# Patient Record
Sex: Female | Born: 1935 | Race: Black or African American | Hispanic: No | Smoking: Never smoker
Health system: Southern US, Community
[De-identification: ages and names within clinical notes are randomized; demographics above are authoritative.]

## PROBLEM LIST (undated history)

## (undated) DIAGNOSIS — I10 Essential (primary) hypertension: Secondary | ICD-10-CM

## (undated) DIAGNOSIS — F32A Depression, unspecified: Secondary | ICD-10-CM

## (undated) DIAGNOSIS — D72819 Decreased white blood cell count, unspecified: Secondary | ICD-10-CM

## (undated) DIAGNOSIS — D539 Nutritional anemia, unspecified: Secondary | ICD-10-CM

## (undated) DIAGNOSIS — F329 Major depressive disorder, single episode, unspecified: Secondary | ICD-10-CM

## (undated) DIAGNOSIS — K219 Gastro-esophageal reflux disease without esophagitis: Secondary | ICD-10-CM

## (undated) DIAGNOSIS — D649 Anemia, unspecified: Secondary | ICD-10-CM

## (undated) HISTORY — DX: Essential (primary) hypertension: I10

## (undated) HISTORY — DX: Anemia, unspecified: D64.9

## (undated) HISTORY — DX: Gastro-esophageal reflux disease without esophagitis: K21.9

## (undated) HISTORY — DX: Depression, unspecified: F32.A

## (undated) HISTORY — DX: Major depressive disorder, single episode, unspecified: F32.9

---

## 2004-11-23 ENCOUNTER — Ambulatory Visit: Payer: Self-pay | Admitting: Internal Medicine

## 2006-01-25 ENCOUNTER — Ambulatory Visit: Payer: Self-pay | Admitting: Internal Medicine

## 2007-02-20 ENCOUNTER — Ambulatory Visit: Payer: Self-pay | Admitting: Internal Medicine

## 2015-09-09 DIAGNOSIS — Z23 Encounter for immunization: Secondary | ICD-10-CM | POA: Diagnosis not present

## 2015-12-01 DIAGNOSIS — R5383 Other fatigue: Secondary | ICD-10-CM | POA: Diagnosis not present

## 2015-12-01 DIAGNOSIS — Z23 Encounter for immunization: Secondary | ICD-10-CM | POA: Diagnosis not present

## 2015-12-01 DIAGNOSIS — I10 Essential (primary) hypertension: Secondary | ICD-10-CM | POA: Diagnosis not present

## 2015-12-01 DIAGNOSIS — Z0000 Encounter for general adult medical examination without abnormal findings: Secondary | ICD-10-CM | POA: Diagnosis not present

## 2015-12-08 DIAGNOSIS — R5383 Other fatigue: Secondary | ICD-10-CM | POA: Diagnosis not present

## 2015-12-08 DIAGNOSIS — I10 Essential (primary) hypertension: Secondary | ICD-10-CM | POA: Diagnosis not present

## 2015-12-08 DIAGNOSIS — Z23 Encounter for immunization: Secondary | ICD-10-CM | POA: Diagnosis not present

## 2015-12-08 DIAGNOSIS — Z0000 Encounter for general adult medical examination without abnormal findings: Secondary | ICD-10-CM | POA: Diagnosis not present

## 2016-06-29 DIAGNOSIS — I10 Essential (primary) hypertension: Secondary | ICD-10-CM | POA: Insufficient documentation

## 2016-06-29 DIAGNOSIS — F329 Major depressive disorder, single episode, unspecified: Secondary | ICD-10-CM | POA: Insufficient documentation

## 2016-06-29 DIAGNOSIS — F419 Anxiety disorder, unspecified: Secondary | ICD-10-CM

## 2016-06-29 DIAGNOSIS — Z636 Dependent relative needing care at home: Secondary | ICD-10-CM | POA: Insufficient documentation

## 2016-06-29 DIAGNOSIS — F32A Depression, unspecified: Secondary | ICD-10-CM | POA: Insufficient documentation

## 2016-06-29 DIAGNOSIS — Z0000 Encounter for general adult medical examination without abnormal findings: Secondary | ICD-10-CM | POA: Diagnosis not present

## 2016-06-29 DIAGNOSIS — F3341 Major depressive disorder, recurrent, in partial remission: Secondary | ICD-10-CM | POA: Diagnosis not present

## 2016-07-06 DIAGNOSIS — I10 Essential (primary) hypertension: Secondary | ICD-10-CM | POA: Diagnosis not present

## 2016-07-06 DIAGNOSIS — Z636 Dependent relative needing care at home: Secondary | ICD-10-CM | POA: Diagnosis not present

## 2016-07-06 DIAGNOSIS — F329 Major depressive disorder, single episode, unspecified: Secondary | ICD-10-CM | POA: Diagnosis not present

## 2016-07-06 DIAGNOSIS — F419 Anxiety disorder, unspecified: Secondary | ICD-10-CM | POA: Diagnosis not present

## 2016-07-10 ENCOUNTER — Other Ambulatory Visit: Payer: Self-pay | Admitting: Internal Medicine

## 2016-07-10 DIAGNOSIS — R634 Abnormal weight loss: Secondary | ICD-10-CM

## 2016-07-10 DIAGNOSIS — D649 Anemia, unspecified: Secondary | ICD-10-CM

## 2016-07-21 ENCOUNTER — Ambulatory Visit: Admission: RE | Admit: 2016-07-21 | Payer: Commercial Managed Care - HMO | Source: Ambulatory Visit

## 2016-07-21 ENCOUNTER — Other Ambulatory Visit: Payer: Commercial Managed Care - HMO

## 2016-07-27 ENCOUNTER — Ambulatory Visit
Admission: RE | Admit: 2016-07-27 | Discharge: 2016-07-27 | Disposition: A | Discharge status: Home / Self Care | Payer: Commercial Managed Care - HMO | Source: Ambulatory Visit | Attending: Internal Medicine | Admitting: Internal Medicine

## 2016-07-27 DIAGNOSIS — R634 Abnormal weight loss: Secondary | ICD-10-CM | POA: Diagnosis not present

## 2016-07-27 DIAGNOSIS — D649 Anemia, unspecified: Secondary | ICD-10-CM | POA: Insufficient documentation

## 2016-07-27 DIAGNOSIS — M5137 Other intervertebral disc degeneration, lumbosacral region: Secondary | ICD-10-CM | POA: Insufficient documentation

## 2016-07-27 DIAGNOSIS — R195 Other fecal abnormalities: Secondary | ICD-10-CM | POA: Insufficient documentation

## 2016-07-27 MED ORDER — IOPAMIDOL (ISOVUE-300) INJECTION 61%
100.0000 mL | Freq: Once | INTRAVENOUS | Status: AC | PRN
  Administered 2016-07-27: 100 mL via INTRAVENOUS

## 2016-08-02 DIAGNOSIS — Z23 Encounter for immunization: Secondary | ICD-10-CM | POA: Diagnosis not present

## 2016-10-26 DIAGNOSIS — F418 Other specified anxiety disorders: Secondary | ICD-10-CM | POA: Diagnosis not present

## 2016-10-26 DIAGNOSIS — D649 Anemia, unspecified: Secondary | ICD-10-CM | POA: Insufficient documentation

## 2016-10-26 DIAGNOSIS — L853 Xerosis cutis: Secondary | ICD-10-CM | POA: Diagnosis not present

## 2016-10-26 DIAGNOSIS — Z636 Dependent relative needing care at home: Secondary | ICD-10-CM | POA: Diagnosis not present

## 2016-10-26 DIAGNOSIS — I10 Essential (primary) hypertension: Secondary | ICD-10-CM | POA: Diagnosis not present

## 2017-03-02 DIAGNOSIS — Z636 Dependent relative needing care at home: Secondary | ICD-10-CM | POA: Diagnosis not present

## 2017-03-02 DIAGNOSIS — Z0000 Encounter for general adult medical examination without abnormal findings: Secondary | ICD-10-CM | POA: Diagnosis not present

## 2017-03-02 DIAGNOSIS — F419 Anxiety disorder, unspecified: Secondary | ICD-10-CM | POA: Diagnosis not present

## 2017-03-02 DIAGNOSIS — L282 Other prurigo: Secondary | ICD-10-CM | POA: Diagnosis not present

## 2017-03-02 DIAGNOSIS — D649 Anemia, unspecified: Secondary | ICD-10-CM | POA: Diagnosis not present

## 2017-03-02 DIAGNOSIS — F329 Major depressive disorder, single episode, unspecified: Secondary | ICD-10-CM | POA: Diagnosis not present

## 2017-03-02 DIAGNOSIS — I10 Essential (primary) hypertension: Secondary | ICD-10-CM | POA: Diagnosis not present

## 2017-03-05 DIAGNOSIS — I10 Essential (primary) hypertension: Secondary | ICD-10-CM | POA: Diagnosis not present

## 2017-03-05 DIAGNOSIS — Z636 Dependent relative needing care at home: Secondary | ICD-10-CM | POA: Diagnosis not present

## 2017-03-05 DIAGNOSIS — D582 Other hemoglobinopathies: Secondary | ICD-10-CM | POA: Diagnosis not present

## 2017-03-05 DIAGNOSIS — F419 Anxiety disorder, unspecified: Secondary | ICD-10-CM | POA: Diagnosis not present

## 2017-03-05 DIAGNOSIS — D649 Anemia, unspecified: Secondary | ICD-10-CM | POA: Diagnosis not present

## 2017-03-05 DIAGNOSIS — L282 Other prurigo: Secondary | ICD-10-CM | POA: Diagnosis not present

## 2017-03-05 DIAGNOSIS — Z0000 Encounter for general adult medical examination without abnormal findings: Secondary | ICD-10-CM | POA: Diagnosis not present

## 2017-03-05 DIAGNOSIS — F329 Major depressive disorder, single episode, unspecified: Secondary | ICD-10-CM | POA: Diagnosis not present

## 2017-03-13 ENCOUNTER — Inpatient Hospital Stay: Payer: Medicare HMO | Admitting: Hematology and Oncology

## 2017-11-02 DIAGNOSIS — I10 Essential (primary) hypertension: Secondary | ICD-10-CM | POA: Diagnosis not present

## 2017-11-02 DIAGNOSIS — D649 Anemia, unspecified: Secondary | ICD-10-CM | POA: Diagnosis not present

## 2017-11-02 DIAGNOSIS — K219 Gastro-esophageal reflux disease without esophagitis: Secondary | ICD-10-CM | POA: Diagnosis not present

## 2017-11-15 NOTE — Progress Notes (Signed)
Hematology/Oncology Consult note [facility] Telephone:([phone] [phone]   Patient Care Team: Barbette Reichmann, MD as PCP - General (Internal Medicine)  REFERRING PROVIDER: Barbette Reichmann, MD and Debbra Riding CHIEF COMPLAINTS/PURPOSE OF CONSULTATION:  Evaluation of low WBC and anemia.   HISTORY OF PRESENTING ILLNESS:  Martha Butler is a  83 y.o.  female with PMH listed below who was referred to me for evaluation of low white count and anemia. Patient reports that she is been feeling tired recently quite stressed. Her husband passed away approximately 1 month ago. She lives by herself. Doesn't have good appetite. Denies any fever or chills, bleeding, chest pain, shortness of breath, abdominal pain. She reports that she may have lost weight since she's not been eating very well recently. Patient is accompanied by her grandson to clinic today.   Review of Systems  Constitutional: Negative for chills and fever.  HENT: Negative for hearing loss.   Eyes: Negative for blurred vision and photophobia.  Respiratory: Negative for cough.   Cardiovascular: Negative for chest pain and orthopnea.  Gastrointestinal: Negative for heartburn, nausea and vomiting.  Genitourinary: Negative for dysuria.  Musculoskeletal: Negative for myalgias.  Skin: Negative for rash.  Neurological: Negative for dizziness.  Endo/Heme/Allergies: Does not bruise/bleed easily.  Psychiatric/Behavioral: The patient does not have insomnia.        Feels not having much interest of doing anything.    MEDICAL HISTORY:  Past Medical History:  Diagnosis Date  . Anemia   . Depression   . GERD (gastroesophageal reflux disease)   . Hypertension     SURGICAL HISTORY: History reviewed. No pertinent surgical history.  SOCIAL HISTORY: Social History   Socioeconomic History  . Marital status: Married    Spouse name: Not on file  . Number of children: Not on file  . Years of  education: Not on file  . Highest education level: Not on file  Social Needs  . Financial resource strain: Not on file  . Food insecurity - worry: Not on file  . Food insecurity - inability: Not on file  . Transportation needs - medical: Not on file  . Transportation needs - non-medical: Not on file  Occupational History  . Not on file  Tobacco Use  . Smoking status: Never Smoker  . Smokeless tobacco: Never Used  Substance and Sexual Activity  . Alcohol use: No    Frequency: Never  . Drug use: No  . Sexual activity: Not on file  Other Topics Concern  . Not on file  Social History Narrative  . Not on file    FAMILY HISTORY: History reviewed. No pertinent family history.  ALLERGIES:  has No Known Allergies.  MEDICATIONS:  Current Outpatient Medications  Medication Sig Dispense Refill  . aspirin EC 81 MG tablet Take 1 tablet by mouth daily.    Marland Kitchen omeprazole (PRILOSEC) 20 MG capsule Take 1 capsule by mouth daily.     No current facility-administered medications for this visit.      PHYSICAL EXAMINATION: ECOG PERFORMANCE STATUS: 1 - Symptomatic but completely ambulatory Vitals:   11/16/17 1040 11/16/17 1049  BP:  133/70  Pulse:  91  Resp: 16   Temp:  98.8 F (37.1 C)   Filed Weights   11/16/17 1040  Weight: 136 lb 12.8 oz (62.1 kg)    Physical Exam  Constitutional: She is oriented to person, place, and time and well-developed, well-nourished, and in no distress. No distress.  HENT:  Head:  Normocephalic and atraumatic.  Mouth/Throat: No oropharyngeal exudate.  Eyes: EOM are normal. Pupils are equal, round, and reactive to light. No scleral icterus.  Neck: Normal range of motion. Neck supple.  Cardiovascular: Normal rate and regular rhythm.  Systolic murmur  Pulmonary/Chest: Effort normal and breath sounds normal.  Abdominal: Soft. Bowel sounds are normal. She exhibits no distension.  Musculoskeletal: Normal range of motion. She exhibits no edema.    Lymphadenopathy:    She has no cervical adenopathy.  Neurological: She is alert and oriented to person, place, and time.  Skin: Skin is warm and dry.  Psychiatric: Affect normal.     LABORATORY DATA:  I have reviewed the data as listed Lab Results  Component Value Date   WBC 2.0 (L) 11/16/2017   HGB 8.1 (L) 11/16/2017   HCT 24.9 (L) 11/16/2017   MCV 99.4 11/16/2017   PLT 200 11/16/2017   No results for input(s): NA, K, CL, CO2, GLUCOSE, BUN, CREATININE, CALCIUM, GFRNONAA, GFRAA, PROT, ALBUMIN, AST, ALT, ALKPHOS, BILITOT, BILIDIR, IBILI in the last 8760 hours.     ASSESSMENT & PLAN:  1. Anemia, unspecified type   2. Neutropenia, unspecified type (HCC)   3. Lymphopenia    Anemia: multifactorial with possible causes including chronic blood loss, hyper/hypothyroidism, nutritional deficiency, infection/chronic inflammation, hemolysis, underlying bone marrow disorders. Will check CBC w differential, CMP, vitamin B12, Folate, iron/TIBC, ferritin, reticulocytes, fecal occult, UA, blood smear, TSH,  LDH, monoclonal gammopathy evaluation.    Neutropenia/Lymphopenia: check HIV, hepatitis, flowcytometry.   All questions were answered. The patient knows to call the clinic with any problems questions or concerns.  Return of visit: 7-10 days to discuss results.  Thank you for this kind referral and the opportunity to participate in the care of this patient. A copy of today's note is routed to referring provider    Rickard PatienceZhou Demani Weyrauch, MD, PhD Hematology Oncology [facility] Pager- [phone] 11/16/2017

## 2017-11-16 ENCOUNTER — Inpatient Hospital Stay: Payer: Medicare HMO | Attending: Oncology | Admitting: Oncology

## 2017-11-16 ENCOUNTER — Inpatient Hospital Stay: Payer: Medicare HMO | Admitting: *Deleted

## 2017-11-16 ENCOUNTER — Other Ambulatory Visit: Payer: Self-pay

## 2017-11-16 ENCOUNTER — Encounter: Payer: Self-pay | Admitting: Oncology

## 2017-11-16 VITALS — BP 133/70 | HR 91 | Temp 98.8°F | Resp 16 | Ht 67.0 in | Wt 136.8 lb

## 2017-11-16 DIAGNOSIS — Z7982 Long term (current) use of aspirin: Secondary | ICD-10-CM | POA: Diagnosis not present

## 2017-11-16 DIAGNOSIS — E039 Hypothyroidism, unspecified: Secondary | ICD-10-CM | POA: Diagnosis not present

## 2017-11-16 DIAGNOSIS — D72810 Lymphocytopenia: Secondary | ICD-10-CM | POA: Diagnosis not present

## 2017-11-16 DIAGNOSIS — D649 Anemia, unspecified: Secondary | ICD-10-CM

## 2017-11-16 DIAGNOSIS — F329 Major depressive disorder, single episode, unspecified: Secondary | ICD-10-CM | POA: Insufficient documentation

## 2017-11-16 DIAGNOSIS — Z79899 Other long term (current) drug therapy: Secondary | ICD-10-CM | POA: Diagnosis not present

## 2017-11-16 DIAGNOSIS — I10 Essential (primary) hypertension: Secondary | ICD-10-CM | POA: Diagnosis not present

## 2017-11-16 DIAGNOSIS — D709 Neutropenia, unspecified: Secondary | ICD-10-CM | POA: Insufficient documentation

## 2017-11-16 DIAGNOSIS — R5383 Other fatigue: Secondary | ICD-10-CM | POA: Insufficient documentation

## 2017-11-16 LAB — FOLATE: FOLATE: 24 ng/mL (ref >5.9)

## 2017-11-16 LAB — FERRITIN: FERRITIN: 100 ng/mL (ref 11–307)

## 2017-11-16 LAB — COMPREHENSIVE METABOLIC PANEL
ALBUMIN: 4.2 g/dL (ref 3.5–5.0)
ALT: 14 U/L (ref 14–54)
ANION GAP: 10 (ref 5–15)
AST: 21 U/L (ref 15–41)
Alkaline Phosphatase: 55 U/L (ref 38–126)
BUN: 17 mg/dL (ref 6–20)
CO2: 27 mmol/L (ref 22–32)
Calcium: 9.2 mg/dL (ref 8.9–10.3)
Chloride: 104 mmol/L (ref 101–111)
Creatinine, Ser: 1.06 mg/dL — ABNORMAL HIGH (ref 0.44–1.00)
GFR calc Af Amer: 56 mL/min — ABNORMAL LOW (ref >60)
GFR calc non Af Amer: 48 mL/min — ABNORMAL LOW (ref >60)
GLUCOSE: 102 mg/dL — AB (ref 65–99)
POTASSIUM: 3.9 mmol/L (ref 3.5–5.1)
Sodium: 141 mmol/L (ref 135–145)
TOTAL PROTEIN: 8.2 g/dL — AB (ref 6.5–8.1)
Total Bilirubin: 1 mg/dL (ref 0.3–1.2)

## 2017-11-16 LAB — IRON AND TIBC
Iron: 94 ug/dL (ref 28–170)
Saturation Ratios: 29 % (ref 10.4–31.8)
TIBC: 322 ug/dL (ref 250–450)
UIBC: 228 ug/dL

## 2017-11-16 LAB — CBC WITH DIFFERENTIAL/PLATELET
BASOS ABS: 0.1 10*3/uL (ref 0–0.1)
Band Neutrophils: 8 %
Basophils Relative: 4 %
EOS ABS: 0 10*3/uL (ref 0–0.7)
Eosinophils Relative: 1 %
HCT: 24.9 % — ABNORMAL LOW (ref 35.0–47.0)
Hemoglobin: 8.1 g/dL — ABNORMAL LOW (ref 12.0–16.0)
LYMPHS ABS: 0.7 10*3/uL — AB (ref 1.0–3.6)
Lymphocytes Relative: 36 %
MCH: 32.4 pg (ref 26.0–34.0)
MCHC: 32.6 g/dL (ref 32.0–36.0)
MCV: 99.4 fL (ref 80.0–100.0)
MONO ABS: 0.1 10*3/uL — AB (ref 0.2–0.9)
Monocytes Relative: 7 %
NEUTROS ABS: 1.1 10*3/uL — AB (ref 1.4–6.5)
NEUTROS PCT: 44 %
Platelets: 200 10*3/uL (ref 150–440)
RBC: 2.5 MIL/uL — ABNORMAL LOW (ref 3.80–5.20)
RDW: 15.3 % — AB (ref 11.5–14.5)
WBC: 2 10*3/uL — AB (ref 3.6–11.0)

## 2017-11-16 LAB — PATHOLOGIST SMEAR REVIEW

## 2017-11-16 LAB — TSH: TSH: 2.627 u[IU]/mL (ref 0.350–4.500)

## 2017-11-16 LAB — LACTATE DEHYDROGENASE: LDH: 133 U/L (ref 98–192)

## 2017-11-16 NOTE — Progress Notes (Signed)
Patient here for initial visit. She reports feeling tired at times, denies dyspnea,.

## 2017-11-17 LAB — HEPATITIS PANEL, ACUTE
HCV Ab: <0.1 s/co ratio (ref 0.0–0.9)
Hep A IgM: NEGATIVE
Hep B C IgM: NEGATIVE
Hepatitis B Surface Ag: NEGATIVE

## 2017-11-17 LAB — HIV ANTIBODY (ROUTINE TESTING W REFLEX): HIV Screen 4th Generation wRfx: NONREACTIVE

## 2017-11-17 LAB — VITAMIN B12: VITAMIN B 12: 828 pg/mL (ref 180–914)

## 2017-11-19 LAB — COMP PANEL: LEUKEMIA/LYMPHOMA

## 2017-11-19 LAB — KAPPA/LAMBDA LIGHT CHAINS
KAPPA, LAMDA LIGHT CHAIN RATIO: 1.75 — AB (ref 0.26–1.65)
Kappa free light chain: 55.3 mg/L — ABNORMAL HIGH (ref 3.3–19.4)
LAMDA FREE LIGHT CHAINS: 31.6 mg/L — AB (ref 5.7–26.3)

## 2017-11-20 LAB — PROTEIN ELECTROPHORESIS, SERUM
A/G RATIO SPE: 0.9 (ref 0.7–1.7)
ALBUMIN ELP: 3.8 g/dL (ref 2.9–4.4)
Alpha-1-Globulin: 0.3 g/dL (ref 0.0–0.4)
Alpha-2-Globulin: 0.6 g/dL (ref 0.4–1.0)
BETA GLOBULIN: 1.2 g/dL (ref 0.7–1.3)
GLOBULIN, TOTAL: 4.1 g/dL — AB (ref 2.2–3.9)
Gamma Globulin: 2 g/dL — ABNORMAL HIGH (ref 0.4–1.8)
TOTAL PROTEIN ELP: 7.9 g/dL (ref 6.0–8.5)

## 2017-11-27 ENCOUNTER — Other Ambulatory Visit: Payer: Self-pay

## 2017-11-27 ENCOUNTER — Encounter: Payer: Self-pay | Admitting: Oncology

## 2017-11-27 ENCOUNTER — Other Ambulatory Visit: Payer: Self-pay | Admitting: *Deleted

## 2017-11-27 ENCOUNTER — Inpatient Hospital Stay (HOSPITAL_BASED_OUTPATIENT_CLINIC_OR_DEPARTMENT_OTHER): Payer: Medicare HMO | Admitting: Oncology

## 2017-11-27 VITALS — BP 129/66 | HR 87 | Temp 98.7°F | Wt 135.4 lb

## 2017-11-27 DIAGNOSIS — F329 Major depressive disorder, single episode, unspecified: Secondary | ICD-10-CM | POA: Diagnosis not present

## 2017-11-27 DIAGNOSIS — D709 Neutropenia, unspecified: Secondary | ICD-10-CM | POA: Diagnosis not present

## 2017-11-27 DIAGNOSIS — D72810 Lymphocytopenia: Secondary | ICD-10-CM | POA: Diagnosis not present

## 2017-11-27 DIAGNOSIS — D649 Anemia, unspecified: Secondary | ICD-10-CM

## 2017-11-27 DIAGNOSIS — I10 Essential (primary) hypertension: Secondary | ICD-10-CM | POA: Diagnosis not present

## 2017-11-27 DIAGNOSIS — Z79899 Other long term (current) drug therapy: Secondary | ICD-10-CM | POA: Diagnosis not present

## 2017-11-27 DIAGNOSIS — Z7982 Long term (current) use of aspirin: Secondary | ICD-10-CM | POA: Diagnosis not present

## 2017-11-27 DIAGNOSIS — E039 Hypothyroidism, unspecified: Secondary | ICD-10-CM | POA: Diagnosis not present

## 2017-11-27 DIAGNOSIS — R5383 Other fatigue: Secondary | ICD-10-CM | POA: Diagnosis not present

## 2017-11-27 LAB — URINALYSIS, COMPLETE (UACMP) WITH MICROSCOPIC
BILIRUBIN URINE: NEGATIVE
Glucose, UA: NEGATIVE mg/dL
Hgb urine dipstick: NEGATIVE
KETONES UR: NEGATIVE mg/dL
NITRITE: NEGATIVE
Protein, ur: NEGATIVE mg/dL
SPECIFIC GRAVITY, URINE: 1.011 (ref 1.005–1.030)
pH: 5 (ref 5.0–8.0)

## 2017-11-27 NOTE — Progress Notes (Signed)
Patient here today for follow up.  Patient states no new concerns today  

## 2017-11-27 NOTE — Progress Notes (Signed)
Hematology/Oncology Follow up note [facility] Telephone:([phone] Fax:(336) [phone]   Patient Care Team: Tracie Harrier, MD as PCP - General (Internal Medicine)  REFERRING PROVIDER: Tracie Harrier, MD and Whitaker, [place] VISIT Follow up for management of  low WBC and anemia.   HISTORY OF PRESENTING ILLNESS:  Martha Butler is a  83 y.o.  female with PMH listed below who was referred to me for evaluation of low white count and anemia. Patient reports that she is been feeling tired recently quite stressed. Her husband passed away approximately 1 month ago. She lives by herself. Doesn't have good appetite.   INTERVAL HISTORY Martha Butler is a 83 y.o. female who has above history reviewed by me today presents for follow up visit for management of cytopenia. She is accompanied by her grandson.  She denies any new complaints. Denies any fever or chills, bleeding, chest pain, shortness of breath, abdominal pain. She reports that she may have lost weight since she's not been eating very well recently. Appetite is fair.  Review of Systems  Constitutional: Negative for chills and fever.  HENT: Negative for hearing loss.   Eyes: Negative for blurred vision and photophobia.  Respiratory: Negative for cough.   Cardiovascular: Negative for chest pain and orthopnea.  Gastrointestinal: Negative for heartburn, nausea and vomiting.  Genitourinary: Negative for dysuria.  Musculoskeletal: Negative for myalgias.  Skin: Negative for rash.  Neurological: Negative for dizziness and tingling.  Endo/Heme/Allergies: Does not bruise/bleed easily.  Psychiatric/Behavioral: The patient does not have insomnia.        Feels not having much interest of doing anything.    MEDICAL HISTORY:  Past Medical History:  Diagnosis Date  . Anemia   . Depression   . GERD (gastroesophageal reflux disease)   . Hypertension     SURGICAL HISTORY: History reviewed. No  pertinent surgical history.  SOCIAL HISTORY: Social History   Socioeconomic History  . Marital status: Married    Spouse name: Not on file  . Number of children: Not on file  . Years of education: Not on file  . Highest education level: Not on file  Social Needs  . Financial resource strain: Not on file  . Food insecurity - worry: Not on file  . Food insecurity - inability: Not on file  . Transportation needs - medical: Not on file  . Transportation needs - non-medical: Not on file  Occupational History  . Not on file  Tobacco Use  . Smoking status: Never Smoker  . Smokeless tobacco: Never Used  Substance and Sexual Activity  . Alcohol use: No    Frequency: Never  . Drug use: No  . Sexual activity: Not on file  Other Topics Concern  . Not on file  Social History Narrative  . Not on file    FAMILY HISTORY: History reviewed. No pertinent family history.  ALLERGIES:  has No Known Allergies.  MEDICATIONS:  Current Outpatient Medications  Medication Sig Dispense Refill  . aspirin EC 81 MG tablet Take 1 tablet by mouth daily.    Marland Kitchen omeprazole (PRILOSEC) 20 MG capsule Take 1 capsule by mouth daily.     No current facility-administered medications for this visit.      PHYSICAL EXAMINATION: ECOG PERFORMANCE STATUS: 1 - Symptomatic but completely ambulatory Vitals:   11/27/17 1149  BP: 129/66  Pulse: 87  Temp: 98.7 F (37.1 C)   Filed Weights   11/27/17 1149  Weight: 135 lb 6 oz (  61.4 kg)    Physical Exam  Constitutional: She is oriented to person, place, and time and well-developed, well-nourished, and in no distress. No distress.  HENT:  Head: Normocephalic and atraumatic.  Mouth/Throat: No oropharyngeal exudate.  Eyes: EOM are normal. Pupils are equal, round, and reactive to light. Right eye exhibits no discharge. No scleral icterus.  Neck: Normal range of motion. Neck supple.  Cardiovascular: Normal rate, regular rhythm and normal heart sounds.  No  murmur heard. Systolic murmur  Pulmonary/Chest: Effort normal and breath sounds normal. She has no wheezes.  Abdominal: Soft. Bowel sounds are normal. She exhibits no distension.  Musculoskeletal: Normal range of motion. She exhibits no edema.  Lymphadenopathy:    She has no cervical adenopathy.  Neurological: She is alert and oriented to person, place, and time.  Skin: Skin is warm and dry. No erythema.  Psychiatric: Affect and judgment normal.     LABORATORY DATA:  I have reviewed the data as listed Lab Results  Component Value Date   WBC 2.0 (L) 11/16/2017   HGB 8.1 (L) 11/16/2017   HCT 24.9 (L) 11/16/2017   MCV 99.4 11/16/2017   PLT 200 11/16/2017   Recent Labs    11/16/17 1202  NA 141  K 3.9  CL 104  CO2 27  GLUCOSE 102*  BUN 17  CREATININE 1.06*  CALCIUM 9.2  GFRNONAA 48*  GFRAA 56*  PROT 8.2*  ALBUMIN 4.2  AST 21  ALT 14  ALKPHOS 55  BILITOT 1.0    Normal vitamin B12, Folate, iron/TIBC, ferritin, TSH normal. No monoclonal protein. Normal LDH, TSH.   ASSESSMENT & PLAN:  1. Anemia, unspecified type   2. Neutropenia, unspecified type ([place])   3. Lymphopenia    Discussed with patient and her grandson that so far peripheral blood work up is non conclusive.  Likely patient has underlying bone marrow disorder, ie MDS.  Discussed with patient that to better evaluate bone marrow, obtaining bone marrow biopsy is recommend, however, even if we confirmed that she has MDS, treatment plan will be mostly supportive care +/- hypomethylation agent.   Patient does not want to have bone marrow biopsy which I think is reasonable. She is asymptomatic for now. She can continue to follow up with me every 3 months and if her cytopenia continue to worsen, can consider G CSFs.   All questions were answered. The patient knows to call the clinic with any problems questions or concerns.  Return of visit: 3 months with repeat cbc.  Thank you for this kind referral and the  opportunity to participate in the care of this patient. A copy of today's note is routed to referring provider    Earlie Server, MD, PhD Hematology Oncology [facility] at [facility] Pager- [phone] 11/27/2017

## 2018-01-15 ENCOUNTER — Other Ambulatory Visit: Payer: Self-pay | Admitting: Internal Medicine

## 2018-01-15 DIAGNOSIS — D649 Anemia, unspecified: Secondary | ICD-10-CM | POA: Diagnosis not present

## 2018-01-15 DIAGNOSIS — Z0000 Encounter for general adult medical examination without abnormal findings: Secondary | ICD-10-CM | POA: Diagnosis not present

## 2018-01-15 DIAGNOSIS — Z1231 Encounter for screening mammogram for malignant neoplasm of breast: Secondary | ICD-10-CM | POA: Diagnosis not present

## 2018-01-15 DIAGNOSIS — I499 Cardiac arrhythmia, unspecified: Secondary | ICD-10-CM | POA: Diagnosis not present

## 2018-01-15 DIAGNOSIS — F321 Major depressive disorder, single episode, moderate: Secondary | ICD-10-CM | POA: Diagnosis not present

## 2018-01-15 DIAGNOSIS — Z780 Asymptomatic menopausal state: Secondary | ICD-10-CM | POA: Diagnosis not present

## 2018-01-15 DIAGNOSIS — Z1239 Encounter for other screening for malignant neoplasm of breast: Secondary | ICD-10-CM

## 2018-01-18 DIAGNOSIS — I499 Cardiac arrhythmia, unspecified: Secondary | ICD-10-CM | POA: Diagnosis not present

## 2018-01-18 DIAGNOSIS — Z780 Asymptomatic menopausal state: Secondary | ICD-10-CM | POA: Diagnosis not present

## 2018-01-18 DIAGNOSIS — Z1231 Encounter for screening mammogram for malignant neoplasm of breast: Secondary | ICD-10-CM | POA: Diagnosis not present

## 2018-01-18 DIAGNOSIS — D649 Anemia, unspecified: Secondary | ICD-10-CM | POA: Diagnosis not present

## 2018-01-18 DIAGNOSIS — F321 Major depressive disorder, single episode, moderate: Secondary | ICD-10-CM | POA: Diagnosis not present

## 2018-01-18 DIAGNOSIS — Z0000 Encounter for general adult medical examination without abnormal findings: Secondary | ICD-10-CM | POA: Diagnosis not present

## 2018-01-21 DIAGNOSIS — D649 Anemia, unspecified: Secondary | ICD-10-CM | POA: Diagnosis not present

## 2018-01-23 DIAGNOSIS — M8588 Other specified disorders of bone density and structure, other site: Secondary | ICD-10-CM | POA: Diagnosis not present

## 2018-02-26 ENCOUNTER — Other Ambulatory Visit: Payer: Self-pay

## 2018-02-26 ENCOUNTER — Inpatient Hospital Stay (HOSPITAL_BASED_OUTPATIENT_CLINIC_OR_DEPARTMENT_OTHER): Payer: Medicare HMO | Admitting: Oncology

## 2018-02-26 ENCOUNTER — Inpatient Hospital Stay: Payer: Medicare HMO | Attending: Oncology

## 2018-02-26 ENCOUNTER — Other Ambulatory Visit
Admission: RE | Admit: 2018-02-26 | Discharge: 2018-02-26 | Disposition: A | Discharge status: Home / Self Care | Payer: Medicare HMO | Source: Ambulatory Visit | Attending: Oncology | Admitting: Oncology

## 2018-02-26 ENCOUNTER — Encounter: Payer: Self-pay | Admitting: Oncology

## 2018-02-26 VITALS — BP 126/70 | HR 93 | Temp 97.2°F | Resp 18 | Wt 142.0 lb

## 2018-02-26 DIAGNOSIS — D709 Neutropenia, unspecified: Secondary | ICD-10-CM | POA: Insufficient documentation

## 2018-02-26 DIAGNOSIS — D649 Anemia, unspecified: Secondary | ICD-10-CM | POA: Diagnosis not present

## 2018-02-26 DIAGNOSIS — D72810 Lymphocytopenia: Secondary | ICD-10-CM | POA: Insufficient documentation

## 2018-02-26 DIAGNOSIS — I10 Essential (primary) hypertension: Secondary | ICD-10-CM | POA: Insufficient documentation

## 2018-02-26 LAB — CBC WITH DIFFERENTIAL/PLATELET
BASOS PCT: 0 %
Band Neutrophils: 2 %
Basophils Absolute: 0 10*3/uL (ref 0–0.1)
Blasts: 0 %
EOS PCT: 1 %
Eosinophils Absolute: 0 10*3/uL (ref 0–0.7)
HCT: 28.1 % — ABNORMAL LOW (ref 35.0–47.0)
Hemoglobin: 9.2 g/dL — ABNORMAL LOW (ref 12.0–16.0)
LYMPHS ABS: 1.2 10*3/uL (ref 1.0–3.6)
LYMPHS PCT: 54 %
MCH: 32.8 pg (ref 26.0–34.0)
MCHC: 32.8 g/dL (ref 32.0–36.0)
MCV: 99.9 fL (ref 80.0–100.0)
MONO ABS: 0.3 10*3/uL (ref 0.2–0.9)
MONOS PCT: 12 %
Metamyelocytes Relative: 0 %
Myelocytes: 0 %
NEUTROS ABS: 0.8 10*3/uL — AB (ref 1.4–6.5)
NEUTROS PCT: 31 %
NRBC: 0 /100{WBCs}
OTHER: 0 %
PLATELETS: 208 10*3/uL (ref 150–440)
Promyelocytes Relative: 0 %
RBC: 2.81 MIL/uL — AB (ref 3.80–5.20)
RDW: 15.1 % — AB (ref 11.5–14.5)
WBC: 2.3 10*3/uL — AB (ref 3.6–11.0)

## 2018-02-26 MED ORDER — CIPROFLOXACIN HCL 250 MG PO TABS: 250.0000 mg | ORAL_TABLET | Freq: Two times a day (BID) | ORAL | 1 refills | Status: DC

## 2018-02-26 NOTE — Progress Notes (Signed)
Patient here for follow up. No concerns voiced.  °

## 2018-02-26 NOTE — Progress Notes (Signed)
Hematology/Oncology Follow up note [name] Telephone:([phone] Fax:(336) [phone]   Patient Care Team: Tracie Harrier, MD as PCP - General (Internal Medicine)  REFERRING PROVIDER: Tracie Harrier, MD and Whitaker, [place] VISIT Follow up for management of leukopenia and anemia.   HISTORY OF PRESENTING ILLNESS:  Martha Butler is a  83 y.o.  female with PMH listed below who was referred to me for evaluation of low white count and anemia. Patient reports that she is been feeling tired recently quite stressed. Her husband passed away approximately 1 month ago. She lives by herself. Doesn't have good appetite.   INTERVAL HISTORY ADDELINE CALARCO is a 83 y.o. female who has above history reviewed by me today presents for follow up visit for management of cytopenia.  Patient is accompanied by her grandson.  Denies any new complaints.  Denies any fever or chills, bleeding, chest pain, shortness of breath, abdominal pain.  Denies any frequent infection, bleeding events.  Appetite is fair.  Review of Systems  Constitutional: Positive for malaise/fatigue. Negative for chills, fever and weight loss.  HENT: Negative for congestion, ear discharge, ear pain, hearing loss, nosebleeds, sinus pain and sore throat.   Eyes: Negative for blurred vision, double vision, photophobia, pain, discharge and redness.  Respiratory: Negative for cough, hemoptysis, sputum production, shortness of breath and wheezing.   Cardiovascular: Negative for chest pain, palpitations, orthopnea, claudication and leg swelling.  Gastrointestinal: Negative for abdominal pain, blood in stool, constipation, diarrhea, heartburn, melena, nausea and vomiting.  Genitourinary: Negative for dysuria, flank pain, frequency and hematuria.  Musculoskeletal: Negative for back pain, myalgias and neck pain.  Skin: Negative for itching and rash.  Neurological: Negative for dizziness, tingling, tremors,  focal weakness, weakness and headaches.  Endo/Heme/Allergies: Negative for environmental allergies. Does not bruise/bleed easily.  Psychiatric/Behavioral: Negative for depression, hallucinations, substance abuse and suicidal ideas. The patient is not nervous/anxious and does not have insomnia.        Feels not having much interest of doing anything.    MEDICAL HISTORY:  Past Medical History:  Diagnosis Date  . Anemia   . Depression   . GERD (gastroesophageal reflux disease)   . Hypertension     SURGICAL HISTORY: No past surgical history on file.  SOCIAL HISTORY: Social History   Socioeconomic History  . Marital status: Married    Spouse name: Not on file  . Number of children: Not on file  . Years of education: Not on file  . Highest education level: Not on file  Occupational History  . Not on file  Social Needs  . Financial resource strain: Not on file  . Food insecurity:    Worry: Not on file    Inability: Not on file  . Transportation needs:    Medical: Not on file    Non-medical: Not on file  Tobacco Use  . Smoking status: Never Smoker  . Smokeless tobacco: Never Used  Substance and Sexual Activity  . Alcohol use: No    Frequency: Never  . Drug use: No  . Sexual activity: Not on file  Lifestyle  . Physical activity:    Days per week: Not on file    Minutes per session: Not on file  . Stress: Not on file  Relationships  . Social connections:    Talks on phone: Not on file    Gets together: Not on file    Attends religious service: Not on file    Active member  of club or organization: Not on file    Attends meetings of clubs or organizations: Not on file    Relationship status: Not on file  . Intimate partner violence:    Fear of current or ex partner: Not on file    Emotionally abused: Not on file    Physically abused: Not on file    Forced sexual activity: Not on file  Other Topics Concern  . Not on file  Social History Narrative  . Not on file     FAMILY HISTORY: No family history on file.  ALLERGIES:  has No Known Allergies.  MEDICATIONS:  Current Outpatient Medications  Medication Sig Dispense Refill  . aspirin EC 81 MG tablet Take 1 tablet by mouth daily.    Marland Kitchen omeprazole (PRILOSEC) 20 MG capsule Take 1 capsule by mouth daily.     No current facility-administered medications for this visit.      PHYSICAL EXAMINATION: ECOG PERFORMANCE STATUS: 1 - Symptomatic but completely ambulatory Vitals:   02/26/18 1150 02/26/18 1152  BP: 126/70   Pulse: 93   Resp: 18   Temp:  (!) 97.2 F (36.2 C)   Filed Weights   02/26/18 1150  Weight: 142 lb (64.4 kg)    Physical Exam  Constitutional: She is oriented to person, place, and time and well-developed, well-nourished, and in no distress. No distress.  HENT:  Head: Normocephalic and atraumatic.  Nose: Nose normal.  Mouth/Throat: Oropharynx is clear and moist. No oropharyngeal exudate.  Eyes: Pupils are equal, round, and reactive to light. EOM are normal. Right eye exhibits no discharge. Left eye exhibits no discharge. No scleral icterus.  Neck: Normal range of motion. Neck supple. No JVD present.  Cardiovascular: Normal rate and regular rhythm.  Murmur heard. Systolic murmur  Pulmonary/Chest: Effort normal and breath sounds normal. No respiratory distress. She has no wheezes. She has no rales. She exhibits no tenderness.  Abdominal: Soft. Bowel sounds are normal. She exhibits no distension and no mass. There is no tenderness. There is no rebound.  Musculoskeletal: Normal range of motion. She exhibits no edema or tenderness.  Lymphadenopathy:    She has no cervical adenopathy.  Neurological: She is alert and oriented to person, place, and time. No cranial nerve deficit. She exhibits normal muscle tone. Coordination normal.  Skin: Skin is warm and dry. No rash noted. She is not diaphoretic. No erythema.  Psychiatric: Affect and judgment normal.     LABORATORY DATA:  I  have reviewed the data as listed Lab Results  Component Value Date   WBC 2.0 (L) 11/16/2017   HGB 8.1 (L) 11/16/2017   HCT 24.9 (L) 11/16/2017   MCV 99.4 11/16/2017   PLT 200 11/16/2017   Recent Labs    11/16/17 1202  NA 141  K 3.9  CL 104  CO2 27  GLUCOSE 102*  BUN 17  CREATININE 1.06*  CALCIUM 9.2  GFRNONAA 48*  GFRAA 56*  PROT 8.2*  ALBUMIN 4.2  AST 21  ALT 14  ALKPHOS 55  BILITOT 1.0    Normal vitamin B12, Folate, iron/TIBC, ferritin, TSH normal. No monoclonal protein. Normal LDH, TSH.   ASSESSMENT & PLAN:  1. Anemia, unspecified type   2. Neutropenia, unspecified type ([place])   3. Lymphopenia     #Lab results were reviewed and discussed with patient and his grandson.  Hemoglobin has been stable.  ANC continue to drop to 0.8. Discussed again with patient and her grandson that so far the  peripheral blood work-up is nonconclusive, patient's clinical picture is suspicious for underlying bone marrow disorder, such as MDS.  Previously discussed bone marrow biopsy for confirmation and patient declined.  She hopes to being watched and to have supportive care if needed. Currently she is overall asymptomatic.  Discussed with patient and her son that I will start patient on Cipro 250 mg prophylactically for neutropenia.  If ANC drops below 0.5, will need to consider GC SF.  Advised patient to take yogurt.  Watch for signs of loose stool/diarrhea, fever or chills.  Patient needs to seek medical advice immediately if she develops such symptoms.  Patient and her son voices understanding.  All questions were answered. The patient knows to call the clinic with any problems questions or concerns.  Return of visit: 3 months with repeat cbc.  Wallene Huh, MD, PhD Hematology Oncology [facility] at [facility] Pager- [phone] 02/26/2018

## 2018-03-12 ENCOUNTER — Other Ambulatory Visit: Payer: Medicare HMO

## 2018-03-12 ENCOUNTER — Ambulatory Visit: Payer: Medicare HMO | Admitting: Oncology

## 2018-04-16 ENCOUNTER — Inpatient Hospital Stay: Payer: Medicare HMO

## 2018-04-16 ENCOUNTER — Inpatient Hospital Stay: Payer: Medicare HMO | Admitting: Oncology

## 2018-05-13 ENCOUNTER — Inpatient Hospital Stay: Payer: Medicare HMO

## 2018-05-13 ENCOUNTER — Inpatient Hospital Stay: Payer: Medicare HMO | Admitting: Oncology

## 2018-05-14 ENCOUNTER — Encounter: Payer: Self-pay | Admitting: Oncology

## 2018-05-14 ENCOUNTER — Other Ambulatory Visit: Payer: Self-pay

## 2018-05-14 ENCOUNTER — Inpatient Hospital Stay: Payer: Medicare HMO

## 2018-05-14 ENCOUNTER — Inpatient Hospital Stay: Payer: Medicare HMO | Attending: Oncology | Admitting: Oncology

## 2018-05-14 VITALS — BP 137/77 | HR 88 | Temp 97.3°F | Wt 144.0 lb

## 2018-05-14 DIAGNOSIS — Z7982 Long term (current) use of aspirin: Secondary | ICD-10-CM | POA: Diagnosis not present

## 2018-05-14 DIAGNOSIS — D649 Anemia, unspecified: Secondary | ICD-10-CM | POA: Diagnosis not present

## 2018-05-14 DIAGNOSIS — D709 Neutropenia, unspecified: Secondary | ICD-10-CM | POA: Insufficient documentation

## 2018-05-14 DIAGNOSIS — I10 Essential (primary) hypertension: Secondary | ICD-10-CM | POA: Diagnosis not present

## 2018-05-14 DIAGNOSIS — F329 Major depressive disorder, single episode, unspecified: Secondary | ICD-10-CM | POA: Insufficient documentation

## 2018-05-14 DIAGNOSIS — D72810 Lymphocytopenia: Secondary | ICD-10-CM

## 2018-05-14 LAB — CBC WITH DIFFERENTIAL/PLATELET
BLASTS: 0 %
Band Neutrophils: 0 %
Basophils Absolute: 0.1 10*3/uL (ref 0–0.1)
Basophils Relative: 5 %
EOS ABS: 0.1 10*3/uL (ref 0–0.7)
Eosinophils Relative: 2 %
HCT: 27.6 % — ABNORMAL LOW (ref 35.0–47.0)
HEMOGLOBIN: 9.1 g/dL — AB (ref 12.0–16.0)
LYMPHS PCT: 53 %
Lymphs Abs: 1.4 10*3/uL (ref 1.0–3.6)
MCH: 33.3 pg (ref 26.0–34.0)
MCHC: 32.9 g/dL (ref 32.0–36.0)
MCV: 101.1 fL — ABNORMAL HIGH (ref 80.0–100.0)
MYELOCYTES: 0 %
Metamyelocytes Relative: 0 %
Monocytes Absolute: 0.2 10*3/uL (ref 0.2–0.9)
Monocytes Relative: 8 %
NEUTROS PCT: 32 %
NRBC: 2 /100{WBCs} — AB
Neutro Abs: 0.9 10*3/uL — ABNORMAL LOW (ref 1.4–6.5)
OTHER: 0 %
PROMYELOCYTES RELATIVE: 0 %
Platelets: 192 10*3/uL (ref 150–440)
RBC: 2.74 MIL/uL — ABNORMAL LOW (ref 3.80–5.20)
RDW: 14.8 % — ABNORMAL HIGH (ref 11.5–14.5)
WBC: 2.7 10*3/uL — ABNORMAL LOW (ref 3.6–11.0)

## 2018-05-14 MED ORDER — CIPROFLOXACIN HCL 250 MG PO TABS: 250.0000 mg | ORAL_TABLET | Freq: Every day | ORAL | 2 refills | Status: DC

## 2018-05-14 NOTE — Progress Notes (Signed)
Patient here today for follow up.   

## 2018-05-15 NOTE — Progress Notes (Signed)
Hematology/Oncology Follow up note [facility] [place] Telephone:(336) [phone] Fax:(336) [phone]   Patient Care Team: Earlie Server, MD as PCP - General (Oncology)  REFERRING PROVIDER: Earlie Server, MD and Whitaker, [place] VISIT Follow up for management of leukopenia and anemia.   HISTORY OF PRESENTING ILLNESS:  Martha Butler is a  83 y.o.  female with PMH listed below who was referred to me for evaluation of low white count and anemia. Patient reports that she is been feeling tired recently quite stressed. Her husband passed away approximately 1 month ago. She lives by herself. Doesn't have good appetite.   INTERVAL HISTORY Martha Butler is a 83 y.o. female who has above history reviewed by me today presents for follow up visit for management of cytopenia.  Patient is accompanied by her grandson.  Denies any new complaints.  Denies any fever or chills, bleeding, chest pain, shortness of breath, abdominal pain.  Denies any frequent infection, bleeding events.  Appetite is fair.  Review of Systems  Constitutional: Positive for malaise/fatigue. Negative for chills, fever and weight loss.  HENT: Negative for congestion, ear discharge, ear pain, hearing loss, nosebleeds, sinus pain and sore throat.   Eyes: Negative for blurred vision, double vision, photophobia, pain, discharge and redness.  Respiratory: Negative for cough, hemoptysis and sputum production.   Cardiovascular: Negative for chest pain, palpitations and orthopnea.  Gastrointestinal: Negative for abdominal pain, diarrhea, heartburn, nausea and vomiting.  Genitourinary: Negative for dysuria and frequency.  Musculoskeletal: Negative for back pain, myalgias and neck pain.  Skin: Negative for itching and rash.  Neurological: Negative for dizziness, tingling, tremors, focal weakness, weakness and headaches.  Endo/Heme/Allergies: Negative for environmental allergies. Does not bruise/bleed easily.    Psychiatric/Behavioral: Negative for depression, hallucinations, substance abuse and suicidal ideas. The patient is not nervous/anxious and does not have insomnia.        Feels not having much interest of doing anything.    MEDICAL HISTORY:  Past Medical History:  Diagnosis Date  . Anemia   . Depression   . GERD (gastroesophageal reflux disease)   . Hypertension     SURGICAL HISTORY: History reviewed. No pertinent surgical history.  SOCIAL HISTORY: Social History   Socioeconomic History  . Marital status: Married    Spouse name: Not on file  . Number of children: Not on file  . Years of education: Not on file  . Highest education level: Not on file  Occupational History  . Not on file  Social Needs  . Financial resource strain: Not on file  . Food insecurity:    Worry: Not on file    Inability: Not on file  . Transportation needs:    Medical: Not on file    Non-medical: Not on file  Tobacco Use  . Smoking status: Never Smoker  . Smokeless tobacco: Never Used  Substance and Sexual Activity  . Alcohol use: No    Frequency: Never  . Drug use: No  . Sexual activity: Not on file  Lifestyle  . Physical activity:    Days per week: Not on file    Minutes per session: Not on file  . Stress: Not on file  Relationships  . Social connections:    Talks on phone: Not on file    Gets together: Not on file    Attends religious service: Not on file    Active member of club or organization: Not on file    Attends meetings of clubs or  organizations: Not on file    Relationship status: Not on file  . Intimate partner violence:    Fear of current or ex partner: Not on file    Emotionally abused: Not on file    Physically abused: Not on file    Forced sexual activity: Not on file  Other Topics Concern  . Not on file  Social History Narrative  . Not on file    FAMILY HISTORY: History reviewed. No pertinent family history.  ALLERGIES:  has No Known  Allergies.  MEDICATIONS:  Current Outpatient Medications  Medication Sig Dispense Refill  . aspirin EC 81 MG tablet Take 1 tablet by mouth daily.    . Blood Pressure Monitoring (BLOOD PRESSURE KIT) KIT     . ciprofloxacin (CIPRO) 250 MG tablet Take 1 tablet (250 mg total) by mouth daily. 30 tablet 2  . ferrous sulfate 325 (65 FE) MG tablet Take 325 mg by mouth daily with breakfast.    . losartan (COZAAR) 50 MG tablet 50 mg daily.    Marland Kitchen omeprazole (PRILOSEC) 20 MG capsule Take 1 capsule by mouth daily.    Marland Kitchen escitalopram (LEXAPRO) 5 MG tablet Take 5 mg by mouth daily.     No current facility-administered medications for this visit.      PHYSICAL EXAMINATION: ECOG PERFORMANCE STATUS: 1 - Symptomatic but completely ambulatory Vitals:   05/14/18 1347  BP: 137/77  Pulse: 88  Temp: (!) 97.3 F (36.3 C)   Filed Weights   05/14/18 1347  Weight: 144 lb (65.3 kg)    Physical Exam  Constitutional: She is oriented to person, place, and time and well-developed, well-nourished, and in no distress. No distress.  HENT:  Head: Normocephalic and atraumatic.  Nose: Nose normal.  Mouth/Throat: Oropharynx is clear and moist. No oropharyngeal exudate.  Eyes: Pupils are equal, round, and reactive to light. EOM are normal. Right eye exhibits no discharge. Left eye exhibits no discharge. No scleral icterus.  Neck: Normal range of motion. Neck supple. No JVD present.  Cardiovascular: Normal rate and regular rhythm.  Murmur heard. Systolic murmur  Pulmonary/Chest: Effort normal and breath sounds normal. No respiratory distress. She has no wheezes. She has no rales. She exhibits no tenderness.  Abdominal: Soft. Bowel sounds are normal. She exhibits no distension and no mass. There is no tenderness. There is no rebound.  Musculoskeletal: Normal range of motion. She exhibits no edema or tenderness.  Lymphadenopathy:    She has no cervical adenopathy.  Neurological: She is alert and oriented to person,  place, and time. No cranial nerve deficit. She exhibits normal muscle tone. Coordination normal.  Skin: Skin is warm and dry. No rash noted. She is not diaphoretic. No erythema.  Psychiatric: Affect and judgment normal.     LABORATORY DATA:  I have reviewed the data as listed Lab Results  Component Value Date   WBC 2.7 (L) 05/14/2018   HGB 9.1 (L) 05/14/2018   HCT 27.6 (L) 05/14/2018   MCV 101.1 (H) 05/14/2018   PLT 192 05/14/2018   Recent Labs    11/16/17 1202  NA 141  K 3.9  CL 104  CO2 27  GLUCOSE 102*  BUN 17  CREATININE 1.06*  CALCIUM 9.2  GFRNONAA 48*  GFRAA 56*  PROT 8.2*  ALBUMIN 4.2  AST 21  ALT 14  ALKPHOS 55  BILITOT 1.0    Normal vitamin B12, Folate, iron/TIBC, ferritin, TSH normal. No monoclonal protein. Normal LDH, TSH.   ASSESSMENT &  PLAN:  1. Neutropenia, unspecified type ([place])   2. Anemia, unspecified type    # Bicytopenia,?  MDS Patient declined bone marrow biopsy for further evaluation.  Today's lab tests were reviewed and discussed with patient and her son.   ANC is stable. Continue cipro 250 mg daily for prophylaxis.  Neutropenic precaution discussed with patient.  Macrocytic Anemia: stable. 9.1  ?MDS vs anemia of CKD. Continue monitor.   All questions were answered. The patient knows to call the clinic with any problems questions or concerns.  Return of visit: 3 months with repeat cbc.   Earlie Server, MD, PhD Hematology Oncology [facility] at [facility] Pager- [phone] 05/15/2018

## 2018-08-12 ENCOUNTER — Encounter: Payer: Self-pay | Admitting: Oncology

## 2018-08-12 ENCOUNTER — Other Ambulatory Visit: Payer: Self-pay

## 2018-08-12 ENCOUNTER — Inpatient Hospital Stay: Payer: Medicare HMO | Attending: Oncology

## 2018-08-12 ENCOUNTER — Inpatient Hospital Stay (HOSPITAL_BASED_OUTPATIENT_CLINIC_OR_DEPARTMENT_OTHER): Payer: Medicare HMO | Admitting: Oncology

## 2018-08-12 VITALS — BP 128/80 | HR 103 | Temp 96.9°F | Resp 18 | Wt 143.9 lb

## 2018-08-12 DIAGNOSIS — D649 Anemia, unspecified: Secondary | ICD-10-CM | POA: Insufficient documentation

## 2018-08-12 DIAGNOSIS — Z7982 Long term (current) use of aspirin: Secondary | ICD-10-CM | POA: Insufficient documentation

## 2018-08-12 DIAGNOSIS — Z79899 Other long term (current) drug therapy: Secondary | ICD-10-CM

## 2018-08-12 DIAGNOSIS — F329 Major depressive disorder, single episode, unspecified: Secondary | ICD-10-CM | POA: Diagnosis not present

## 2018-08-12 DIAGNOSIS — D709 Neutropenia, unspecified: Secondary | ICD-10-CM | POA: Insufficient documentation

## 2018-08-12 DIAGNOSIS — I10 Essential (primary) hypertension: Secondary | ICD-10-CM | POA: Diagnosis not present

## 2018-08-12 LAB — CBC WITH DIFFERENTIAL/PLATELET
ABS IMMATURE GRANULOCYTES: 0 10*3/uL (ref 0.00–0.07)
BASOS ABS: 0.1 10*3/uL (ref 0.0–0.1)
BASOS PCT: 3 %
Eosinophils Absolute: 0 10*3/uL (ref 0.0–0.5)
Eosinophils Relative: 2 %
HCT: 27 % — ABNORMAL LOW (ref 36.0–46.0)
HEMOGLOBIN: 8.8 g/dL — AB (ref 12.0–15.0)
IMMATURE GRANULOCYTES: 0 %
Lymphocytes Relative: 34 %
Lymphs Abs: 0.8 10*3/uL (ref 0.7–4.0)
MCH: 32.5 pg (ref 26.0–34.0)
MCHC: 32.6 g/dL (ref 30.0–36.0)
MCV: 99.6 fL (ref 80.0–100.0)
Monocytes Absolute: 0.2 10*3/uL (ref 0.1–1.0)
Monocytes Relative: 10 %
NEUTROS ABS: 1.1 10*3/uL — AB (ref 1.7–7.7)
NEUTROS PCT: 51 %
NRBC: 0 % (ref 0.0–0.2)
PLATELETS: 218 10*3/uL (ref 150–400)
RBC: 2.71 MIL/uL — AB (ref 3.87–5.11)
RDW: 14.5 % (ref 11.5–15.5)
WBC: 2.2 10*3/uL — AB (ref 4.0–10.5)

## 2018-08-12 NOTE — Progress Notes (Signed)
Patient here for follow up. Pt states that she gets tired quick and sometimes she has sharp pain to left let. Pt requesting a vitamin so she can have energy.

## 2018-08-12 NOTE — Progress Notes (Signed)
Hematology/Oncology Follow up note [facility] Telephone:(336) [phone] Fax:(336) [phone]   Patient Care Team: Earlie Server, MD as PCP - General (Oncology)  REFERRING PROVIDER: Earlie Server, MD and Whitaker, [place] VISIT Follow up for management of leukopenia and anemia.   HISTORY OF PRESENTING ILLNESS:  Martha Butler is a  83 y.o.  female with PMH listed below who was referred to me for evaluation of low white count and anemia. Patient reports that she is been feeling tired recently quite stressed. Her husband passed away approximately 1 month ago. She lives by herself. Doesn't have good appetite.   INTERVAL HISTORY Martha Butler is a 83 y.o. female who has above history reviewed by me today presents for follow up for management of chronic pancytopenia. Patient was accompanied by her granddaughter.  Denies any new complaints.  Denies any fever, chills, bleeding, chest pain, shortness of breath, abdominal pain.  Feels tired.  Otherwise no new complaints.  Denies any frequent infection. Appetite is fair.  Review of Systems  Constitutional: Positive for malaise/fatigue. Negative for chills, fever and weight loss.  HENT: Negative for congestion, ear discharge, ear pain, hearing loss, nosebleeds, sinus pain and sore throat.   Eyes: Negative for blurred vision, double vision, photophobia, pain, discharge and redness.  Respiratory: Negative for cough, hemoptysis, sputum production, shortness of breath and wheezing.   Cardiovascular: Negative for chest pain, palpitations and orthopnea.  Gastrointestinal: Negative for abdominal pain, blood in stool, diarrhea, heartburn, nausea and vomiting.  Genitourinary: Negative for dysuria and frequency.  Musculoskeletal: Negative for back pain, myalgias and neck pain.  Skin: Negative for itching and rash.  Neurological: Negative for dizziness, tingling, tremors, focal weakness, weakness and headaches.  Endo/Heme/Allergies:  Negative for environmental allergies. Does not bruise/bleed easily.  Psychiatric/Behavioral: Negative for depression, hallucinations, substance abuse and suicidal ideas. The patient is not nervous/anxious and does not have insomnia.        Feels not having much interest of doing anything.    MEDICAL HISTORY:  Past Medical History:  Diagnosis Date  . Anemia   . Depression   . GERD (gastroesophageal reflux disease)   . Hypertension     SURGICAL HISTORY: History reviewed. No pertinent surgical history.  SOCIAL HISTORY: Social History   Socioeconomic History  . Marital status: Married    Spouse name: Not on file  . Number of children: Not on file  . Years of education: Not on file  . Highest education level: Not on file  Occupational History  . Not on file  Social Needs  . Financial resource strain: Not on file  . Food insecurity:    Worry: Not on file    Inability: Not on file  . Transportation needs:    Medical: Not on file    Non-medical: Not on file  Tobacco Use  . Smoking status: Never Smoker  . Smokeless tobacco: Never Used  Substance and Sexual Activity  . Alcohol use: No    Frequency: Never  . Drug use: No  . Sexual activity: Not on file  Lifestyle  . Physical activity:    Days per week: Not on file    Minutes per session: Not on file  . Stress: Not on file  Relationships  . Social connections:    Talks on phone: Not on file    Gets together: Not on file    Attends religious service: Not on file    Active member of club or organization: Not on  file    Attends meetings of clubs or organizations: Not on file    Relationship status: Not on file  . Intimate partner violence:    Fear of current or ex partner: Not on file    Emotionally abused: Not on file    Physically abused: Not on file    Forced sexual activity: Not on file  Other Topics Concern  . Not on file  Social History Narrative  . Not on file    FAMILY HISTORY: History reviewed. No  pertinent family history.  ALLERGIES:  has No Known Allergies.  MEDICATIONS:  Current Outpatient Medications  Medication Sig Dispense Refill  . aspirin EC 81 MG tablet Take 1 tablet by mouth daily.    . Blood Pressure Monitoring (BLOOD PRESSURE KIT) KIT     . ciprofloxacin (CIPRO) 250 MG tablet Take 1 tablet (250 mg total) by mouth daily. 30 tablet 2  . escitalopram (LEXAPRO) 5 MG tablet Take 5 mg by mouth daily.    Marland Kitchen losartan (COZAAR) 50 MG tablet 50 mg daily.    Marland Kitchen escitalopram (LEXAPRO) 5 MG tablet Take 5 mg by mouth daily.    . ferrous sulfate 325 (65 FE) MG tablet Take 325 mg by mouth daily with breakfast.    . omeprazole (PRILOSEC) 20 MG capsule Take 1 capsule by mouth daily.     No current facility-administered medications for this visit.      PHYSICAL EXAMINATION: ECOG PERFORMANCE STATUS: 1 - Symptomatic but completely ambulatory Vitals:   08/12/18 1024  BP: 128/80  Pulse: (!) 103  Resp: 18  Temp: (!) 96.9 F (36.1 C)   Filed Weights   08/12/18 1024  Weight: 143 lb 14.4 oz (65.3 kg)    Physical Exam  Constitutional: She is oriented to person, place, and time and well-developed, well-nourished, and in no distress. No distress.  HENT:  Head: Normocephalic and atraumatic.  Nose: Nose normal.  Mouth/Throat: Oropharynx is clear and moist. No oropharyngeal exudate.  Eyes: Pupils are equal, round, and reactive to light. EOM are normal. Right eye exhibits no discharge. Left eye exhibits no discharge. No scleral icterus.  Neck: Normal range of motion. Neck supple. No JVD present.  Cardiovascular: Normal rate and regular rhythm.  Murmur heard. Systolic murmur  Pulmonary/Chest: Effort normal and breath sounds normal. No respiratory distress. She has no wheezes. She has no rales. She exhibits no tenderness.  Abdominal: Soft. Bowel sounds are normal. She exhibits no distension and no mass. There is no tenderness. There is no rebound.  Musculoskeletal: Normal range of motion.  She exhibits no edema or tenderness.  Lymphadenopathy:    She has no cervical adenopathy.  Neurological: She is alert and oriented to person, place, and time. No cranial nerve deficit. She exhibits normal muscle tone. Coordination normal.  Skin: Skin is warm and dry. No rash noted. She is not diaphoretic. No erythema.  Psychiatric: Affect and judgment normal.     LABORATORY DATA:  I have reviewed the data as listed Lab Results  Component Value Date   WBC 2.2 (L) 08/12/2018   HGB 8.8 (L) 08/12/2018   HCT 27.0 (L) 08/12/2018   MCV 99.6 08/12/2018   PLT 218 08/12/2018   Recent Labs    11/16/17 1202  NA 141  K 3.9  CL 104  CO2 27  GLUCOSE 102*  BUN 17  CREATININE 1.06*  CALCIUM 9.2  GFRNONAA 48*  GFRAA 56*  PROT 8.2*  ALBUMIN 4.2  AST 21  ALT 14  ALKPHOS 55  BILITOT 1.0    Normal vitamin B12, Folate, iron/TIBC, ferritin, TSH normal. No monoclonal protein. Normal LDH, TSH.   ASSESSMENT & PLAN:  1. Neutropenia, unspecified type ([place])   2. Anemia, unspecified type    # Bicytopenia, Labs reviewed and discussed with patient and her granddaughter.  Patient continues to have persistent anemia and neutropenia. Hemoglobin has further decreased to 8.8. neutropenia 1.1.Improved ?  MDS This Concern has been previously discussed with patient  And she is not interested in bone marrow biopsy.  I think this is reasonable given that relatively stable and gradual decline of her counts.  Since her neutropenia has improved.  I suggest patient to stop prophylactic Cipro and continue to monitor. Recommend multivitamin and iron supplements. All questions were answered. The patient knows to call the clinic with any problems questions or concerns.  Return of visit: 3 months with repeat cbc and CMP  Earlie Server, MD, PhD Hematology Oncology [facility] at [facility] Pager- [phone] 08/12/2018

## 2018-11-18 ENCOUNTER — Inpatient Hospital Stay: Payer: Medicare HMO | Attending: Oncology | Admitting: Oncology

## 2018-11-18 ENCOUNTER — Other Ambulatory Visit: Payer: Self-pay

## 2018-11-18 ENCOUNTER — Encounter: Payer: Self-pay | Admitting: Oncology

## 2018-11-18 ENCOUNTER — Inpatient Hospital Stay: Payer: Medicare HMO

## 2018-11-18 VITALS — BP 122/74 | HR 97 | Temp 97.9°F | Resp 18 | Wt 150.3 lb

## 2018-11-18 DIAGNOSIS — F329 Major depressive disorder, single episode, unspecified: Secondary | ICD-10-CM

## 2018-11-18 DIAGNOSIS — D72819 Decreased white blood cell count, unspecified: Secondary | ICD-10-CM | POA: Insufficient documentation

## 2018-11-18 DIAGNOSIS — Z79899 Other long term (current) drug therapy: Secondary | ICD-10-CM | POA: Diagnosis not present

## 2018-11-18 DIAGNOSIS — D638 Anemia in other chronic diseases classified elsewhere: Secondary | ICD-10-CM | POA: Insufficient documentation

## 2018-11-18 DIAGNOSIS — R5383 Other fatigue: Secondary | ICD-10-CM

## 2018-11-18 DIAGNOSIS — N189 Chronic kidney disease, unspecified: Secondary | ICD-10-CM | POA: Insufficient documentation

## 2018-11-18 DIAGNOSIS — I129 Hypertensive chronic kidney disease with stage 1 through stage 4 chronic kidney disease, or unspecified chronic kidney disease: Secondary | ICD-10-CM | POA: Diagnosis not present

## 2018-11-18 DIAGNOSIS — D649 Anemia, unspecified: Secondary | ICD-10-CM

## 2018-11-18 DIAGNOSIS — Z7982 Long term (current) use of aspirin: Secondary | ICD-10-CM

## 2018-11-18 DIAGNOSIS — D539 Nutritional anemia, unspecified: Secondary | ICD-10-CM

## 2018-11-18 DIAGNOSIS — D709 Neutropenia, unspecified: Secondary | ICD-10-CM

## 2018-11-18 LAB — COMPREHENSIVE METABOLIC PANEL
ALK PHOS: 55 U/L (ref 38–126)
ALT: 9 U/L (ref 0–44)
ANION GAP: 8 (ref 5–15)
AST: 17 U/L (ref 15–41)
Albumin: 4 g/dL (ref 3.5–5.0)
BUN: 24 mg/dL — ABNORMAL HIGH (ref 8–23)
CALCIUM: 9.2 mg/dL (ref 8.9–10.3)
CO2: 30 mmol/L (ref 22–32)
Chloride: 104 mmol/L (ref 98–111)
Creatinine, Ser: 1.11 mg/dL — ABNORMAL HIGH (ref 0.44–1.00)
GFR, EST AFRICAN AMERICAN: 54 mL/min — AB (ref >60)
GFR, EST NON AFRICAN AMERICAN: 47 mL/min — AB (ref >60)
Glucose, Bld: 154 mg/dL — ABNORMAL HIGH (ref 70–99)
Potassium: 4.4 mmol/L (ref 3.5–5.1)
Sodium: 142 mmol/L (ref 135–145)
Total Bilirubin: 0.3 mg/dL (ref 0.3–1.2)
Total Protein: 8.3 g/dL — ABNORMAL HIGH (ref 6.5–8.1)

## 2018-11-18 LAB — IRON AND TIBC
Iron: 93 ug/dL (ref 28–170)
Saturation Ratios: 30 % (ref 10.4–31.8)
TIBC: 308 ug/dL (ref 250–450)
UIBC: 215 ug/dL

## 2018-11-18 LAB — CBC WITH DIFFERENTIAL/PLATELET
Abs Immature Granulocytes: 0 10*3/uL (ref 0.00–0.07)
Basophils Absolute: 0 10*3/uL (ref 0.0–0.1)
Basophils Relative: 2 %
EOS ABS: 0.1 10*3/uL (ref 0.0–0.5)
Eosinophils Relative: 2 %
HCT: 28.2 % — ABNORMAL LOW (ref 36.0–46.0)
Hemoglobin: 8.9 g/dL — ABNORMAL LOW (ref 12.0–15.0)
IMMATURE GRANULOCYTES: 0 %
Lymphocytes Relative: 38 %
Lymphs Abs: 0.9 10*3/uL (ref 0.7–4.0)
MCH: 32 pg (ref 26.0–34.0)
MCHC: 31.6 g/dL (ref 30.0–36.0)
MCV: 101.4 fL — AB (ref 80.0–100.0)
MONOS PCT: 9 %
Monocytes Absolute: 0.2 10*3/uL (ref 0.1–1.0)
NEUTROS PCT: 49 %
NRBC: 0 % (ref 0.0–0.2)
Neutro Abs: 1.2 10*3/uL — ABNORMAL LOW (ref 1.7–7.7)
Platelets: 214 10*3/uL (ref 150–400)
RBC: 2.78 MIL/uL — ABNORMAL LOW (ref 3.87–5.11)
RDW: 14.6 % (ref 11.5–15.5)
WBC: 2.4 10*3/uL — AB (ref 4.0–10.5)

## 2018-11-18 LAB — FERRITIN: Ferritin: 65 ng/mL (ref 11–307)

## 2018-11-18 MED ORDER — FERROUS SULFATE 325 (65 FE) MG PO TABS: 325.0000 mg | ORAL_TABLET | Freq: Every day | ORAL | 3 refills | Status: DC

## 2018-11-18 NOTE — Progress Notes (Signed)
Patient here for follow up. Pt states that sometimes she has a sharp pain to left upper leg.

## 2018-11-18 NOTE — Progress Notes (Signed)
Hematology/Oncology Follow up note [facility] Telephone:(336) [phone] Fax:(336) [phone]   Patient Care Team: Earlie Server, MD as PCP - General (Oncology)  REFERRING PROVIDER: Earlie Server, MD and Whitaker, [place] VISIT Follow up for management of leukopenia and anemia.   HISTORY OF PRESENTING ILLNESS:  Martha Butler is a  84 y.o.  female with PMH listed below who was referred to me for evaluation of low white count and anemia. Patient reports that she is been feeling tired recently quite stressed. Her husband passed away approximately 1 month ago. She lives by herself. Doesn't have good appetite.   INTERVAL HISTORY CARMEN TOLLIVER is a 84 y.o. female who has above history reviewed by me today presents for follow up for management of chronic pancytopenia.  Patient was accompanied by her granddaughter.  Denies any new complaints.  Denies any fever, chills, infection, hospitalization during interval.  No chest pain, shortness of breath, abdominal pain. She feels tired.  Still able to do some house chores. Hepatitis fair.  She has gained 7 pounds since last visit.  Currently weight 150 pounds. Lives by herself.  Review of Systems  Constitutional: Positive for malaise/fatigue. Negative for chills, fever and weight loss.  HENT: Negative for sore throat.   Eyes: Negative for redness.  Respiratory: Negative for cough, shortness of breath and wheezing.   Cardiovascular: Negative for chest pain, palpitations and leg swelling.  Gastrointestinal: Negative for abdominal pain, blood in stool, nausea and vomiting.  Genitourinary: Negative for dysuria.  Musculoskeletal: Negative for myalgias.  Skin: Negative for rash.  Neurological: Negative for dizziness, tingling and tremors.  Endo/Heme/Allergies: Does not bruise/bleed easily.  Psychiatric/Behavioral: Negative for hallucinations.    MEDICAL HISTORY:  Past Medical History:  Diagnosis Date  . Anemia   .  Depression   . GERD (gastroesophageal reflux disease)   . Hypertension     SURGICAL HISTORY: History reviewed. No pertinent surgical history.  SOCIAL HISTORY: Social History   Socioeconomic History  . Marital status: Married    Spouse name: Not on file  . Number of children: Not on file  . Years of education: Not on file  . Highest education level: Not on file  Occupational History  . Not on file  Social Needs  . Financial resource strain: Not on file  . Food insecurity:    Worry: Not on file    Inability: Not on file  . Transportation needs:    Medical: Not on file    Non-medical: Not on file  Tobacco Use  . Smoking status: Never Smoker  . Smokeless tobacco: Never Used  Substance and Sexual Activity  . Alcohol use: No    Frequency: Never  . Drug use: No  . Sexual activity: Not on file  Lifestyle  . Physical activity:    Days per week: Not on file    Minutes per session: Not on file  . Stress: Not on file  Relationships  . Social connections:    Talks on phone: Not on file    Gets together: Not on file    Attends religious service: Not on file    Active member of club or organization: Not on file    Attends meetings of clubs or organizations: Not on file    Relationship status: Not on file  . Intimate partner violence:    Fear of current or ex partner: Not on file    Emotionally abused: Not on file    Physically abused:  Not on file    Forced sexual activity: Not on file  Other Topics Concern  . Not on file  Social History Narrative  . Not on file    FAMILY HISTORY: History reviewed. No pertinent family history.  ALLERGIES:  has No Known Allergies.  MEDICATIONS:  Current Outpatient Medications  Medication Sig Dispense Refill  . aspirin EC 81 MG tablet Take 1 tablet by mouth daily.    . Blood Pressure Monitoring (BLOOD PRESSURE KIT) KIT     . escitalopram (LEXAPRO) 5 MG tablet Take 5 mg by mouth daily.    . ferrous sulfate 325 (65 FE) MG tablet Take  325 mg by mouth daily with breakfast.    . losartan (COZAAR) 50 MG tablet 50 mg daily.    Marland Kitchen omeprazole (PRILOSEC) 20 MG capsule Take 1 capsule by mouth daily.    Marland Kitchen escitalopram (LEXAPRO) 5 MG tablet Take 5 mg by mouth daily.     No current facility-administered medications for this visit.      PHYSICAL EXAMINATION: ECOG PERFORMANCE STATUS: 1 - Symptomatic but completely ambulatory Vitals:   11/18/18 1007  BP: 122/74  Pulse: 97  Resp: 18  Temp: 97.9 F (36.6 C)  SpO2: 100%   Filed Weights   11/18/18 1007  Weight: 150 lb 4.8 oz (68.2 kg)    Physical Exam  Constitutional: She is oriented to person, place, and time and well-developed, well-nourished, and in no distress. No distress.  HENT:  Head: Normocephalic and atraumatic.  Nose: Nose normal.  Mouth/Throat: Oropharynx is clear and moist. No oropharyngeal exudate.  Eyes: Pupils are equal, round, and reactive to light. EOM are normal. Right eye exhibits no discharge. Left eye exhibits no discharge. No scleral icterus.  Neck: Normal range of motion. Neck supple. No JVD present.  Cardiovascular: Normal rate and regular rhythm.  Murmur heard. Systolic murmur  Pulmonary/Chest: Effort normal and breath sounds normal. No respiratory distress. She has no wheezes. She has no rales. She exhibits no tenderness.  Abdominal: Soft. Bowel sounds are normal. She exhibits no distension and no mass. There is no abdominal tenderness. There is no rebound.  Musculoskeletal: Normal range of motion.        General: No tenderness or edema.  Lymphadenopathy:    She has no cervical adenopathy.  Neurological: She is alert and oriented to person, place, and time. No cranial nerve deficit. She exhibits normal muscle tone. Coordination normal.  Skin: Skin is warm and dry. No rash noted. She is not diaphoretic. No erythema.  Psychiatric: Affect and judgment normal.     LABORATORY DATA:  I have reviewed the data as listed Lab Results  Component  Value Date   WBC 2.4 (L) 11/18/2018   HGB 8.9 (L) 11/18/2018   HCT 28.2 (L) 11/18/2018   MCV 101.4 (H) 11/18/2018   PLT 214 11/18/2018   Recent Labs    11/18/18 0952  NA 142  K 4.4  CL 104  CO2 30  GLUCOSE 154*  BUN 24*  CREATININE 1.11*  CALCIUM 9.2  GFRNONAA 47*  GFRAA 54*  PROT 8.3*  ALBUMIN 4.0  AST 17  ALT 9  ALKPHOS 55  BILITOT 0.3    Normal vitamin B12, Folate, iron/TIBC, ferritin, TSH normal. No monoclonal protein. Normal LDH, TSH.   ASSESSMENT & PLAN:  1. Anemia of chronic disease   2. Other fatigue   3. Macrocytic anemia    #Anemia of chronic disease Labs are reviewed and discussed with patient and  her granddaughter.  Hemoglobin 8.9, stable from 3 months ago. CMP showed creatinine 1.11, GFR 54.  1 year ago, creatinine was 1.06.  This is consistent with chronic kidney insufficiency. Anemia can be secondary to chronic kidney disease. Also have previously discussed with patient that due to anemia and neutropenia, MDS is a possibility. Patient is not interested in aggressive work-up.  Hold bone marrow biopsy. I will repeat her iron level. Also discussed with patient that increasing patient's iron level may help increasing patient's hemoglobin counts. If hemoglobin does not improve despite having adequate iron stores, Procrit can be considered. I discussed about rationale of IV iron to further increase patient's iron stores.  Patient declined.  Will start patient with oral ferrous sulfate 325 mg twice daily. Repeat iron panel at the next visit in 3 months.   All questions were answered. The patient knows to call the clinic with any problems questions or concerns.  Return of visit: 3 months with repeat cbc, iron ferritin.   Earlie Server, MD, PhD Hematology Oncology [facility] at [facility] Pager- [phone] 11/18/2018

## 2019-02-17 ENCOUNTER — Inpatient Hospital Stay: Payer: Medicare HMO | Attending: Oncology

## 2019-02-17 ENCOUNTER — Other Ambulatory Visit: Payer: Self-pay

## 2019-02-17 DIAGNOSIS — D709 Neutropenia, unspecified: Secondary | ICD-10-CM | POA: Insufficient documentation

## 2019-02-17 DIAGNOSIS — D539 Nutritional anemia, unspecified: Secondary | ICD-10-CM | POA: Insufficient documentation

## 2019-02-17 DIAGNOSIS — Z7982 Long term (current) use of aspirin: Secondary | ICD-10-CM | POA: Diagnosis not present

## 2019-02-17 DIAGNOSIS — I129 Hypertensive chronic kidney disease with stage 1 through stage 4 chronic kidney disease, or unspecified chronic kidney disease: Secondary | ICD-10-CM | POA: Insufficient documentation

## 2019-02-17 DIAGNOSIS — F329 Major depressive disorder, single episode, unspecified: Secondary | ICD-10-CM | POA: Insufficient documentation

## 2019-02-17 DIAGNOSIS — Z79899 Other long term (current) drug therapy: Secondary | ICD-10-CM | POA: Diagnosis not present

## 2019-02-17 DIAGNOSIS — N189 Chronic kidney disease, unspecified: Secondary | ICD-10-CM | POA: Insufficient documentation

## 2019-02-17 DIAGNOSIS — D631 Anemia in chronic kidney disease: Secondary | ICD-10-CM | POA: Insufficient documentation

## 2019-02-17 DIAGNOSIS — K219 Gastro-esophageal reflux disease without esophagitis: Secondary | ICD-10-CM | POA: Diagnosis not present

## 2019-02-17 DIAGNOSIS — R5383 Other fatigue: Secondary | ICD-10-CM

## 2019-02-17 LAB — CBC WITH DIFFERENTIAL/PLATELET
Abs Immature Granulocytes: 0.01 10*3/uL (ref 0.00–0.07)
Basophils Absolute: 0 10*3/uL (ref 0.0–0.1)
Basophils Relative: 1 %
Eosinophils Absolute: 0.1 10*3/uL (ref 0.0–0.5)
Eosinophils Relative: 3 %
HCT: 27.8 % — ABNORMAL LOW (ref 36.0–46.0)
Hemoglobin: 9.1 g/dL — ABNORMAL LOW (ref 12.0–15.0)
Immature Granulocytes: 0 %
Lymphocytes Relative: 42 %
Lymphs Abs: 1 10*3/uL (ref 0.7–4.0)
MCH: 32.7 pg (ref 26.0–34.0)
MCHC: 32.7 g/dL (ref 30.0–36.0)
MCV: 100 fL (ref 80.0–100.0)
Monocytes Absolute: 0.2 10*3/uL (ref 0.1–1.0)
Monocytes Relative: 8 %
Neutro Abs: 1.1 10*3/uL — ABNORMAL LOW (ref 1.7–7.7)
Neutrophils Relative %: 46 %
Platelets: 200 10*3/uL (ref 150–400)
RBC: 2.78 MIL/uL — ABNORMAL LOW (ref 3.87–5.11)
RDW: 14.5 % (ref 11.5–15.5)
WBC: 2.3 10*3/uL — ABNORMAL LOW (ref 4.0–10.5)
nRBC: 0 % (ref 0.0–0.2)

## 2019-02-17 LAB — IRON AND TIBC
Iron: 81 ug/dL (ref 28–170)
Saturation Ratios: 24 % (ref 10.4–31.8)
TIBC: 336 ug/dL (ref 250–450)
UIBC: 255 ug/dL

## 2019-02-17 LAB — FERRITIN: Ferritin: 64 ng/mL (ref 11–307)

## 2019-02-19 ENCOUNTER — Inpatient Hospital Stay (HOSPITAL_BASED_OUTPATIENT_CLINIC_OR_DEPARTMENT_OTHER): Payer: Medicare HMO | Admitting: Oncology

## 2019-02-19 ENCOUNTER — Other Ambulatory Visit: Payer: Self-pay

## 2019-02-19 ENCOUNTER — Encounter: Payer: Self-pay | Admitting: Oncology

## 2019-02-19 DIAGNOSIS — D539 Nutritional anemia, unspecified: Secondary | ICD-10-CM

## 2019-02-19 DIAGNOSIS — N189 Chronic kidney disease, unspecified: Secondary | ICD-10-CM | POA: Diagnosis not present

## 2019-02-19 DIAGNOSIS — D638 Anemia in other chronic diseases classified elsewhere: Secondary | ICD-10-CM

## 2019-02-19 DIAGNOSIS — D709 Neutropenia, unspecified: Secondary | ICD-10-CM

## 2019-02-19 MED ORDER — FERROUS SULFATE 325 (65 FE) MG PO TABS: 325.0000 mg | ORAL_TABLET | Freq: Every day | ORAL | 3 refills | Status: DC

## 2019-02-19 MED ORDER — DOCUSATE SODIUM 100 MG PO CAPS: 100.0000 mg | ORAL_CAPSULE | Freq: Every day | ORAL | 1 refills | Status: DC

## 2019-02-19 NOTE — Progress Notes (Signed)
Called patient for Doximity visit.  Patient c/o constipation at times and low appetite.

## 2019-02-20 NOTE — Progress Notes (Signed)
HEMATOLOGY-ONCOLOGY TeleHEALTH VISIT PROGRESS NOTE  I connected with Martha Butler on 02/20/19 at 10:15 AM EDT by video enabled telemedicine visit and verified that I am speaking with the correct person using two identifiers. I discussed the limitations, risks, security and privacy concerns of performing an evaluation and management service by telemedicine and the availability of in-person appointments. I also discussed with the patient that there may be a patient responsible charge related to this service. The patient expressed understanding and agreed to proceed.   Other persons participating in the visit and their role in the encounter:  Geraldine Solar, CMA, check in patient   Granddaughter Dewitt Hoes to set up video visit for patient.   Patient's location: Home  Provider's location: work Risk analyst Complaint: Follow-up for management of anemia, leukopenia.   INTERVAL HISTORY Martha Butler is a 84 y.o. female who has above history reviewed by me today presents for follow up visit for management of anemia and leukopenia Problems and complaints are listed below:  Patient reports feeling well at baseline.  She has chronic fatigue which is at baseline.  Denies any shortness of breath, chest pain, nausea vomiting, unintentional weight loss, abdominal pain, leg swelling. Today she has no new complaints.  Review of Systems  Constitutional: Positive for fatigue. Negative for appetite change, chills and fever.  HENT:   Negative for hearing loss and voice change.   Eyes: Negative for eye problems.  Respiratory: Negative for chest tightness and cough.   Cardiovascular: Negative for chest pain.  Gastrointestinal: Negative for abdominal distention, abdominal pain and blood in stool.  Endocrine: Negative for hot flashes.  Genitourinary: Negative for difficulty urinating and frequency.   Musculoskeletal: Negative for arthralgias.  Skin: Negative for itching and rash.  Neurological: Negative for  extremity weakness.  Hematological: Negative for adenopathy.  Psychiatric/Behavioral: Negative for confusion.    Past Medical History:  Diagnosis Date  . Anemia   . Depression   . GERD (gastroesophageal reflux disease)   . Hypertension    History reviewed. No pertinent surgical history.  History reviewed. No pertinent family history.  Social History   Socioeconomic History  . Marital status: Married    Spouse name: Not on file  . Number of children: Not on file  . Years of education: Not on file  . Highest education level: Not on file  Occupational History  . Not on file  Social Needs  . Financial resource strain: Not on file  . Food insecurity:    Worry: Not on file    Inability: Not on file  . Transportation needs:    Medical: Not on file    Non-medical: Not on file  Tobacco Use  . Smoking status: Never Smoker  . Smokeless tobacco: Never Used  Substance and Sexual Activity  . Alcohol use: No    Frequency: Never  . Drug use: No  . Sexual activity: Not on file  Lifestyle  . Physical activity:    Days per week: Not on file    Minutes per session: Not on file  . Stress: Not on file  Relationships  . Social connections:    Talks on phone: Not on file    Gets together: Not on file    Attends religious service: Not on file    Active member of club or organization: Not on file    Attends meetings of clubs or organizations: Not on file    Relationship status: Not on file  . Intimate partner violence:  Fear of current or ex partner: Not on file    Emotionally abused: Not on file    Physically abused: Not on file    Forced sexual activity: Not on file  Other Topics Concern  . Not on file  Social History Narrative  . Not on file    Current Outpatient Medications on File Prior to Visit  Medication Sig Dispense Refill  . aspirin EC 81 MG tablet Take 1 tablet by mouth daily.    . Blood Pressure Monitoring (BLOOD PRESSURE KIT) KIT     . escitalopram (LEXAPRO) 5  MG tablet Take 5 mg by mouth daily.    Marland Kitchen losartan (COZAAR) 50 MG tablet 50 mg daily.    Marland Kitchen omeprazole (PRILOSEC) 20 MG capsule Take 1 capsule by mouth daily.     No current facility-administered medications on file prior to visit.     No Known Allergies     Observations/Objective: There were no vitals filed for this visit. There is no height or weight on file to calculate BMI.  Pain level 0 Physical Exam  Constitutional: She is oriented to person, place, and time. No distress.  HENT:  Head: Normocephalic and atraumatic.  Mouth/Throat: No oropharyngeal exudate.  Neck: Normal range of motion.  Pulmonary/Chest: Effort normal. No respiratory distress.  Neurological: She is alert and oriented to person, place, and time.  Psychiatric: Affect normal.    CBC    Component Value Date/Time   WBC 2.3 (L) 02/17/2019 1107   RBC 2.78 (L) 02/17/2019 1107   HGB 9.1 (L) 02/17/2019 1107   HCT 27.8 (L) 02/17/2019 1107   PLT 200 02/17/2019 1107   MCV 100.0 02/17/2019 1107   MCH 32.7 02/17/2019 1107   MCHC 32.7 02/17/2019 1107   RDW 14.5 02/17/2019 1107   LYMPHSABS 1.0 02/17/2019 1107   MONOABS 0.2 02/17/2019 1107   EOSABS 0.1 02/17/2019 1107   BASOSABS 0.0 02/17/2019 1107    CMP     Component Value Date/Time   NA 142 11/18/2018 0952   K 4.4 11/18/2018 0952   CL 104 11/18/2018 0952   CO2 30 11/18/2018 0952   GLUCOSE 154 (H) 11/18/2018 0952   BUN 24 (H) 11/18/2018 0952   CREATININE 1.11 (H) 11/18/2018 0952   CALCIUM 9.2 11/18/2018 0952   PROT 8.3 (H) 11/18/2018 0952   ALBUMIN 4.0 11/18/2018 0952   AST 17 11/18/2018 0952   ALT 9 11/18/2018 0952   ALKPHOS 55 11/18/2018 0952   BILITOT 0.3 11/18/2018 0952   GFRNONAA 47 (L) 11/18/2018 0952   GFRAA 54 (L) 11/18/2018 0952     Assessment and Plan: 1. Anemia of chronic disease   2. Macrocytic anemia   3. Neutropenia, unspecified type [facility])     Labs are reviewed and discussed with patient. Her iron level has been stable. Chronic  leukopenia total white count 2.3, slightly worse than 3 months ago.. ANC slightly lower than 3 months ago.  Continue to monitor. Macrocytic anemia hemoglobin 9.1, slightly better than 3 months ago. Suspect that patient has underlying MDS.  This has been discussed previously and also today. Patient is not interested in aggressive work-up.  Hold bone marrow biopsy. Anemia and chronic kidney disease may also contribute.  Recommend patient to continue take oral iron supplementation-patient previously declined IV iron. Last vitamin B12 and folate and TSH were over a year ago.  Will repeat levels at next visit. Follow Up Instructions:  3 months for lab and MD assessment  I discussed  the assessment and treatment plan with the patient. The patient was provided an opportunity to ask questions and all were answered. The patient agreed with the plan and demonstrated an understanding of the instructions.  The patient was advised to call back or seek an in-person evaluation if the symptoms worsen or if the condition fails to improve as anticipated.   Orders Placed This Encounter  Procedures  . CBC with Differential/Platelet    Standing Status:   Future    Standing Expiration Date:   02/19/2020  . Comprehensive metabolic panel    Standing Status:   Future    Standing Expiration Date:   02/19/2020  . Iron and TIBC    Standing Status:   Future    Standing Expiration Date:   02/19/2020  . Ferritin    Standing Status:   Future    Standing Expiration Date:   02/19/2020  . Vitamin B12    Standing Status:   Future    Standing Expiration Date:   02/19/2020  . Folate    Standing Status:   Future    Standing Expiration Date:   02/19/2020  . TSH    Standing Status:   Future    Standing Expiration Date:   02/19/2020   I provided 77mnutes of face-to-face video visit time during this encounter, and > 50% was spent counseling as documented under my assessment & plan.  ZEarlie Server MD 02/20/2019 8:38 AM

## 2019-05-20 ENCOUNTER — Inpatient Hospital Stay: Payer: Medicare HMO

## 2019-05-20 ENCOUNTER — Inpatient Hospital Stay: Payer: Medicare HMO | Admitting: Oncology

## 2019-05-27 ENCOUNTER — Inpatient Hospital Stay: Payer: Medicare HMO | Attending: Oncology

## 2019-05-27 ENCOUNTER — Inpatient Hospital Stay (HOSPITAL_BASED_OUTPATIENT_CLINIC_OR_DEPARTMENT_OTHER): Payer: Medicare HMO | Admitting: Oncology

## 2019-05-27 ENCOUNTER — Encounter: Payer: Self-pay | Admitting: Oncology

## 2019-05-27 ENCOUNTER — Other Ambulatory Visit: Payer: Self-pay

## 2019-05-27 VITALS — BP 145/84 | HR 111 | Temp 97.5°F | Wt 153.4 lb

## 2019-05-27 DIAGNOSIS — N189 Chronic kidney disease, unspecified: Secondary | ICD-10-CM | POA: Insufficient documentation

## 2019-05-27 DIAGNOSIS — R5383 Other fatigue: Secondary | ICD-10-CM | POA: Diagnosis not present

## 2019-05-27 DIAGNOSIS — Z79899 Other long term (current) drug therapy: Secondary | ICD-10-CM | POA: Diagnosis not present

## 2019-05-27 DIAGNOSIS — D709 Neutropenia, unspecified: Secondary | ICD-10-CM

## 2019-05-27 DIAGNOSIS — D72819 Decreased white blood cell count, unspecified: Secondary | ICD-10-CM | POA: Diagnosis not present

## 2019-05-27 DIAGNOSIS — D631 Anemia in chronic kidney disease: Secondary | ICD-10-CM | POA: Diagnosis not present

## 2019-05-27 DIAGNOSIS — D539 Nutritional anemia, unspecified: Secondary | ICD-10-CM

## 2019-05-27 DIAGNOSIS — D638 Anemia in other chronic diseases classified elsewhere: Secondary | ICD-10-CM

## 2019-05-27 DIAGNOSIS — D61818 Other pancytopenia: Secondary | ICD-10-CM | POA: Insufficient documentation

## 2019-05-27 LAB — CBC WITH DIFFERENTIAL/PLATELET
Abs Immature Granulocytes: 0.01 10*3/uL (ref 0.00–0.07)
Basophils Absolute: 0 10*3/uL (ref 0.0–0.1)
Basophils Relative: 2 %
Eosinophils Absolute: 0 10*3/uL (ref 0.0–0.5)
Eosinophils Relative: 2 %
HCT: 28.4 % — ABNORMAL LOW (ref 36.0–46.0)
Hemoglobin: 9.2 g/dL — ABNORMAL LOW (ref 12.0–15.0)
Immature Granulocytes: 0 %
Lymphocytes Relative: 34 %
Lymphs Abs: 0.9 10*3/uL (ref 0.7–4.0)
MCH: 32.3 pg (ref 26.0–34.0)
MCHC: 32.4 g/dL (ref 30.0–36.0)
MCV: 99.6 fL (ref 80.0–100.0)
Monocytes Absolute: 0.3 10*3/uL (ref 0.1–1.0)
Monocytes Relative: 11 %
Neutro Abs: 1.4 10*3/uL — ABNORMAL LOW (ref 1.7–7.7)
Neutrophils Relative %: 51 %
Platelets: 246 10*3/uL (ref 150–400)
RBC: 2.85 MIL/uL — ABNORMAL LOW (ref 3.87–5.11)
RDW: 15.2 % (ref 11.5–15.5)
WBC: 2.7 10*3/uL — ABNORMAL LOW (ref 4.0–10.5)
nRBC: 0 % (ref 0.0–0.2)

## 2019-05-27 LAB — COMPREHENSIVE METABOLIC PANEL
ALT: 10 U/L (ref 0–44)
AST: 16 U/L (ref 15–41)
Albumin: 3.9 g/dL (ref 3.5–5.0)
Alkaline Phosphatase: 56 U/L (ref 38–126)
Anion gap: 9 (ref 5–15)
BUN: 23 mg/dL (ref 8–23)
CO2: 26 mmol/L (ref 22–32)
Calcium: 9 mg/dL (ref 8.9–10.3)
Chloride: 103 mmol/L (ref 98–111)
Creatinine, Ser: 1.16 mg/dL — ABNORMAL HIGH (ref 0.44–1.00)
GFR calc Af Amer: 51 mL/min — ABNORMAL LOW (ref >60)
GFR calc non Af Amer: 44 mL/min — ABNORMAL LOW (ref >60)
Glucose, Bld: 175 mg/dL — ABNORMAL HIGH (ref 70–99)
Potassium: 4.2 mmol/L (ref 3.5–5.1)
Sodium: 138 mmol/L (ref 135–145)
Total Bilirubin: 0.8 mg/dL (ref 0.3–1.2)
Total Protein: 8.2 g/dL — ABNORMAL HIGH (ref 6.5–8.1)

## 2019-05-27 LAB — IRON AND TIBC
Iron: 126 ug/dL (ref 28–170)
Saturation Ratios: 43 % — ABNORMAL HIGH (ref 10.4–31.8)
TIBC: 297 ug/dL (ref 250–450)
UIBC: 171 ug/dL

## 2019-05-27 LAB — VITAMIN B12: Vitamin B-12: 923 pg/mL — ABNORMAL HIGH (ref 180–914)

## 2019-05-27 LAB — TSH: TSH: 3.544 u[IU]/mL (ref 0.350–4.500)

## 2019-05-27 LAB — FERRITIN: Ferritin: 82 ng/mL (ref 11–307)

## 2019-05-27 LAB — FOLATE: Folate: 42 ng/mL (ref >5.9)

## 2019-05-27 NOTE — Progress Notes (Signed)
Hematology/Oncology Follow up note [facility] Telephone:(336) [phone] [phone]) [phone]   Patient Care Team: Earlie Server, MD as PCP - General (Oncology)  REFERRING PROVIDER: Earlie Server, MD and Whitaker, [place] VISIT Follow up for management of leukopenia and anemia.   HISTORY OF PRESENTING ILLNESS:  Martha Butler is a  84 y.o.  female with PMH listed below who was referred to me for evaluation of low white count and anemia. Patient reports that she is been feeling tired recently quite stressed. Her husband passed away approximately 1 month ago. She lives by herself. Doesn't have good appetite.   INTERVAL HISTORY Martha Butler is a 84 y.o. female who has above history reviewed by me today presents for follow up for management of chronic pancytopenia. She denies any new complaints. Feeling well at baseline.  Denies any chest pain, SOB, fever, chills, infections, recent hospitalization.  Appetite is fair.  Gained weight since last visit. Chronic fatigue stable.  Review of Systems  Constitutional: Positive for malaise/fatigue. Negative for chills, fever and weight loss.  HENT: Negative for sore throat.   Eyes: Negative for redness.  Respiratory: Negative for cough, shortness of breath and wheezing.   Cardiovascular: Negative for chest pain, palpitations and leg swelling.  Gastrointestinal: Negative for abdominal pain, blood in stool, nausea and vomiting.  Genitourinary: Negative for dysuria.  Musculoskeletal: Negative for myalgias.  Skin: Negative for rash.  Neurological: Negative for dizziness, tingling and tremors.  Endo/Heme/Allergies: Does not bruise/bleed easily.  Psychiatric/Behavioral: Negative for hallucinations.    MEDICAL HISTORY:  Past Medical History:  Diagnosis Date  . Anemia   . Depression   . GERD (gastroesophageal reflux disease)   . Hypertension     SURGICAL HISTORY: History reviewed. No pertinent surgical history.   SOCIAL HISTORY: Social History   Socioeconomic History  . Marital status: Married    Spouse name: Not on file  . Number of children: Not on file  . Years of education: Not on file  . Highest education level: Not on file  Occupational History  . Not on file  Social Needs  . Financial resource strain: Not on file  . Food insecurity    Worry: Not on file    Inability: Not on file  . Transportation needs    Medical: Not on file    Non-medical: Not on file  Tobacco Use  . Smoking status: Never Smoker  . Smokeless tobacco: Never Used  Substance and Sexual Activity  . Alcohol use: No    Frequency: Never  . Drug use: No  . Sexual activity: Not on file  Lifestyle  . Physical activity    Days per week: Not on file    Minutes per session: Not on file  . Stress: Not on file  Relationships  . Social Herbalist on phone: Not on file    Gets together: Not on file    Attends religious service: Not on file    Active member of club or organization: Not on file    Attends meetings of clubs or organizations: Not on file    Relationship status: Not on file  . Intimate partner violence    Fear of current or ex partner: Not on file    Emotionally abused: Not on file    Physically abused: Not on file    Forced sexual activity: Not on file  Other Topics Concern  . Not on file  Social History Narrative  .  Not on file    FAMILY HISTORY: History reviewed. No pertinent family history.  ALLERGIES:  has No Known Allergies.  MEDICATIONS:  Current Outpatient Medications  Medication Sig Dispense Refill  . aspirin EC 81 MG tablet Take 1 tablet by mouth daily.    . Blood Pressure Monitoring (BLOOD PRESSURE KIT) KIT     . docusate sodium (COLACE) 100 MG capsule Take 1 capsule (100 mg total) by mouth daily. Do not take if you have loose stools 90 capsule 1  . escitalopram (LEXAPRO) 5 MG tablet Take 5 mg by mouth daily.    . ferrous sulfate 325 (65 FE) MG tablet Take 1 tablet (325  mg total) by mouth daily with breakfast. 60 tablet 3  . losartan (COZAAR) 50 MG tablet 50 mg daily.    Marland Kitchen omeprazole (PRILOSEC) 20 MG capsule Take 1 capsule by mouth daily.     No current facility-administered medications for this visit.      PHYSICAL EXAMINATION: ECOG PERFORMANCE STATUS: 1 - Symptomatic but completely ambulatory Vitals:   05/27/19 0917  BP: (!) 145/84  Pulse: (!) 111  Temp: (!) 97.5 F (36.4 C)   Filed Weights   05/27/19 0917  Weight: 153 lb 6.4 oz (69.6 kg)    Physical Exam  Constitutional: She is oriented to person, place, and time and well-developed, well-nourished, and in no distress. No distress.  HENT:  Head: Normocephalic and atraumatic.  Nose: Nose normal.  Mouth/Throat: Oropharynx is clear and moist. No oropharyngeal exudate.  Eyes: Pupils are equal, round, and reactive to light. EOM are normal. Right eye exhibits no discharge. Left eye exhibits no discharge. No scleral icterus.  Neck: Normal range of motion. Neck supple. No JVD present.  Cardiovascular: Normal rate and regular rhythm.  Murmur heard. Systolic murmur  Pulmonary/Chest: Effort normal and breath sounds normal. No respiratory distress. She has no wheezes. She has no rales. She exhibits no tenderness.  Abdominal: Soft. Bowel sounds are normal. She exhibits no distension and no mass. There is no abdominal tenderness. There is no rebound.  Musculoskeletal: Normal range of motion.        General: No tenderness or edema.  Lymphadenopathy:    She has no cervical adenopathy.  Neurological: She is alert and oriented to person, place, and time. No cranial nerve deficit. She exhibits normal muscle tone. Coordination normal.  Skin: Skin is warm and dry. No rash noted. She is not diaphoretic. No erythema.  Psychiatric: Affect and judgment normal.     LABORATORY DATA:  I have reviewed the data as listed Lab Results  Component Value Date   WBC 2.7 (L) 05/27/2019   HGB 9.2 (L) 05/27/2019   HCT  28.4 (L) 05/27/2019   MCV 99.6 05/27/2019   PLT 246 05/27/2019   Recent Labs    11/18/18 0952 05/27/19 0857  NA 142 138  K 4.4 4.2  CL 104 103  CO2 30 26  GLUCOSE 154* 175*  BUN 24* 23  CREATININE 1.11* 1.16*  CALCIUM 9.2 9.0  GFRNONAA 47* 44*  GFRAA 54* 51*  PROT 8.3* 8.2*  ALBUMIN 4.0 3.9  AST 17 16  ALT 9 10  ALKPHOS 55 56  BILITOT 0.3 0.8    Normal vitamin B12, Folate, iron/TIBC, ferritin, TSH normal. No monoclonal protein. Normal LDH, TSH.   ASSESSMENT & PLAN:  1. Anemia of chronic disease   2. Macrocytic anemia   3. Neutropenia, unspecified type ([place])   4. Other fatigue    #Anemia  of CKD.  Labs are reviewed and discussed with patient. Hemoglobin has slightly improved to 9.2,  Continue to monitor.  Iron panel was reviewed, ferritin has improved to 82, iron saturation slightly elevated.  In the context of CKD, recommend patient to continue oral ferrous sulfate supplementation to keep iron store sufficient. Procrit can be considered if hemoglobin does not improve.    Leukopenia/neutropenia, stable.  Cannot exclude MDS. Patient declines bone marrow biopsy.  Repeat iron panel at the next visit in 3 months.   All questions were answered. The patient knows to call the clinic with any problems questions or concerns.  Return of visit: 3 months with repeat cbc, iron ferritin.  Earlie Server, MD, PhD Hematology Oncology [place] at [facility] 05/27/2019

## 2019-09-26 ENCOUNTER — Other Ambulatory Visit: Payer: Medicare HMO

## 2019-09-26 ENCOUNTER — Ambulatory Visit: Payer: Medicare HMO | Admitting: Oncology

## 2019-09-30 ENCOUNTER — Encounter: Payer: Self-pay | Admitting: Oncology

## 2019-09-30 ENCOUNTER — Other Ambulatory Visit: Payer: Self-pay

## 2019-09-30 ENCOUNTER — Inpatient Hospital Stay (HOSPITAL_BASED_OUTPATIENT_CLINIC_OR_DEPARTMENT_OTHER): Payer: Medicare HMO | Admitting: Oncology

## 2019-09-30 ENCOUNTER — Inpatient Hospital Stay: Payer: Medicare HMO | Attending: Oncology

## 2019-09-30 VITALS — BP 142/85 | HR 109 | Temp 98.6°F | Resp 18 | Wt 152.8 lb

## 2019-09-30 DIAGNOSIS — D709 Neutropenia, unspecified: Secondary | ICD-10-CM

## 2019-09-30 DIAGNOSIS — F329 Major depressive disorder, single episode, unspecified: Secondary | ICD-10-CM | POA: Diagnosis not present

## 2019-09-30 DIAGNOSIS — D631 Anemia in chronic kidney disease: Secondary | ICD-10-CM

## 2019-09-30 DIAGNOSIS — D638 Anemia in other chronic diseases classified elsewhere: Secondary | ICD-10-CM

## 2019-09-30 DIAGNOSIS — I129 Hypertensive chronic kidney disease with stage 1 through stage 4 chronic kidney disease, or unspecified chronic kidney disease: Secondary | ICD-10-CM | POA: Insufficient documentation

## 2019-09-30 DIAGNOSIS — Z79899 Other long term (current) drug therapy: Secondary | ICD-10-CM | POA: Diagnosis not present

## 2019-09-30 DIAGNOSIS — D539 Nutritional anemia, unspecified: Secondary | ICD-10-CM | POA: Diagnosis not present

## 2019-09-30 DIAGNOSIS — D72819 Decreased white blood cell count, unspecified: Secondary | ICD-10-CM | POA: Insufficient documentation

## 2019-09-30 DIAGNOSIS — Z7982 Long term (current) use of aspirin: Secondary | ICD-10-CM | POA: Insufficient documentation

## 2019-09-30 DIAGNOSIS — R5383 Other fatigue: Secondary | ICD-10-CM | POA: Diagnosis not present

## 2019-09-30 DIAGNOSIS — N189 Chronic kidney disease, unspecified: Secondary | ICD-10-CM

## 2019-09-30 DIAGNOSIS — K219 Gastro-esophageal reflux disease without esophagitis: Secondary | ICD-10-CM | POA: Insufficient documentation

## 2019-09-30 LAB — CBC WITH DIFFERENTIAL/PLATELET
Abs Immature Granulocytes: 0 10*3/uL (ref 0.00–0.07)
Basophils Absolute: 0 10*3/uL (ref 0.0–0.1)
Basophils Relative: 1 %
Eosinophils Absolute: 0 10*3/uL (ref 0.0–0.5)
Eosinophils Relative: 2 %
HCT: 29.3 % — ABNORMAL LOW (ref 36.0–46.0)
Hemoglobin: 9.4 g/dL — ABNORMAL LOW (ref 12.0–15.0)
Immature Granulocytes: 0 %
Lymphocytes Relative: 43 %
Lymphs Abs: 0.9 10*3/uL (ref 0.7–4.0)
MCH: 33 pg (ref 26.0–34.0)
MCHC: 32.1 g/dL (ref 30.0–36.0)
MCV: 102.8 fL — ABNORMAL HIGH (ref 80.0–100.0)
Monocytes Absolute: 0.2 10*3/uL (ref 0.1–1.0)
Monocytes Relative: 7 %
Neutro Abs: 0.9 10*3/uL — ABNORMAL LOW (ref 1.7–7.7)
Neutrophils Relative %: 47 %
Platelets: 236 10*3/uL (ref 150–400)
RBC: 2.85 MIL/uL — ABNORMAL LOW (ref 3.87–5.11)
RDW: 15.4 % (ref 11.5–15.5)
WBC: 2 10*3/uL — ABNORMAL LOW (ref 4.0–10.5)
nRBC: 0 % (ref 0.0–0.2)

## 2019-09-30 LAB — IRON AND TIBC
Iron: 127 ug/dL (ref 28–170)
Saturation Ratios: 41 % — ABNORMAL HIGH (ref 10.4–31.8)
TIBC: 308 ug/dL (ref 250–450)
UIBC: 181 ug/dL

## 2019-09-30 LAB — FERRITIN: Ferritin: 95 ng/mL (ref 11–307)

## 2019-09-30 NOTE — Progress Notes (Signed)
Patient does not offer any problems today.  

## 2019-10-01 NOTE — Progress Notes (Signed)
Hematology/Oncology Follow up note [facility] Telephone:(336) [phone] Fax:(336) [phone]   Patient Care Team: Earlie Server, MD as PCP - General (Oncology)  REFERRING PROVIDER: Earlie Server, MD and Whitaker, [place] VISIT Follow up for management of leukopenia and anemia.   HISTORY OF PRESENTING ILLNESS:  Martha Butler is a  84 y.o.  female with PMH listed below who was referred to me for evaluation of low white count and anemia. Patient reports that she is been feeling tired recently quite stressed. Her husband passed away approximately 1 month ago. She lives by herself. Doesn't have good appetite.   INTERVAL HISTORY Martha Butler is a 84 y.o. female who has above history reviewed by me today presents for follow up for management of chronic pancytopenia. Patient was accompanied by patient's grandson. She reports feeling tired and fatigued. Appetite has decreased.  Not eating much. Denies any chest pain, shortness of breath, fever, chills, infections or recent hospitalization. Chronic fatigue is getting worse . Review of Systems  Constitutional: Positive for malaise/fatigue. Negative for chills, fever and weight loss.       No appetite  HENT: Negative for sore throat.   Eyes: Negative for redness.  Respiratory: Negative for cough, shortness of breath and wheezing.   Cardiovascular: Negative for chest pain, palpitations and leg swelling.  Gastrointestinal: Negative for abdominal pain, blood in stool, nausea and vomiting.  Genitourinary: Negative for dysuria.  Musculoskeletal: Negative for myalgias.  Skin: Negative for rash.  Neurological: Negative for dizziness, tingling and tremors.  Endo/Heme/Allergies: Does not bruise/bleed easily.  Psychiatric/Behavioral: Negative for hallucinations.    MEDICAL HISTORY:  Past Medical History:  Diagnosis Date  . Anemia   . Depression   . GERD (gastroesophageal reflux disease)   . Hypertension     SURGICAL  HISTORY: History reviewed. No pertinent surgical history.  SOCIAL HISTORY: Social History   Socioeconomic History  . Marital status: Married    Spouse name: Not on file  . Number of children: Not on file  . Years of education: Not on file  . Highest education level: Not on file  Occupational History  . Not on file  Social Needs  . Financial resource strain: Not on file  . Food insecurity    Worry: Not on file    Inability: Not on file  . Transportation needs    Medical: Not on file    Non-medical: Not on file  Tobacco Use  . Smoking status: Never Smoker  . Smokeless tobacco: Never Used  Substance and Sexual Activity  . Alcohol use: No    Frequency: Never  . Drug use: No  . Sexual activity: Not on file  Lifestyle  . Physical activity    Days per week: Not on file    Minutes per session: Not on file  . Stress: Not on file  Relationships  . Social Herbalist on phone: Not on file    Gets together: Not on file    Attends religious service: Not on file    Active member of club or organization: Not on file    Attends meetings of clubs or organizations: Not on file    Relationship status: Not on file  . Intimate partner violence    Fear of current or ex partner: Not on file    Emotionally abused: Not on file    Physically abused: Not on file    Forced sexual activity: Not on file  Other Topics  Concern  . Not on file  Social History Narrative  . Not on file    FAMILY HISTORY: History reviewed. No pertinent family history.  ALLERGIES:  has No Known Allergies.  MEDICATIONS:  Current Outpatient Medications  Medication Sig Dispense Refill  . aspirin EC 81 MG tablet Take 1 tablet by mouth daily.    . Blood Pressure Monitoring (BLOOD PRESSURE KIT) KIT     . docusate sodium (COLACE) 100 MG capsule Take 1 capsule (100 mg total) by mouth daily. Do not take if you have loose stools 90 capsule 1  . escitalopram (LEXAPRO) 5 MG tablet Take 5 mg by mouth daily.     . ferrous sulfate 325 (65 FE) MG tablet Take 1 tablet (325 mg total) by mouth daily with breakfast. 60 tablet 3  . losartan (COZAAR) 50 MG tablet 50 mg daily.    Marland Kitchen omeprazole (PRILOSEC) 20 MG capsule Take 1 capsule by mouth daily.     No current facility-administered medications for this visit.      PHYSICAL EXAMINATION: ECOG PERFORMANCE STATUS: 1 - Symptomatic but completely ambulatory Vitals:   09/30/19 1311  BP: (!) 142/85  Pulse: (!) 109  Resp: 18  Temp: 98.6 F (37 C)   Filed Weights   09/30/19 1311  Weight: 152 lb 12.8 oz (69.3 kg)    Physical Exam  Constitutional: She is oriented to person, place, and time and well-developed, well-nourished, and in no distress. No distress.  HENT:  Head: Normocephalic and atraumatic.  Nose: Nose normal.  Mouth/Throat: Oropharynx is clear and moist. No oropharyngeal exudate.  Eyes: Pupils are equal, round, and reactive to light. EOM are normal. Right eye exhibits no discharge. Left eye exhibits no discharge. No scleral icterus.  Neck: Normal range of motion. Neck supple. No JVD present.  Cardiovascular: Normal rate and regular rhythm.  Murmur heard. Systolic murmur  Pulmonary/Chest: Effort normal and breath sounds normal. No respiratory distress. She has no wheezes. She has no rales. She exhibits no tenderness.  Abdominal: Soft. Bowel sounds are normal. She exhibits no distension and no mass. There is no abdominal tenderness. There is no rebound.  Musculoskeletal: Normal range of motion.        General: No tenderness or edema.  Lymphadenopathy:    She has no cervical adenopathy.  Neurological: She is alert and oriented to person, place, and time. No cranial nerve deficit. She exhibits normal muscle tone. Coordination normal.  Skin: Skin is warm and dry. No rash noted. She is not diaphoretic. No erythema.  Psychiatric: Affect and judgment normal.     LABORATORY DATA:  I have reviewed the data as listed Lab Results  Component  Value Date   WBC 2.0 (L) 09/30/2019   HGB 9.4 (L) 09/30/2019   HCT 29.3 (L) 09/30/2019   MCV 102.8 (H) 09/30/2019   PLT 236 09/30/2019   Recent Labs    11/18/18 0952 05/27/19 0857  NA 142 138  K 4.4 4.2  CL 104 103  CO2 30 26  GLUCOSE 154* 175*  BUN 24* 23  CREATININE 1.11* 1.16*  CALCIUM 9.2 9.0  GFRNONAA 47* 44*  GFRAA 54* 51*  PROT 8.3* 8.2*  ALBUMIN 4.0 3.9  AST 17 16  ALT 9 10  ALKPHOS 55 56  BILITOT 0.3 0.8    Normal vitamin B12, Folate, iron/TIBC, ferritin, TSH normal. No monoclonal protein. Normal LDH, TSH.   ASSESSMENT & PLAN:  1. Anemia of chronic disease   2. Macrocytic anemia  3. Neutropenia, unspecified type ([facility])    #Leukopenia, predominantly neutropenia.  [place]ock dropped to 0.9. Patient is asymptomatic.  No fever or chills or recent infections. I discussed again with patient I recommend bone marrow biopsy.  Rationale and side effects of bone marrow biopsy were discussed with patient. I also called patient's son Coralyn Mark and discussed with him over the phone.  He agrees with the plan and convinced patient. Patient agrees with the plan. We will arrange bone marrow biopsy for further evaluation for leukopenia and anemia. Rule out MDS.  #Anemia, hemoglobin 9.4, stable.  Multifactorial, anemia of chronic disease, CKD, or underlying bone marrow problems. Iron levels were reviewed.  Tests were available after visits. Ferritin 95, iron saturation 41.  Adequate iron stores.  In the setting of CKD, continue oral ferrous sulfate 325 mg daily.  Prescription sent to pharmacy Patient will see me 1 week after the bone marrow biopsy. All questions were answered. The patient knows to call the clinic with any problems questions or concerns. We spent sufficient time to discuss many aspect of care, questions were answered to patient's satisfaction. Total face to face encounter time for this patient visit was 25 min. >50% of the time was  spent in counseling and coordination of  care.   Earlie Server, MD, PhD Hematology Oncology [place] at [facility] 10/01/2019

## 2019-10-02 ENCOUNTER — Telehealth: Payer: Self-pay

## 2019-10-02 ENCOUNTER — Telehealth: Payer: Self-pay | Admitting: *Deleted

## 2019-10-02 NOTE — Telephone Encounter (Signed)
Received call from [facility] in centralized scheduling with patient's bone marrow appt details. Patient scheduled for biopsy on tuesday dec 15, has to be there at 7:30am for 8:30 appt.

## 2019-10-03 NOTE — Telephone Encounter (Signed)
I have tried to call patient multiple times but there is no answer. I left a detailed VM and will be mailing appointment reminders.

## 2019-10-11 ENCOUNTER — Other Ambulatory Visit: Payer: Self-pay | Admitting: Radiology

## 2019-10-13 ENCOUNTER — Other Ambulatory Visit: Payer: Self-pay | Admitting: Student

## 2019-10-14 ENCOUNTER — Ambulatory Visit
Admission: RE | Admit: 2019-10-14 | Discharge: 2019-10-14 | Disposition: A | Discharge status: Home / Self Care | Payer: Medicare HMO | Source: Ambulatory Visit | Attending: Oncology | Admitting: Oncology

## 2019-10-14 ENCOUNTER — Other Ambulatory Visit: Payer: Self-pay

## 2019-10-14 DIAGNOSIS — F329 Major depressive disorder, single episode, unspecified: Secondary | ICD-10-CM | POA: Diagnosis not present

## 2019-10-14 DIAGNOSIS — K219 Gastro-esophageal reflux disease without esophagitis: Secondary | ICD-10-CM | POA: Diagnosis not present

## 2019-10-14 DIAGNOSIS — Z7982 Long term (current) use of aspirin: Secondary | ICD-10-CM | POA: Insufficient documentation

## 2019-10-14 DIAGNOSIS — D72819 Decreased white blood cell count, unspecified: Secondary | ICD-10-CM | POA: Diagnosis not present

## 2019-10-14 DIAGNOSIS — D6489 Other specified anemias: Secondary | ICD-10-CM | POA: Diagnosis not present

## 2019-10-14 DIAGNOSIS — I10 Essential (primary) hypertension: Secondary | ICD-10-CM | POA: Diagnosis not present

## 2019-10-14 DIAGNOSIS — Z79899 Other long term (current) drug therapy: Secondary | ICD-10-CM | POA: Diagnosis not present

## 2019-10-14 DIAGNOSIS — D649 Anemia, unspecified: Secondary | ICD-10-CM | POA: Diagnosis not present

## 2019-10-14 DIAGNOSIS — D638 Anemia in other chronic diseases classified elsewhere: Secondary | ICD-10-CM | POA: Insufficient documentation

## 2019-10-14 DIAGNOSIS — D709 Neutropenia, unspecified: Secondary | ICD-10-CM | POA: Diagnosis not present

## 2019-10-14 LAB — CBC WITH DIFFERENTIAL/PLATELET
Abs Immature Granulocytes: 0.02 10*3/uL (ref 0.00–0.07)
Basophils Absolute: 0.1 10*3/uL (ref 0.0–0.1)
Basophils Relative: 2 %
Eosinophils Absolute: 0 10*3/uL (ref 0.0–0.5)
Eosinophils Relative: 2 %
HCT: 27.3 % — ABNORMAL LOW (ref 36.0–46.0)
Hemoglobin: 9.2 g/dL — ABNORMAL LOW (ref 12.0–15.0)
Immature Granulocytes: 1 %
Lymphocytes Relative: 46 %
Lymphs Abs: 1 10*3/uL (ref 0.7–4.0)
MCH: 32.9 pg (ref 26.0–34.0)
MCHC: 33.7 g/dL (ref 30.0–36.0)
MCV: 97.5 fL (ref 80.0–100.0)
Monocytes Absolute: 0.2 10*3/uL (ref 0.1–1.0)
Monocytes Relative: 9 %
Neutro Abs: 0.9 10*3/uL — ABNORMAL LOW (ref 1.7–7.7)
Neutrophils Relative %: 40 %
Platelets: 226 10*3/uL (ref 150–400)
RBC: 2.8 MIL/uL — ABNORMAL LOW (ref 3.87–5.11)
RDW: 15.8 % — ABNORMAL HIGH (ref 11.5–15.5)
Smear Review: NORMAL
WBC: 2.1 10*3/uL — ABNORMAL LOW (ref 4.0–10.5)
nRBC: 0 % (ref 0.0–0.2)

## 2019-10-14 LAB — PROTIME-INR
INR: 1.1 (ref 0.8–1.2)
Prothrombin Time: 13.9 seconds (ref 11.4–15.2)

## 2019-10-14 MED ORDER — MIDAZOLAM HCL 2 MG/2ML IJ SOLN
INTRAMUSCULAR | Status: AC | PRN
  Administered 2019-10-14: 1 mg via INTRAVENOUS

## 2019-10-14 MED ORDER — FENTANYL CITRATE (PF) 100 MCG/2ML IJ SOLN
INTRAMUSCULAR | Status: AC | PRN
  Administered 2019-10-14: 50 ug via INTRAVENOUS

## 2019-10-14 MED ORDER — FENTANYL CITRATE (PF) 100 MCG/2ML IJ SOLN
INTRAMUSCULAR | Status: AC
  Filled 2019-10-14: qty 2

## 2019-10-14 MED ORDER — MIDAZOLAM HCL 5 MG/5ML IJ SOLN
INTRAMUSCULAR | Status: AC
  Filled 2019-10-14: qty 5

## 2019-10-14 MED ORDER — SODIUM CHLORIDE 0.9 % IV SOLN: INTRAVENOUS | Status: DC

## 2019-10-14 MED ORDER — HEPARIN SOD (PORK) LOCK FLUSH 100 UNIT/ML IV SOLN
INTRAVENOUS | Status: AC
  Filled 2019-10-14: qty 5

## 2019-10-14 NOTE — Progress Notes (Signed)
Informed Dr. Anselm Pancoast of patient appearing to go in and out of a-fib. Per the patient and her son she does not have history of this. EKG was performed but a-fib was not captured, will continue efforts to capture arrhythmia. PCP will be informed per Dr. Anselm Pancoast if a-fib ekg can be obtained.

## 2019-10-14 NOTE — Discharge Instructions (Signed)
Bone Marrow Aspiration and Bone Marrow Biopsy, Adult, Care After °This sheet gives you information about how to care for yourself after your procedure. Your health care provider may also give you more specific instructions. If you have problems or questions, contact your health care provider. °What can I expect after the procedure? °After the procedure, it is common to have: °· Mild pain and tenderness. °· Swelling. °· Bruising. °Follow these instructions at home: °Puncture site care ° °  ° °· Follow instructions from your health care provider about how to take care of the puncture site. Make sure you: °? Wash your hands with soap and water before you change your bandage (dressing). If soap and water are not available, use hand sanitizer. °? Change your dressing as told by your health care provider. °· Check your puncture site every day for signs of infection. Check for: °? More redness, swelling, or pain. °? More fluid or blood. °? Warmth. °? Pus or a bad smell. °General instructions °· Take over-the-counter and prescription medicines only as told by your health care provider. °· Do not take baths, swim, or use a hot tub until your health care provider approves. Ask if you can take a shower or have a sponge bath. °· Return to your normal activities as told by your health care provider. Ask your health care provider what activities are safe for you. °· Do not drive for 24 hours if you were given a medicine to help you relax (sedative) during your procedure. °· Keep all follow-up visits as told by your health care provider. This is important. °Contact a health care provider if: °· Your pain is not controlled with medicine. °Get help right away if: °· You have a fever. °· You have more redness, swelling, or pain around the puncture site. °· You have more fluid or blood coming from the puncture site. °· Your puncture site feels warm to the touch. °· You have pus or a bad smell coming from the puncture site. °These  symptoms may represent a serious problem that is an emergency. Do not wait to see if the symptoms will go away. Get medical help right away. Call your local emergency services (911 in the [place] Do not drive yourself to the hospital. °Summary °· After the procedure, it is common to have mild pain, tenderness, swelling, and bruising. °· Follow instructions from your health care provider about how to take care of the puncture site. °· Get help right away if you have any symptoms of infection or if you have more blood or fluid coming from the puncture site. °This information is not intended to replace advice given to you by your health care provider. Make sure you discuss any questions you have with your health care provider. °Document Released: 05/05/2005 Document Revised: 01/29/2018 Document Reviewed: 03/29/2016 °Elsevier Patient Education © 2020 [employer] ° ° ° ° °Moderate Conscious Sedation, Adult, Care After °These instructions provide you with information about caring for yourself after your procedure. Your health care provider may also give you more specific instructions. Your treatment has been planned according to current medical practices, but problems sometimes occur. Call your health care provider if you have any problems or questions after your procedure. °What can I expect after the procedure? °After your procedure, it is common: °· To feel sleepy for several hours. °· To feel clumsy and have poor balance for several hours. °· To have poor judgment for several hours. °· To vomit if you eat too soon. °  Follow these instructions at home: °For at least 24 hours after the procedure: ° °· Do not: °? Participate in activities where you could fall or become injured. °? Drive. °? Use heavy machinery. °? Drink alcohol. °? Take sleeping pills or medicines that cause drowsiness. °? Make important decisions or sign legal documents. °? Take care of children on your own. °· Rest. °Eating and drinking °· Follow the  diet recommended by your health care provider. °· If you vomit: °? Drink water, juice, or soup when you can drink without vomiting. °? Make sure you have little or no nausea before eating solid foods. °General instructions °· Have a responsible adult stay with you until you are awake and alert. °· Take over-the-counter and prescription medicines only as told by your health care provider. °· If you smoke, do not smoke without supervision. °· Keep all follow-up visits as told by your health care provider. This is important. °Contact a health care provider if: °· You keep feeling nauseous or you keep vomiting. °· You feel light-headed. °· You develop a rash. °· You have a fever. °Get help right away if: °· You have trouble breathing. °This information is not intended to replace advice given to you by your health care provider. Make sure you discuss any questions you have with your health care provider. °Document Released: 08/06/2013 Document Revised: 09/28/2017 Document Reviewed: 02/05/2016 °Elsevier Patient Education © 2020 [employer] Inc[employer] ° °

## 2019-10-14 NOTE — Sedation Documentation (Signed)
Versed 1 mg IV, Fentanyl 50 mcg IV. Pt. Tolerated sedation well.

## 2019-10-14 NOTE — Procedures (Signed)
Interventional Radiology Procedure:   Indications: Anemia and neutropenia  Procedure: CT guided bone marrow biopsy  Findings: 2 aspirates and 1 core from right ilium  Complications: None     EBL: Minimal, less than 10 ml  Plan: Discharge to home in one hour.   Joden Bonsall R. Anselm Pancoast, MD  Pager: [phone]

## 2019-10-14 NOTE — Consult Note (Signed)
Chief Complaint: Patient was seen in consultation today for CT-guided bone marrow biopsy at the request of Yu,Zhou  Referring Physician(s): Yu,Zhou   Patient Status: [facility] - Out-pt  History of Present Illness: Martha Butler is a 84 y.o. female with history of neutropenia and anemia.  Patient presents for CT-guided bone marrow biopsy.  Patient has no complaints today.  According to her son, she has lost a little bit of weight and decreased appetite. She denies chest pain, difficulty breathing, abdominal pain, GI issues or GU issues.  Past Medical History:  Diagnosis Date  . Anemia   . Depression   . GERD (gastroesophageal reflux disease)   . Hypertension     History reviewed. No pertinent surgical history.  Allergies: Patient has no known allergies.  Medications: Prior to Admission medications   Medication Sig Start Date End Date Taking? Authorizing Provider  aspirin EC 81 MG tablet Take 1 tablet by mouth daily.   Yes [provider]  docusate sodium (COLACE) 100 MG capsule Take 1 capsule (100 mg total) by mouth daily. Do not take if you have loose stools 02/19/19  Yes Earlie Server, MD  escitalopram (LEXAPRO) 5 MG tablet Take 5 mg by mouth daily. 06/26/18  Yes [provider]  ferrous sulfate 325 (65 FE) MG tablet Take 1 tablet (325 mg total) by mouth daily with breakfast. 02/19/19  Yes Earlie Server, MD  losartan (COZAAR) 50 MG tablet 50 mg daily. 02/25/18  Yes [provider]  omeprazole (PRILOSEC) 20 MG capsule Take 1 capsule by mouth daily. 11/02/17  Yes [provider]  Blood Pressure Monitoring (BLOOD PRESSURE KIT) KIT  08/17/17   [provider]     History reviewed. No pertinent family history.  Social History   Socioeconomic History  . Marital status: Widowed    Spouse name: Not on file  . Number of children: 3  . Years of education: Not on file  . Highest education level: Not on file  Occupational History  . Not on file    Tobacco Use  . Smoking status: Never Smoker  . Smokeless tobacco: Never Used  Substance and Sexual Activity  . Alcohol use: No  . Drug use: No  . Sexual activity: Not on file  Other Topics Concern  . Not on file  Social History Narrative  . Not on file   Social Determinants of Health   Financial Resource Strain:   . Difficulty of Paying Living Expenses: Not on file  Food Insecurity:   . Worried About Charity fundraiser in the Last Year: Not on file  . Ran Out of Food in the Last Year: Not on file  Transportation Needs:   . Lack of Transportation (Medical): Not on file  . Lack of Transportation (Non-Medical): Not on file  Physical Activity:   . Days of Exercise per Week: Not on file  . Minutes of Exercise per Session: Not on file  Stress:   . Feeling of Stress : Not on file  Social Connections:   . Frequency of Communication with Friends and Family: Not on file  . Frequency of Social Gatherings with Friends and Family: Not on file  . Attends Religious Services: Not on file  . Active Member of Clubs or Organizations: Not on file  . Attends Archivist Meetings: Not on file  . Marital Status: Not on file    ECOG Status:   Review of Systems: A 12 point ROS discussed and  pertinent positives are indicated in the HPI above.  All other systems are negative.  Review of Systems  Constitutional: Positive for appetite change.  Respiratory: Negative.   Cardiovascular: Negative.   Gastrointestinal: Negative.   Genitourinary: Negative.     Vital Signs: Pulse 92   Temp 98.5 F (36.9 C) (Oral)   Resp 20   Ht 5' 9"  (1.753 m)   Wt 69.3 kg   SpO2 100%   BMI 22.56 kg/m   Physical Exam Constitutional:      Appearance: She is not ill-appearing.  Cardiovascular:     Rate and Rhythm: Normal rate and regular rhythm.     Heart sounds: Normal heart sounds.  Pulmonary:     Effort: Pulmonary effort is normal.     Breath sounds: Normal breath sounds.  Abdominal:      General: Abdomen is flat. Bowel sounds are normal.     Palpations: Abdomen is soft.  Neurological:     Mental Status: She is alert.     Imaging: No results found.  Labs:  CBC: Recent Labs    02/17/19 1107 05/27/19 0857 09/30/19 1257 10/14/19 0738  WBC 2.3* 2.7* 2.0* 2.1*  HGB 9.1* 9.2* 9.4* 9.2*  HCT 27.8* 28.4* 29.3* 27.3*  PLT 200 246 236 226    COAGS: Recent Labs    10/14/19 0738  INR 1.1    BMP: Recent Labs    11/18/18 0952 05/27/19 0857  NA 142 138  K 4.4 4.2  CL 104 103  CO2 30 26  GLUCOSE 154* 175*  BUN 24* 23  CALCIUM 9.2 9.0  CREATININE 1.11* 1.16*  GFRNONAA 47* 44*  GFRAA 54* 51*    LIVER FUNCTION TESTS: Recent Labs    11/18/18 0952 05/27/19 0857  BILITOT 0.3 0.8  AST 17 16  ALT 9 10  ALKPHOS 55 56  PROT 8.3* 8.2*  ALBUMIN 4.0 3.9    TUMOR MARKERS: No results for input(s): AFPTM, CEA, CA199, CHROMGRNA in the last 8760 hours.  Assessment and Plan:  84 year old with anemia and neutropenia.  Patient presents for a CT-guided bone marrow biopsy.  Risks and benefits of bone marrow biopsy was discussed with the patient and/or patient's family including, but not limited to bleeding, infection, damage to adjacent structures or low yield requiring additional tests. All of the questions were answered and there is agreement to proceed. Consent signed and in chart.   Thank you for this interesting consult.  I greatly enjoyed meeting Martha Butler and look forward to participating in their care.  A copy of this report was sent to the requesting provider on this date.  Electronically Signed: Burman Riis, MD 10/14/2019, 8:37 AM   I spent a total of 15 Minutes   in face to face in clinical consultation, greater than 50% of which was counseling/coordinating care for CT-guided bone marrow biopsy.

## 2019-10-15 ENCOUNTER — Other Ambulatory Visit: Payer: Self-pay

## 2019-10-16 LAB — SURGICAL PATHOLOGY

## 2019-10-21 ENCOUNTER — Encounter (HOSPITAL_COMMUNITY): Payer: Self-pay | Admitting: Oncology

## 2019-10-22 ENCOUNTER — Other Ambulatory Visit: Payer: Self-pay

## 2019-10-22 ENCOUNTER — Inpatient Hospital Stay: Payer: Medicare HMO | Admitting: Oncology

## 2019-10-30 ENCOUNTER — Inpatient Hospital Stay: Payer: Medicare HMO | Attending: Oncology | Admitting: Oncology

## 2019-10-30 ENCOUNTER — Encounter: Payer: Self-pay | Admitting: Oncology

## 2019-10-30 DIAGNOSIS — D709 Neutropenia, unspecified: Secondary | ICD-10-CM

## 2019-10-30 DIAGNOSIS — D539 Nutritional anemia, unspecified: Secondary | ICD-10-CM

## 2019-10-30 DIAGNOSIS — D638 Anemia in other chronic diseases classified elsewhere: Secondary | ICD-10-CM

## 2019-10-30 NOTE — Progress Notes (Signed)
Patient verified using two identifiers for virtual visit via telephone today.  Patient does not offer any problems today.  

## 2019-10-30 NOTE — Progress Notes (Signed)
HEMATOLOGY-ONCOLOGY TeleHEALTH VISIT PROGRESS NOTE  I connected with Martha Butler on 10/30/19 at 11:00 AM EST by video enabled telemedicine visit and verified that I am speaking with the correct person using two identifiers. I discussed the limitations, risks, security and privacy concerns of performing an evaluation and management service by telemedicine and the availability of in-person appointments. I also discussed with the patient that there may be a patient responsible charge related to this service. The patient expressed understanding and agreed to proceed.   Other persons participating in the visit and their role in the encounter:  Son Coralyn Mark to help patient to set up virtual visit.   Patient's location: Home  Provider's location: office Chief Complaint: leukopenia.    INTERVAL HISTORY Martha Butler is a 84 y.o. female who has above history reviewed by me today presents for follow up visit for management of leukopenia Problems and complaints are listed below:  Patient reports feeling well at baseline.  Chronic fatigue unchanged.  Patient has had a bone marrow biopsy done during the interval.  She presents virtually to discuss results.  Review of Systems - Oncology  ROS  Past Medical History:  Diagnosis Date  . Anemia   . Depression   . GERD (gastroesophageal reflux disease)   . Hypertension    History reviewed. No pertinent surgical history.  History reviewed. No pertinent family history.  Social History   Socioeconomic History  . Marital status: Widowed    Spouse name: Not on file  . Number of children: 3  . Years of education: Not on file  . Highest education level: Not on file  Occupational History  . Not on file  Tobacco Use  . Smoking status: Never Smoker  . Smokeless tobacco: Never Used  Substance and Sexual Activity  . Alcohol use: No  . Drug use: No  . Sexual activity: Not on file  Other Topics Concern  . Not on file  Social History Narrative  .  Not on file   Social Determinants of Health   Financial Resource Strain:   . Difficulty of Paying Living Expenses: Not on file  Food Insecurity:   . Worried About Charity fundraiser in the Last Year: Not on file  . Ran Out of Food in the Last Year: Not on file  Transportation Needs:   . Lack of Transportation (Medical): Not on file  . Lack of Transportation (Non-Medical): Not on file  Physical Activity:   . Days of Exercise per Week: Not on file  . Minutes of Exercise per Session: Not on file  Stress:   . Feeling of Stress : Not on file  Social Connections:   . Frequency of Communication with Friends and Family: Not on file  . Frequency of Social Gatherings with Friends and Family: Not on file  . Attends Religious Services: Not on file  . Active Member of Clubs or Organizations: Not on file  . Attends Archivist Meetings: Not on file  . Marital Status: Not on file  Intimate Partner Violence:   . Fear of Current or Ex-Partner: Not on file  . Emotionally Abused: Not on file  . Physically Abused: Not on file  . Sexually Abused: Not on file    Current Outpatient Medications on File Prior to Visit  Medication Sig Dispense Refill  . aspirin EC 81 MG tablet Take 1 tablet by mouth daily.    . Blood Pressure Monitoring (BLOOD PRESSURE KIT) KIT     .  docusate sodium (COLACE) 100 MG capsule Take 1 capsule (100 mg total) by mouth daily. Do not take if you have loose stools 90 capsule 1  . escitalopram (LEXAPRO) 5 MG tablet Take 5 mg by mouth daily.    . ferrous sulfate 325 (65 FE) MG tablet Take 1 tablet (325 mg total) by mouth daily with breakfast. 60 tablet 3  . losartan (COZAAR) 50 MG tablet 50 mg daily.    Marland Kitchen omeprazole (PRILOSEC) 20 MG capsule Take 1 capsule by mouth daily.     No current facility-administered medications on file prior to visit.    No Known Allergies     Observations/Objective: Today's Vitals   10/30/19 1054  PainSc: 0-No pain   There is no height  or weight on file to calculate BMI.  GENERAL: No distress,   CBC    Component Value Date/Time   WBC 2.1 (L) 10/14/2019 0738   RBC 2.80 (L) 10/14/2019 0738   HGB 9.2 (L) 10/14/2019 0738   HCT 27.3 (L) 10/14/2019 0738   PLT 226 10/14/2019 0738   MCV 97.5 10/14/2019 0738   MCH 32.9 10/14/2019 0738   MCHC 33.7 10/14/2019 0738   RDW 15.8 (H) 10/14/2019 0738   LYMPHSABS 1.0 10/14/2019 0738   MONOABS 0.2 10/14/2019 0738   EOSABS 0.0 10/14/2019 0738   BASOSABS 0.1 10/14/2019 0738    CMP     Component Value Date/Time   NA 138 05/27/2019 0857   K 4.2 05/27/2019 0857   CL 103 05/27/2019 0857   CO2 26 05/27/2019 0857   GLUCOSE 175 (H) 05/27/2019 0857   BUN 23 05/27/2019 0857   CREATININE 1.16 (H) 05/27/2019 0857   CALCIUM 9.0 05/27/2019 0857   PROT 8.2 (H) 05/27/2019 0857   ALBUMIN 3.9 05/27/2019 0857   AST 16 05/27/2019 0857   ALT 10 05/27/2019 0857   ALKPHOS 56 05/27/2019 0857   BILITOT 0.8 05/27/2019 0857   GFRNONAA 44 (L) 05/27/2019 0857   GFRAA 51 (L) 05/27/2019 0857     Assessment and Plan: 1. Neutropenia, unspecified type ([place])   2. Macrocytic anemia   3. Anemia of chronic disease     #Bone marrow biopsy report was reviewed and discussed with patient.  Material is extremely limited and is considered a suboptimal  for accurate diagnosis or further evaluation.  Repeat biopsy is recommended. Peripheral blood smear displayed mild anisocytosis, macrocytic and normocytic cells.  Poikilocytosis with elliptocytes, teardrop cells, target cells and scattered schistocytes. Mild polychromasia.  White blood cell is decreased. Bone marrow aspirate showed most hemodiluted with a few very small and a markedly hypocellular particles. Bone marrow clot and biopsy showed no bone marrow particles. 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 Discussed with patient that although bone marrow biopsy material is suboptimal,  cytogenetics show chromosome deletion that can be observed in MDS. Discussed with patient about repeat bone marrow biopsy and patient prefers to wait a few more months. Given the global COVID-19 pandemic, Relative stable counts, no peripheral blast, I think is reasonable to close monitor and repeat a bone marrow biopsy in the next 3 to 6 months.  Patient and son agree with plan.  Follow Up Instructions: 3 months   I discussed the assessment and treatment plan with the patient. The patient was provided an opportunity to ask questions and all were answered. The patient agreed with the plan and demonstrated an understanding of the instructions.  The patient  was advised to call back or seek an in-person evaluation if the symptoms worsen or if the condition fails to improve as anticipated.  Earlie Server, MD 10/30/2019 9:12 PM

## 2020-01-23 ENCOUNTER — Inpatient Hospital Stay: Payer: Medicare HMO | Attending: Oncology

## 2020-01-23 DIAGNOSIS — D72819 Decreased white blood cell count, unspecified: Secondary | ICD-10-CM | POA: Diagnosis not present

## 2020-01-23 DIAGNOSIS — D539 Nutritional anemia, unspecified: Secondary | ICD-10-CM | POA: Insufficient documentation

## 2020-01-23 LAB — CBC WITH DIFFERENTIAL/PLATELET
Abs Immature Granulocytes: 0 10*3/uL (ref 0.00–0.07)
Basophils Absolute: 0.1 10*3/uL (ref 0.0–0.1)
Basophils Relative: 4 %
Eosinophils Absolute: 0.1 10*3/uL (ref 0.0–0.5)
Eosinophils Relative: 3 %
HCT: 25.4 % — ABNORMAL LOW (ref 36.0–46.0)
Hemoglobin: 8.2 g/dL — ABNORMAL LOW (ref 12.0–15.0)
Immature Granulocytes: 0 %
Lymphocytes Relative: 50 %
Lymphs Abs: 1.1 10*3/uL (ref 0.7–4.0)
MCH: 33.2 pg (ref 26.0–34.0)
MCHC: 32.3 g/dL (ref 30.0–36.0)
MCV: 102.8 fL — ABNORMAL HIGH (ref 80.0–100.0)
Monocytes Absolute: 0.2 10*3/uL (ref 0.1–1.0)
Monocytes Relative: 9 %
Neutro Abs: 0.8 10*3/uL — ABNORMAL LOW (ref 1.7–7.7)
Neutrophils Relative %: 34 %
Platelets: 245 10*3/uL (ref 150–400)
RBC: 2.47 MIL/uL — ABNORMAL LOW (ref 3.87–5.11)
RDW: 15.5 % (ref 11.5–15.5)
WBC: 2.2 10*3/uL — ABNORMAL LOW (ref 4.0–10.5)
nRBC: 0 % (ref 0.0–0.2)

## 2020-01-23 LAB — COMPREHENSIVE METABOLIC PANEL
ALT: 8 U/L (ref 0–44)
AST: 15 U/L (ref 15–41)
Albumin: 4.2 g/dL (ref 3.5–5.0)
Alkaline Phosphatase: 49 U/L (ref 38–126)
Anion gap: 8 (ref 5–15)
BUN: 15 mg/dL (ref 8–23)
CO2: 25 mmol/L (ref 22–32)
Calcium: 9 mg/dL (ref 8.9–10.3)
Chloride: 105 mmol/L (ref 98–111)
Creatinine, Ser: 0.99 mg/dL (ref 0.44–1.00)
GFR calc Af Amer: >60 mL/min (ref >60)
GFR calc non Af Amer: 53 mL/min — ABNORMAL LOW (ref >60)
Glucose, Bld: 130 mg/dL — ABNORMAL HIGH (ref 70–99)
Potassium: 4.1 mmol/L (ref 3.5–5.1)
Sodium: 138 mmol/L (ref 135–145)
Total Bilirubin: 0.7 mg/dL (ref 0.3–1.2)
Total Protein: 8.1 g/dL (ref 6.5–8.1)

## 2020-01-26 ENCOUNTER — Inpatient Hospital Stay (HOSPITAL_BASED_OUTPATIENT_CLINIC_OR_DEPARTMENT_OTHER): Payer: Medicare HMO | Admitting: Oncology

## 2020-01-26 ENCOUNTER — Other Ambulatory Visit: Payer: Medicare HMO

## 2020-01-26 ENCOUNTER — Encounter: Payer: Self-pay | Admitting: Oncology

## 2020-01-26 DIAGNOSIS — D709 Neutropenia, unspecified: Secondary | ICD-10-CM

## 2020-01-26 DIAGNOSIS — D539 Nutritional anemia, unspecified: Secondary | ICD-10-CM | POA: Diagnosis not present

## 2020-01-26 NOTE — Progress Notes (Signed)
Patient verified using two identifiers for virtual visit via telephone today.  Patient does not offer any problems today.  

## 2020-01-26 NOTE — Progress Notes (Signed)
HEMATOLOGY-ONCOLOGY TeleHEALTH VISIT PROGRESS NOTE  I connected with Martha Butler on 01/26/20 at  2:00 PM EDT by video enabled telemedicine visit and verified that I am speaking with the correct person using two identifiers. I discussed the limitations, risks, security and privacy concerns of performing an evaluation and management service by telemedicine and the availability of in-person appointments. I also discussed with the patient that there may be a patient responsible charge related to this service. The patient expressed understanding and agreed to proceed.   Other persons participating in the visit and their role in the encounter:  Son who helps to set up virtual visit.  Patient's location: Home  Provider's location: office Chief Complaint: leukopenia and anemia   INTERVAL HISTORY Martha Butler is a 85 y.o. female who has above history reviewed by me today presents for follow up visit for management of leukopenia and anemia.  Problems and complaints are listed below:  Patient has no new complaints.   Review of Systems  Constitutional: Positive for fatigue. Negative for appetite change, chills and fever.  HENT:   Negative for hearing loss and voice change.   Eyes: Negative for eye problems.  Respiratory: Negative for chest tightness and cough.   Cardiovascular: Negative for chest pain.  Gastrointestinal: Negative for abdominal distention, abdominal pain and blood in stool.  Endocrine: Negative for hot flashes.  Genitourinary: Negative for difficulty urinating and frequency.   Musculoskeletal: Negative for arthralgias.  Skin: Negative for itching and rash.  Neurological: Negative for extremity weakness.  Hematological: Negative for adenopathy.  Psychiatric/Behavioral: Negative for confusion.    Past Medical History:  Diagnosis Date  . Anemia   . Depression   . GERD (gastroesophageal reflux disease)   . Hypertension    No past surgical history on file.  No family  history on file.  Social History   Socioeconomic History  . Marital status: Widowed    Spouse name: Not on file  . Number of children: 3  . Years of education: Not on file  . Highest education level: Not on file  Occupational History  . Not on file  Tobacco Use  . Smoking status: Never Smoker  . Smokeless tobacco: Never Used  Substance and Sexual Activity  . Alcohol use: No  . Drug use: No  . Sexual activity: Not on file  Other Topics Concern  . Not on file  Social History Narrative  . Not on file   Social Determinants of Health   Financial Resource Strain:   . Difficulty of Paying Living Expenses:   Food Insecurity:   . Worried About Charity fundraiser in the Last Year:   . Arboriculturist in the Last Year:   Transportation Needs:   . Film/video editor (Medical):   Marland Kitchen Lack of Transportation (Non-Medical):   Physical Activity:   . Days of Exercise per Week:   . Minutes of Exercise per Session:   Stress:   . Feeling of Stress :   Social Connections:   . Frequency of Communication with Friends and Family:   . Frequency of Social Gatherings with Friends and Family:   . Attends Religious Services:   . Active Member of Clubs or Organizations:   . Attends Archivist Meetings:   Marland Kitchen Marital Status:   Intimate Partner Violence:   . Fear of Current or Ex-Partner:   . Emotionally Abused:   Marland Kitchen Physically Abused:   . Sexually Abused:  Current Outpatient Medications on File Prior to Visit  Medication Sig Dispense Refill  . aspirin EC 81 MG tablet Take 1 tablet by mouth daily.    . Blood Pressure Monitoring (BLOOD PRESSURE KIT) KIT     . docusate sodium (COLACE) 100 MG capsule Take 1 capsule (100 mg total) by mouth daily. Do not take if you have loose stools 90 capsule 1  . escitalopram (LEXAPRO) 5 MG tablet Take 5 mg by mouth daily.    . ferrous sulfate 325 (65 FE) MG tablet Take 1 tablet (325 mg total) by mouth daily with breakfast. 60 tablet 3  . losartan  (COZAAR) 50 MG tablet 50 mg daily.    Marland Kitchen omeprazole (PRILOSEC) 20 MG capsule Take 1 capsule by mouth daily.     No current facility-administered medications on file prior to visit.    No Known Allergies     Observations/Objective: Today's Vitals   01/26/20 1429  PainSc: 0-No pain   There is no height or weight on file to calculate BMI.  Physical Exam  Constitutional: No distress.  Neurological: She is alert.    CBC    Component Value Date/Time   WBC 2.2 (L) 01/23/2020 1320   RBC 2.47 (L) 01/23/2020 1320   HGB 8.2 (L) 01/23/2020 1320   HCT 25.4 (L) 01/23/2020 1320   PLT 245 01/23/2020 1320   MCV 102.8 (H) 01/23/2020 1320   MCH 33.2 01/23/2020 1320   MCHC 32.3 01/23/2020 1320   RDW 15.5 01/23/2020 1320   LYMPHSABS 1.1 01/23/2020 1320   MONOABS 0.2 01/23/2020 1320   EOSABS 0.1 01/23/2020 1320   BASOSABS 0.1 01/23/2020 1320    CMP     Component Value Date/Time   NA 138 01/23/2020 1320   K 4.1 01/23/2020 1320   CL 105 01/23/2020 1320   CO2 25 01/23/2020 1320   GLUCOSE 130 (H) 01/23/2020 1320   BUN 15 01/23/2020 1320   CREATININE 0.99 01/23/2020 1320   CALCIUM 9.0 01/23/2020 1320   PROT 8.1 01/23/2020 1320   ALBUMIN 4.2 01/23/2020 1320   AST 15 01/23/2020 1320   ALT 8 01/23/2020 1320   ALKPHOS 49 01/23/2020 1320   BILITOT 0.7 01/23/2020 1320   GFRNONAA 53 (L) 01/23/2020 1320   GFRAA >60 01/23/2020 1320     Assessment and Plan: 1. Macrocytic anemia   2. Neutropenia, unspecified type [facility])     Labs are reviewed and discussed with patient. Macrocytic anemia, hemoglobin further decreased to 8.2. 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  I recommend repeat biopsy and she wanted to defer at last visit. Today she agrees with the plan.  Will obtain repeat bone marrow biopsy Neutropenia, asymptomatic. Monitor.   Follow Up  Instructions: Follow up after bone marrow bx   I discussed the assessment and treatment plan with the patient. The patient was provided an opportunity to ask questions and all were answered. The patient agreed with the plan and demonstrated an understanding of the instructions.  The patient was advised to call back or seek an in-person evaluation if the symptoms worsen or if the condition fails to improve as anticipated.     Earlie Server, MD 01/26/2020 9:55 PM

## 2020-01-27 ENCOUNTER — Telehealth: Payer: Self-pay

## 2020-01-27 NOTE — Telephone Encounter (Signed)
Patient has been notified of BM biopsy and follow up appts.

## 2020-01-27 NOTE — Telephone Encounter (Signed)
Pt scheduled for biopsy on 4/6. Please schedule her to see MD 10 days after. I will call pt once scheduled. Thanks.

## 2020-01-27 NOTE — Telephone Encounter (Signed)
Done... MD 10 days after BX Appt scheduled on 02/13/20 @ 1pm

## 2020-01-30 NOTE — Progress Notes (Signed)
Patient on schedule for BMB 02/03/2020, spoke with Terry/son regarding procedure with instructions given, aware to be NPO after MN, and be here @ 0800, and driver for home after discharge, stated understanding.

## 2020-02-02 ENCOUNTER — Other Ambulatory Visit: Payer: Self-pay | Admitting: Physician Assistant

## 2020-02-03 ENCOUNTER — Other Ambulatory Visit: Payer: Self-pay

## 2020-02-03 ENCOUNTER — Ambulatory Visit
Admission: RE | Admit: 2020-02-03 | Discharge: 2020-02-03 | Disposition: A | Discharge status: Home / Self Care | Payer: Medicare HMO | Source: Ambulatory Visit | Attending: Oncology | Admitting: Oncology

## 2020-02-03 DIAGNOSIS — Z79899 Other long term (current) drug therapy: Secondary | ICD-10-CM | POA: Diagnosis not present

## 2020-02-03 DIAGNOSIS — D72819 Decreased white blood cell count, unspecified: Secondary | ICD-10-CM | POA: Diagnosis not present

## 2020-02-03 DIAGNOSIS — I10 Essential (primary) hypertension: Secondary | ICD-10-CM | POA: Diagnosis not present

## 2020-02-03 DIAGNOSIS — D539 Nutritional anemia, unspecified: Secondary | ICD-10-CM | POA: Insufficient documentation

## 2020-02-03 DIAGNOSIS — Z7982 Long term (current) use of aspirin: Secondary | ICD-10-CM | POA: Diagnosis not present

## 2020-02-03 DIAGNOSIS — D709 Neutropenia, unspecified: Secondary | ICD-10-CM | POA: Diagnosis not present

## 2020-02-03 DIAGNOSIS — F329 Major depressive disorder, single episode, unspecified: Secondary | ICD-10-CM | POA: Insufficient documentation

## 2020-02-03 DIAGNOSIS — K219 Gastro-esophageal reflux disease without esophagitis: Secondary | ICD-10-CM | POA: Diagnosis not present

## 2020-02-03 LAB — CBC WITH DIFFERENTIAL/PLATELET
Abs Immature Granulocytes: 0 10*3/uL (ref 0.00–0.07)
Basophils Absolute: 0.1 10*3/uL (ref 0.0–0.1)
Basophils Relative: 3 %
Eosinophils Absolute: 0.1 10*3/uL (ref 0.0–0.5)
Eosinophils Relative: 2 %
HCT: 26.6 % — ABNORMAL LOW (ref 36.0–46.0)
Hemoglobin: 8.4 g/dL — ABNORMAL LOW (ref 12.0–15.0)
Immature Granulocytes: 0 %
Lymphocytes Relative: 48 %
Lymphs Abs: 1 10*3/uL (ref 0.7–4.0)
MCH: 32.8 pg (ref 26.0–34.0)
MCHC: 31.6 g/dL (ref 30.0–36.0)
MCV: 103.9 fL — ABNORMAL HIGH (ref 80.0–100.0)
Monocytes Absolute: 0.2 10*3/uL (ref 0.1–1.0)
Monocytes Relative: 9 %
Neutro Abs: 0.8 10*3/uL — ABNORMAL LOW (ref 1.7–7.7)
Neutrophils Relative %: 38 %
Platelets: 263 10*3/uL (ref 150–400)
RBC: 2.56 MIL/uL — ABNORMAL LOW (ref 3.87–5.11)
RDW: 15.8 % — ABNORMAL HIGH (ref 11.5–15.5)
Smear Review: NORMAL
WBC: 2.1 10*3/uL — ABNORMAL LOW (ref 4.0–10.5)
nRBC: 0 % (ref 0.0–0.2)

## 2020-02-03 MED ORDER — SODIUM CHLORIDE 0.9 % IV SOLN: INTRAVENOUS | Status: DC

## 2020-02-03 MED ORDER — FENTANYL CITRATE (PF) 100 MCG/2ML IJ SOLN
INTRAMUSCULAR | Status: AC
  Filled 2020-02-03: qty 2

## 2020-02-03 MED ORDER — HEPARIN SOD (PORK) LOCK FLUSH 100 UNIT/ML IV SOLN
INTRAVENOUS | Status: AC
  Filled 2020-02-03: qty 5

## 2020-02-03 MED ORDER — FENTANYL CITRATE (PF) 100 MCG/2ML IJ SOLN
INTRAMUSCULAR | Status: AC | PRN
  Administered 2020-02-03: 50 ug via INTRAVENOUS

## 2020-02-03 MED ORDER — MIDAZOLAM HCL 2 MG/2ML IJ SOLN
INTRAMUSCULAR | Status: AC | PRN
  Administered 2020-02-03: 1 mg via INTRAVENOUS

## 2020-02-03 MED ORDER — MIDAZOLAM HCL 2 MG/2ML IJ SOLN
INTRAMUSCULAR | Status: AC
  Filled 2020-02-03: qty 2

## 2020-02-03 NOTE — Progress Notes (Signed)
Patient clinically stable post BMB per Dr Miles Costain, tolerated well, vitals stable throughout, awake./alert and oriented post procedure. Received Versed 1mg  along with Fentanyl IV for procedure.

## 2020-02-03 NOTE — H&P (Signed)
Chief Complaint:   pancytopenia  Referring Physician(s): Yu,Zhou    History of Present Illness: Martha Butler is a 85 y.o. female with macrocytic anemia, and neutropenia.  Plan for repeat Ct BM asp and core bx.  No complaints today.  Past Medical History:  Diagnosis Date  . Anemia   . Depression   . GERD (gastroesophageal reflux disease)   . Hypertension     No past surgical history on file.  Allergies: Patient has no known allergies.  Medications: Prior to Admission medications   Medication Sig Start Date End Date Taking? Authorizing Provider  aspirin EC 81 MG tablet Take 1 tablet by mouth daily.   Yes [provider]  Blood Pressure Monitoring (BLOOD PRESSURE KIT) KIT  08/17/17  Yes [provider]  docusate sodium (COLACE) 100 MG capsule Take 1 capsule (100 mg total) by mouth daily. Do not take if you have loose stools 02/19/19  Yes Earlie Server, MD  escitalopram (LEXAPRO) 5 MG tablet Take 5 mg by mouth daily. 06/26/18  Yes [provider]  ferrous sulfate 325 (65 FE) MG tablet Take 1 tablet (325 mg total) by mouth daily with breakfast. 02/19/19  Yes Earlie Server, MD  losartan (COZAAR) 50 MG tablet 50 mg daily. 02/25/18  Yes [provider]  omeprazole (PRILOSEC) 20 MG capsule Take 1 capsule by mouth daily. 11/02/17  Yes [provider]     No family history on file.  Social History   Socioeconomic History  . Marital status: Widowed    Spouse name: Not on file  . Number of children: 3  . Years of education: Not on file  . Highest education level: Not on file  Occupational History  . Not on file  Tobacco Use  . Smoking status: Never Smoker  . Smokeless tobacco: Never Used  Substance and Sexual Activity  . Alcohol use: No  . Drug use: No  . Sexual activity: Not on file  Other Topics Concern  . Not on file  Social History Narrative  . Not on file   Social Determinants of Health   Financial Resource Strain:   .  Difficulty of Paying Living Expenses:   Food Insecurity:   . Worried About Charity fundraiser in the Last Year:   . Arboriculturist in the Last Year:   Transportation Needs:   . Film/video editor (Medical):   Marland Kitchen Lack of Transportation (Non-Medical):   Physical Activity:   . Days of Exercise per Week:   . Minutes of Exercise per Session:   Stress:   . Feeling of Stress :   Social Connections:   . Frequency of Communication with Friends and Family:   . Frequency of Social Gatherings with Friends and Family:   . Attends Religious Services:   . Active Member of Clubs or Organizations:   . Attends Archivist Meetings:   Marland Kitchen Marital Status:       Review of Systems: A 12 point ROS discussed and pertinent positives are indicated in the HPI above.  All other systems are negative.  Review of Systems  Vital Signs: BP 136/63   Pulse (!) 103   Temp 98.5 F (36.9 C) (Oral)   Resp 20   Ht _0  (1.753 m)   Wt 65.5 kg   SpO2 98%   BMI 21.31 kg/m   Physical Exam Constitutional:      General: She is not in  acute distress.    Appearance: She is not toxic-appearing.  Eyes:     General: No scleral icterus.    Conjunctiva/sclera: Conjunctivae normal.  Cardiovascular:     Rate and Rhythm: Normal rate and regular rhythm.     Comments: Systolic murmur Pulmonary:     Effort: Pulmonary effort is normal. No respiratory distress.  Abdominal:     General: There is no distension.     Tenderness: There is no abdominal tenderness.  Neurological:     Mental Status: She is alert. Mental status is at baseline.  Psychiatric:        Mood and Affect: Mood normal.     Imaging: No results found.  Labs:  CBC: Recent Labs    09/30/19 1257 10/14/19 0738 01/23/20 1320 02/03/20 0818  WBC 2.0* 2.1* 2.2* 2.1*  HGB 9.4* 9.2* 8.2* 8.4*  HCT 29.3* 27.3* 25.4* 26.6*  PLT 236 226 245 263    COAGS: Recent Labs    10/14/19 0738  INR 1.1    BMP: Recent Labs     05/27/19 0857 01/23/20 1320  NA 138 138  K 4.2 4.1  CL 103 105  CO2 26 25  GLUCOSE 175* 130*  BUN 23 15  CALCIUM 9.0 9.0  CREATININE 1.16* 0.99  GFRNONAA 44* 53*  GFRAA 51* >60    LIVER FUNCTION TESTS: Recent Labs    05/27/19 0857 01/23/20 1320  BILITOT 0.8 0.7  AST 16 15  ALT 10 8  ALKPHOS 56 49  PROT 8.2* 8.1  ALBUMIN 3.9 4.2    TUMOR MARKERS: No results for input(s): AFPTM, CEA, CA199, CHROMGRNA in the last 8760 hours.  Assessment and Plan:  Plan for Ct BM asp and core bx today.    Risks and benefits of Ct bx was discussed with the patient and/or patient's family including, but not limited to bleeding, infection, damage to adjacent structures or low yield requiring additional tests.  All of the questions were answered and there is agreement to proceed.  Consent signed and in chart.    Thank you for this interesting consult.  I greatly enjoyed meeting Martha Butler and look forward to participating in their care.  A copy of this report was sent to the requesting provider on this date.  Electronically Signed: Greggory Keen, MD 02/03/2020, 8:57 AM   I spent a total of    25 Minutes in face to face in clinical consultation, greater than 50% of which was counseling/coordinating care for this patient with pancytopenia.

## 2020-02-03 NOTE — Procedures (Signed)
Interventional Radiology Procedure Note  Procedure: CT BM ASP AND CORE BX  Complications: None  Estimated Blood Loss: MIN  Findings: 11 G ASP AND CORE

## 2020-02-03 NOTE — Discharge Instructions (Signed)
Bone Marrow Aspiration and Bone Marrow Biopsy, Adult, Care After This sheet gives you information about how to care for yourself after your procedure. Your health care provider may also give you more specific instructions. If you have problems or questions, contact your health care provider. What can I expect after the procedure? After the procedure, it is common to have:  Mild pain and tenderness.  Swelling.  Bruising. Follow these instructions at home: Puncture site care   Follow instructions from your health care provider about how to take care of the puncture site. Make sure you: ? Wash your hands with soap and water before and after you change your bandage (dressing). If soap and water are not available, use hand sanitizer. ? Change your dressing as told by your health care provider.  Check your puncture site every day for signs of infection. Check for: ? More redness, swelling, or pain. ? Fluid or blood. ? Warmth. ? Pus or a bad smell. Activity  Return to your normal activities as told by your health care provider. Ask your health care provider what activities are safe for you.  Do not lift anything that is heavier than 10 lb (4.5 kg), or the limit that you are told, until your health care provider says that it is safe.  Do not drive for 24 hours if you were given a sedative during your procedure. General instructions   Take over-the-counter and prescription medicines only as told by your health care provider.  Do not take baths, swim, or use a hot tub until your health care provider approves. Ask your health care provider if you may take showers. You may only be allowed to take sponge baths.  If directed, put ice on the affected area. To do this: ? Put ice in a plastic bag. ? Place a towel between your skin and the bag. ? Leave the ice on for 20 minutes, 2-3 times a day.  Keep all follow-up visits as told by your health care provider. This is important. Contact a  health care provider if:  Your pain is not controlled with medicine.  You have a fever.  You have more redness, swelling, or pain around the puncture site.  You have fluid or blood coming from the puncture site.  Your puncture site feels warm to the touch.  You have pus or a bad smell coming from the puncture site. Summary  After the procedure, it is common to have mild pain, tenderness, swelling, and bruising.  Follow instructions from your health care provider about how to take care of the puncture site and what activities are safe for you.  Take over-the-counter and prescription medicines only as told by your health care provider.  Contact a health care provider if you have any signs of infection, such as fluid or blood coming from the puncture site. This information is not intended to replace advice given to you by your health care provider. Make sure you discuss any questions you have with your health care provider. Document Revised: 03/04/2019 Document Reviewed: 03/04/2019 Elsevier Patient Education  2020 [employer]vier Inc[employer]  

## 2020-02-05 LAB — SURGICAL PATHOLOGY

## 2020-02-11 ENCOUNTER — Encounter (HOSPITAL_COMMUNITY): Payer: Self-pay | Admitting: Oncology

## 2020-02-13 ENCOUNTER — Inpatient Hospital Stay: Payer: Medicare HMO | Attending: Oncology | Admitting: Oncology

## 2020-02-13 ENCOUNTER — Encounter: Payer: Self-pay | Admitting: Oncology

## 2020-02-13 ENCOUNTER — Other Ambulatory Visit: Payer: Self-pay

## 2020-02-13 VITALS — BP 147/76 | HR 94 | Temp 98.0°F | Resp 16 | Wt 145.4 lb

## 2020-02-13 DIAGNOSIS — D539 Nutritional anemia, unspecified: Secondary | ICD-10-CM

## 2020-02-13 DIAGNOSIS — D709 Neutropenia, unspecified: Secondary | ICD-10-CM

## 2020-02-13 DIAGNOSIS — D638 Anemia in other chronic diseases classified elsewhere: Secondary | ICD-10-CM | POA: Diagnosis not present

## 2020-02-13 NOTE — Progress Notes (Signed)
Patient does not offer any problems today.  

## 2020-02-13 NOTE — Progress Notes (Signed)
Hematology/Oncology Follow up note [facility] [phone] [phone]   Patient Care Team: Earlie Server, MD as PCP - General (Oncology)  REFERRING PROVIDER: Earlie Server, MD and Whitaker, [place] VISIT Follow up for management of leukopenia and anemia.   HISTORY OF PRESENTING ILLNESS:  Martha Butler is a  85 y.o.  female with PMH listed below who was referred to me for evaluation of low white count and anemia. Patient reports that she is been feeling tired recently quite stressed. Her husband passed away approximately 1 month ago. She lives by herself. Doesn't have good appetite.   INTERVAL HISTORY Martha Butler is a 85 y.o. female who has above history reviewed by me today presents for follow up for management of chronic pancytopenia. Patient was accompanied by patient's son.  She underwent repeat bone marrow biopsy.  She presents to discuss results.  Chronic fatigue is getting worse . Review of Systems  Constitutional: Positive for malaise/fatigue. Negative for chills, fever and weight loss.       No appetite  HENT: Negative for sore throat.   Eyes: Negative for redness.  Respiratory: Negative for cough, shortness of breath and wheezing.   Cardiovascular: Negative for chest pain, palpitations and leg swelling.  Gastrointestinal: Negative for abdominal pain, blood in stool, nausea and vomiting.  Genitourinary: Negative for dysuria.  Musculoskeletal: Negative for myalgias.  Skin: Negative for rash.  Neurological: Negative for dizziness, tingling and tremors.  Endo/Heme/Allergies: Does not bruise/bleed easily.  Psychiatric/Behavioral: Negative for hallucinations.    MEDICAL HISTORY:  Past Medical History:  Diagnosis Date  . Anemia   . Depression   . GERD (gastroesophageal reflux disease)   . Hypertension     SURGICAL HISTORY: History reviewed. No pertinent surgical history.  SOCIAL HISTORY: Social History    Socioeconomic History  . Marital status: Widowed    Spouse name: Not on file  . Number of children: 3  . Years of education: Not on file  . Highest education level: Not on file  Occupational History  . Not on file  Tobacco Use  . Smoking status: Never Smoker  . Smokeless tobacco: Never Used  Substance and Sexual Activity  . Alcohol use: No  . Drug use: No  . Sexual activity: Not on file  Other Topics Concern  . Not on file  Social History Narrative  . Not on file   Social Determinants of Health   Financial Resource Strain:   . Difficulty of Paying Living Expenses:   Food Insecurity:   . Worried About Charity fundraiser in the Last Year:   . Arboriculturist in the Last Year:   Transportation Needs:   . Film/video editor (Medical):   Marland Kitchen Lack of Transportation (Non-Medical):   Physical Activity:   . Days of Exercise per Week:   . Minutes of Exercise per Session:   Stress:   . Feeling of Stress :   Social Connections:   . Frequency of Communication with Friends and Family:   . Frequency of Social Gatherings with Friends and Family:   . Attends Religious Services:   . Active Member of Clubs or Organizations:   . Attends Archivist Meetings:   Marland Kitchen Marital Status:   Intimate Partner Violence:   . Fear of Current or Ex-Partner:   . Emotionally Abused:   Marland Kitchen Physically Abused:   . Sexually Abused:     FAMILY HISTORY: History reviewed. No pertinent family  history.  ALLERGIES:  has No Known Allergies.  MEDICATIONS:  Current Outpatient Medications  Medication Sig Dispense Refill  . aspirin EC 81 MG tablet Take 1 tablet by mouth daily.    . Blood Pressure Monitoring (BLOOD PRESSURE KIT) KIT     . docusate sodium (COLACE) 100 MG capsule Take 1 capsule (100 mg total) by mouth daily. Do not take if you have loose stools 90 capsule 1  . escitalopram (LEXAPRO) 5 MG tablet Take 5 mg by mouth daily.    Marland Kitchen losartan (COZAAR) 50 MG tablet 50 mg daily.    Marland Kitchen  omeprazole (PRILOSEC) 20 MG capsule Take 1 capsule by mouth daily.    . ferrous sulfate 325 (65 FE) MG tablet Take 1 tablet (325 mg total) by mouth daily with breakfast. 60 tablet 3   No current facility-administered medications for this visit.     PHYSICAL EXAMINATION: ECOG PERFORMANCE STATUS: 1 - Symptomatic but completely ambulatory Vitals:   02/13/20 1505  BP: (!) 147/76  Pulse: 94  Resp: 16  Temp: 98 F (36.7 C)  SpO2: 100%   Filed Weights   02/13/20 1505  Weight: 145 lb 6.4 oz (66 kg)    Physical Exam  Constitutional: She is oriented to person, place, and time and well-developed, well-nourished, and in no distress. No distress.  HENT:  Head: Normocephalic and atraumatic.  Nose: Nose normal.  Mouth/Throat: Oropharynx is clear and moist. No oropharyngeal exudate.  Eyes: Pupils are equal, round, and reactive to light. EOM are normal. Right eye exhibits no discharge. Left eye exhibits no discharge. No scleral icterus.  Neck: No JVD present.  Cardiovascular: Normal rate and regular rhythm.  Murmur heard. Systolic murmur  Pulmonary/Chest: Effort normal and breath sounds normal. No respiratory distress. She has no wheezes. She has no rales. She exhibits no tenderness.  Abdominal: Soft. Bowel sounds are normal. She exhibits no distension and no mass. There is no abdominal tenderness. There is no rebound.  Musculoskeletal:        General: No tenderness or edema. Normal range of motion.     Cervical back: Normal range of motion and neck supple.  Lymphadenopathy:    She has no cervical adenopathy.  Neurological: She is alert and oriented to person, place, and time. No cranial nerve deficit. She exhibits normal muscle tone. Coordination normal.  Skin: Skin is warm and dry. No rash noted. She is not diaphoretic. No erythema.  Psychiatric: Affect and judgment normal.     LABORATORY DATA:  I have reviewed the data as listed Lab Results  Component Value Date   WBC 2.1 (L)  02/03/2020   HGB 8.4 (L) 02/03/2020   HCT 26.6 (L) 02/03/2020   MCV 103.9 (H) 02/03/2020   PLT 263 02/03/2020   Recent Labs    05/27/19 0857 01/23/20 1320  NA 138 138  K 4.2 4.1  CL 103 105  CO2 26 25  GLUCOSE 175* 130*  BUN 23 15  CREATININE 1.16* 0.99  CALCIUM 9.0 9.0  GFRNONAA 44* 53*  GFRAA 51* >60  PROT 8.2* 8.1  ALBUMIN 3.9 4.2  AST 16 15  ALT 10 8  ALKPHOS 56 49  BILITOT 0.8 0.7    Normal vitamin B12, Folate, iron/TIBC, ferritin, TSH normal. No monoclonal protein. Normal LDH, TSH.   ASSESSMENT & PLAN:  1. Macrocytic anemia   2. Neutropenia, unspecified type ([place])   3. Anemia of chronic disease    #Leukopenia, predominantly neutropenia.  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 no apparent  lymphoproliferative process or plasma cell neoplasm.  Cytogenetic showed no metaphase cell.   I discussed with patient and her son that although MDS is possible, both bone marrow biopsy did not give definitive diagnosis.  Patient and her son prefer not to have additional work up at this point and be monitored.   Follow up in 6 months   Earlie Server, MD, PhD Hematology Oncology [place] [facility] 02/13/2020

## 2020-08-17 ENCOUNTER — Inpatient Hospital Stay (HOSPITAL_BASED_OUTPATIENT_CLINIC_OR_DEPARTMENT_OTHER): Payer: Medicare HMO | Admitting: Oncology

## 2020-08-17 ENCOUNTER — Inpatient Hospital Stay: Payer: Medicare HMO

## 2020-08-17 ENCOUNTER — Inpatient Hospital Stay: Payer: Medicare HMO | Attending: Oncology

## 2020-08-17 ENCOUNTER — Other Ambulatory Visit: Payer: Self-pay

## 2020-08-17 VITALS — BP 117/77 | HR 102 | Temp 98.4°F | Resp 16 | Wt 137.4 lb

## 2020-08-17 DIAGNOSIS — D638 Anemia in other chronic diseases classified elsewhere: Secondary | ICD-10-CM

## 2020-08-17 DIAGNOSIS — Z23 Encounter for immunization: Secondary | ICD-10-CM | POA: Insufficient documentation

## 2020-08-17 DIAGNOSIS — D539 Nutritional anemia, unspecified: Secondary | ICD-10-CM

## 2020-08-17 DIAGNOSIS — D709 Neutropenia, unspecified: Secondary | ICD-10-CM

## 2020-08-17 DIAGNOSIS — R634 Abnormal weight loss: Secondary | ICD-10-CM | POA: Insufficient documentation

## 2020-08-17 DIAGNOSIS — D7589 Other specified diseases of blood and blood-forming organs: Secondary | ICD-10-CM | POA: Insufficient documentation

## 2020-08-17 LAB — COMPREHENSIVE METABOLIC PANEL WITH GFR
ALT: 8 U/L (ref 0–44)
AST: 13 U/L — ABNORMAL LOW (ref 15–41)
Albumin: 3.8 g/dL (ref 3.5–5.0)
Alkaline Phosphatase: 40 U/L (ref 38–126)
Anion gap: 8 (ref 5–15)
BUN: 15 mg/dL (ref 8–23)
CO2: 27 mmol/L (ref 22–32)
Calcium: 8.7 mg/dL — ABNORMAL LOW (ref 8.9–10.3)
Chloride: 104 mmol/L (ref 98–111)
Creatinine, Ser: 0.79 mg/dL (ref 0.44–1.00)
GFR, Estimated: >60 mL/min
Glucose, Bld: 126 mg/dL — ABNORMAL HIGH (ref 70–99)
Potassium: 4.1 mmol/L (ref 3.5–5.1)
Sodium: 139 mmol/L (ref 135–145)
Total Bilirubin: 0.8 mg/dL (ref 0.3–1.2)
Total Protein: 7.6 g/dL (ref 6.5–8.1)

## 2020-08-17 LAB — FERRITIN: Ferritin: 126 ng/mL (ref 11–307)

## 2020-08-17 LAB — CBC WITH DIFFERENTIAL/PLATELET
Abs Immature Granulocytes: 0 10*3/uL (ref 0.00–0.07)
Basophils Absolute: 0.1 10*3/uL (ref 0.0–0.1)
Basophils Relative: 2 %
Eosinophils Absolute: 0.1 10*3/uL (ref 0.0–0.5)
Eosinophils Relative: 2 %
HCT: 22.7 % — ABNORMAL LOW (ref 36.0–46.0)
Hemoglobin: 7.4 g/dL — ABNORMAL LOW (ref 12.0–15.0)
Immature Granulocytes: 0 %
Lymphocytes Relative: 57 %
Lymphs Abs: 1.2 10*3/uL (ref 0.7–4.0)
MCH: 33.3 pg (ref 26.0–34.0)
MCHC: 32.6 g/dL (ref 30.0–36.0)
MCV: 102.3 fL — ABNORMAL HIGH (ref 80.0–100.0)
Monocytes Absolute: 0.2 10*3/uL (ref 0.1–1.0)
Monocytes Relative: 8 %
Neutro Abs: 0.7 10*3/uL — ABNORMAL LOW (ref 1.7–7.7)
Neutrophils Relative %: 31 %
Platelets: 232 10*3/uL (ref 150–400)
RBC: 2.22 MIL/uL — ABNORMAL LOW (ref 3.87–5.11)
RDW: 16.3 % — ABNORMAL HIGH (ref 11.5–15.5)
WBC: 2.1 10*3/uL — ABNORMAL LOW (ref 4.0–10.5)
nRBC: 0 % (ref 0.0–0.2)

## 2020-08-17 LAB — IRON AND TIBC
Iron: 86 ug/dL (ref 28–170)
Saturation Ratios: 34 % — ABNORMAL HIGH (ref 10.4–31.8)
TIBC: 251 ug/dL (ref 250–450)
UIBC: 165 ug/dL

## 2020-08-17 LAB — RETIC PANEL
Immature Retic Fract: 21.8 % — ABNORMAL HIGH (ref 2.3–15.9)
RBC.: 2.22 MIL/uL — ABNORMAL LOW (ref 3.87–5.11)
Retic Count, Absolute: 36.2 K/uL (ref 19.0–186.0)
Retic Ct Pct: 1.6 % (ref 0.4–3.1)
Reticulocyte Hemoglobin: 29.9 pg

## 2020-08-17 LAB — LACTATE DEHYDROGENASE: LDH: 123 U/L (ref 98–192)

## 2020-08-17 LAB — TECHNOLOGIST SMEAR REVIEW: Plt Morphology: ADEQUATE

## 2020-08-17 MED ORDER — INFLUENZA VAC A&B SA ADJ QUAD 0.5 ML IM PRSY
0.5000 mL | PREFILLED_SYRINGE | Freq: Once | INTRAMUSCULAR | Status: AC
  Administered 2020-08-17: 0.5 mL via INTRAMUSCULAR
  Filled 2020-08-17: qty 0.5

## 2020-08-17 NOTE — Progress Notes (Signed)
Hematology/Oncology Follow up note [facility] Telephone:([phone] Fax:(336) [phone]   Patient Care Team: Earlie Server, MD as PCP - General (Oncology)  REFERRING PROVIDER: Earlie Server, MD and Whitaker, [place] VISIT Follow up for management of leukopenia and anemia.   HISTORY OF PRESENTING ILLNESS:  Martha Butler is a  85 y.o.  female with PMH listed below who was referred to me for evaluation of low white count and anemia. Patient reports that she is been feeling tired recently quite stressed. Her husband passed away approximately 1 month ago. She lives by herself. Doesn't have good appetite.   INTERVAL HISTORY Martha Butler is a 85 y.o. female who has above history reviewed by me today presents for follow up for management of chronic pancytopenia. Patient was accompanied by patient's son.  Patient reports feeling tired and fatigued.  Otherwise no new complaints. She has lost 8 pounds of weight. . Review of Systems  Constitutional: Positive for malaise/fatigue. Negative for chills, fever and weight loss.       No appetite  HENT: Negative for sore throat.   Eyes: Negative for redness.  Respiratory: Negative for cough, shortness of breath and wheezing.   Cardiovascular: Negative for chest pain, palpitations and leg swelling.  Gastrointestinal: Negative for abdominal pain, blood in stool, nausea and vomiting.  Genitourinary: Negative for dysuria.  Musculoskeletal: Negative for myalgias.  Skin: Negative for rash.  Neurological: Negative for dizziness, tingling and tremors.  Endo/Heme/Allergies: Does not bruise/bleed easily.  Psychiatric/Behavioral: Negative for hallucinations.    MEDICAL HISTORY:  Past Medical History:  Diagnosis Date  . Anemia   . Depression   . GERD (gastroesophageal reflux disease)   . Hypertension     SURGICAL HISTORY: No past surgical history on file.  SOCIAL HISTORY: Social History   Socioeconomic History  .  Marital status: Widowed    Spouse name: Not on file  . Number of children: 3  . Years of education: Not on file  . Highest education level: Not on file  Occupational History  . Not on file  Tobacco Use  . Smoking status: Never Smoker  . Smokeless tobacco: Never Used  Substance and Sexual Activity  . Alcohol use: No  . Drug use: No  . Sexual activity: Not on file  Other Topics Concern  . Not on file  Social History Narrative  . Not on file   Social Determinants of Health   Financial Resource Strain:   . Difficulty of Paying Living Expenses: Not on file  Food Insecurity:   . Worried About Charity fundraiser in the Last Year: Not on file  . Ran Out of Food in the Last Year: Not on file  Transportation Needs:   . Lack of Transportation (Medical): Not on file  . Lack of Transportation (Non-Medical): Not on file  Physical Activity:   . Days of Exercise per Week: Not on file  . Minutes of Exercise per Session: Not on file  Stress:   . Feeling of Stress : Not on file  Social Connections:   . Frequency of Communication with Friends and Family: Not on file  . Frequency of Social Gatherings with Friends and Family: Not on file  . Attends Religious Services: Not on file  . Active Member of Clubs or Organizations: Not on file  . Attends Archivist Meetings: Not on file  . Marital Status: Not on file  Intimate Partner Violence:   . Fear of Current  or Ex-Partner: Not on file  . Emotionally Abused: Not on file  . Physically Abused: Not on file  . Sexually Abused: Not on file    FAMILY HISTORY: No family history on file.  ALLERGIES:  has No Known Allergies.  MEDICATIONS:  Current Outpatient Medications  Medication Sig Dispense Refill  . aspirin EC 81 MG tablet Take 1 tablet by mouth daily.    . Blood Pressure Monitoring (BLOOD PRESSURE KIT) KIT     . docusate sodium (COLACE) 100 MG capsule Take 1 capsule (100 mg total) by mouth daily. Do not take if you have loose  stools 90 capsule 1  . escitalopram (LEXAPRO) 5 MG tablet Take 5 mg by mouth daily.    . ferrous sulfate 325 (65 FE) MG tablet Take 1 tablet (325 mg total) by mouth daily with breakfast. 60 tablet 3  . losartan (COZAAR) 50 MG tablet 50 mg daily.    Marland Kitchen omeprazole (PRILOSEC) 20 MG capsule Take 1 capsule by mouth daily.     No current facility-administered medications for this visit.     PHYSICAL EXAMINATION: ECOG PERFORMANCE STATUS: 1 - Symptomatic but completely ambulatory Vitals:   08/17/20 1341  BP: 117/77  Pulse: (!) 102  Resp: 16  Temp: 98.4 F (36.9 C)   Filed Weights   08/17/20 1341  Weight: 137 lb 6.4 oz (62.3 kg)    Physical Exam Constitutional:      General: She is not in acute distress.    Appearance: She is not diaphoretic.  HENT:     Head: Normocephalic and atraumatic.     Nose: Nose normal.     Mouth/Throat:     Pharynx: No oropharyngeal exudate.  Eyes:     General: No scleral icterus.       Right eye: No discharge.        Left eye: No discharge.     Pupils: Pupils are equal, round, and reactive to light.  Neck:     Vascular: No JVD.  Cardiovascular:     Rate and Rhythm: Normal rate and regular rhythm.     Heart sounds: Murmur heard.      Comments: Systolic murmur Pulmonary:     Effort: Pulmonary effort is normal. No respiratory distress.     Breath sounds: Normal breath sounds. No wheezing or rales.  Chest:     Chest wall: No tenderness.  Abdominal:     General: Bowel sounds are normal. There is no distension.     Palpations: Abdomen is soft. There is no mass.     Tenderness: There is no abdominal tenderness. There is no rebound.  Musculoskeletal:        General: No tenderness. Normal range of motion.     Cervical back: Normal range of motion and neck supple.  Lymphadenopathy:     Cervical: No cervical adenopathy.  Skin:    General: Skin is warm and dry.     Findings: No erythema or rash.  Neurological:     Mental Status: She is alert and  oriented to person, place, and time.     Cranial Nerves: No cranial nerve deficit.     Motor: No abnormal muscle tone.     Coordination: Coordination normal.  Psychiatric:        Mood and Affect: Affect normal.        Judgment: Judgment normal.      LABORATORY DATA:  I have reviewed the data as listed Lab Results  Component Value  Date   WBC 2.1 (L) 08/17/2020   HGB 7.4 (L) 08/17/2020   HCT 22.7 (L) 08/17/2020   MCV 102.3 (H) 08/17/2020   PLT 232 08/17/2020   Recent Labs    01/23/20 1320 08/17/20 1320  NA 138 139  K 4.1 4.1  CL 105 104  CO2 25 27  GLUCOSE 130* 126*  BUN 15 15  CREATININE 0.99 0.79  CALCIUM 9.0 8.7*  GFRNONAA 53* >60  GFRAA >60  --   PROT 8.1 7.6  ALBUMIN 4.2 3.8  AST 15 13*  ALT 8 8  ALKPHOS 49 40  BILITOT 0.7 0.8    Normal vitamin B12, Folate, iron/TIBC, ferritin, TSH normal. No monoclonal protein. Normal LDH, TSH.   ASSESSMENT & PLAN:  1. Macrocytosis   2. Unintentional weight loss   3. Neutropenia, unspecified type ([place])   4. Anemia of chronic disease    #Leukopenia, predominantly neutropenia.  ANC 0.7 #Anemia, progressively worsening of hemoglobin of 7.4. Iron panel is not consistent with iron deficiency.   I suspect that she has underlying MDS.  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 02/03/2020, repeat biopsy has very limited material is considered suboptimal for evaluation. Patient does not want to get any additional bone marrow biopsy done.  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 Patient is not interested in aggressive diagnostic testing and images.  I respect patient's wish.  Recommend supportive care. Refer to palliative care. Weight loss due to decreased oral intake. Refer to nutritionist.   Patient will follow up in  4 weeks, or tubes possible blood transfusion. Patient and son agree with the plan. Recommend flu vaccination.  Patient and son agree with the plan.  Patient will receive flu vaccination today. Follow up in 6 months   Earlie Server, MD, PhD Hematology Oncology [place] at [facility] 08/17/2020

## 2020-08-17 NOTE — Progress Notes (Signed)
Patient denies new problems/concerns today.   °

## 2020-08-18 ENCOUNTER — Telehealth: Payer: Self-pay

## 2020-08-18 DIAGNOSIS — R634 Abnormal weight loss: Secondary | ICD-10-CM

## 2020-08-18 DIAGNOSIS — D709 Neutropenia, unspecified: Secondary | ICD-10-CM

## 2020-08-18 DIAGNOSIS — D638 Anemia in other chronic diseases classified elsewhere: Secondary | ICD-10-CM

## 2020-08-18 NOTE — Telephone Encounter (Signed)
-----   Message from Rickard Patience, MD sent at 08/17/2020  7:59 PM EDT ----- Her plan is to do possible blood transfusion, not iron. Please change Thanks  Also please refer her to establish care with palliative care service and nutritionist. Thanks. Marland Kitchen

## 2020-08-18 NOTE — Telephone Encounter (Signed)
Please change appt in November from venofer to possible blood. Schedule appt with Palliative and nutrition. Please call pt with appts. Thanks

## 2020-08-18 NOTE — Telephone Encounter (Signed)
error 

## 2020-08-18 NOTE — Telephone Encounter (Signed)
Done.. Appt has been updated as requested. A detailed message was left on pt son's [place] VM making him aware of her scheduled appts. An updated NEW reminder letter will be mailed out.

## 2020-09-13 ENCOUNTER — Encounter: Payer: Self-pay | Admitting: Hospice and Palliative Medicine

## 2020-09-13 ENCOUNTER — Inpatient Hospital Stay (HOSPITAL_BASED_OUTPATIENT_CLINIC_OR_DEPARTMENT_OTHER): Payer: Medicare HMO | Admitting: Hospice and Palliative Medicine

## 2020-09-13 ENCOUNTER — Inpatient Hospital Stay: Payer: Medicare HMO | Attending: Oncology

## 2020-09-13 VITALS — BP 139/77 | HR 103 | Temp 97.9°F | Resp 16 | Wt 135.1 lb

## 2020-09-13 DIAGNOSIS — K219 Gastro-esophageal reflux disease without esophagitis: Secondary | ICD-10-CM | POA: Diagnosis not present

## 2020-09-13 DIAGNOSIS — D649 Anemia, unspecified: Secondary | ICD-10-CM | POA: Insufficient documentation

## 2020-09-13 DIAGNOSIS — D72819 Decreased white blood cell count, unspecified: Secondary | ICD-10-CM | POA: Insufficient documentation

## 2020-09-13 DIAGNOSIS — F329 Major depressive disorder, single episode, unspecified: Secondary | ICD-10-CM | POA: Diagnosis not present

## 2020-09-13 DIAGNOSIS — I10 Essential (primary) hypertension: Secondary | ICD-10-CM | POA: Insufficient documentation

## 2020-09-13 DIAGNOSIS — D7589 Other specified diseases of blood and blood-forming organs: Secondary | ICD-10-CM

## 2020-09-13 DIAGNOSIS — Z515 Encounter for palliative care: Secondary | ICD-10-CM | POA: Diagnosis not present

## 2020-09-13 DIAGNOSIS — D709 Neutropenia, unspecified: Secondary | ICD-10-CM | POA: Diagnosis not present

## 2020-09-13 DIAGNOSIS — R634 Abnormal weight loss: Secondary | ICD-10-CM | POA: Diagnosis not present

## 2020-09-13 LAB — COMPREHENSIVE METABOLIC PANEL
ALT: 8 U/L (ref 0–44)
AST: 11 U/L — ABNORMAL LOW (ref 15–41)
Albumin: 4.3 g/dL (ref 3.5–5.0)
Alkaline Phosphatase: 41 U/L (ref 38–126)
Anion gap: 6 (ref 5–15)
BUN: 14 mg/dL (ref 8–23)
CO2: 29 mmol/L (ref 22–32)
Calcium: 9 mg/dL (ref 8.9–10.3)
Chloride: 103 mmol/L (ref 98–111)
Creatinine, Ser: 1.04 mg/dL — ABNORMAL HIGH (ref 0.44–1.00)
GFR, Estimated: 53 mL/min — ABNORMAL LOW (ref >60)
Glucose, Bld: 157 mg/dL — ABNORMAL HIGH (ref 70–99)
Potassium: 3.9 mmol/L (ref 3.5–5.1)
Sodium: 138 mmol/L (ref 135–145)
Total Bilirubin: 0.7 mg/dL (ref 0.3–1.2)
Total Protein: 8.4 g/dL — ABNORMAL HIGH (ref 6.5–8.1)

## 2020-09-13 LAB — CBC WITH DIFFERENTIAL/PLATELET
Abs Immature Granulocytes: 0 10*3/uL (ref 0.00–0.07)
Basophils Absolute: 0.1 10*3/uL (ref 0.0–0.1)
Basophils Relative: 4 %
Eosinophils Absolute: 0.1 10*3/uL (ref 0.0–0.5)
Eosinophils Relative: 3 %
HCT: 23.4 % — ABNORMAL LOW (ref 36.0–46.0)
Hemoglobin: 7.6 g/dL — ABNORMAL LOW (ref 12.0–15.0)
Immature Granulocytes: 0 %
Lymphocytes Relative: 58 %
Lymphs Abs: 1.3 10*3/uL (ref 0.7–4.0)
MCH: 33.5 pg (ref 26.0–34.0)
MCHC: 32.5 g/dL (ref 30.0–36.0)
MCV: 103.1 fL — ABNORMAL HIGH (ref 80.0–100.0)
Monocytes Absolute: 0.2 10*3/uL (ref 0.1–1.0)
Monocytes Relative: 8 %
Neutro Abs: 0.6 10*3/uL — ABNORMAL LOW (ref 1.7–7.7)
Neutrophils Relative %: 27 %
Platelets: 275 10*3/uL (ref 150–400)
RBC: 2.27 MIL/uL — ABNORMAL LOW (ref 3.87–5.11)
RDW: 16.7 % — ABNORMAL HIGH (ref 11.5–15.5)
WBC: 2.2 10*3/uL — ABNORMAL LOW (ref 4.0–10.5)
nRBC: 0 % (ref 0.0–0.2)

## 2020-09-13 LAB — SAMPLE TO BLOOD BANK

## 2020-09-13 LAB — VITAMIN B12: Vitamin B-12: 1043 pg/mL — ABNORMAL HIGH (ref 180–914)

## 2020-09-13 LAB — FOLATE: Folate: 64.3 ng/mL (ref >5.9)

## 2020-09-13 NOTE — Progress Notes (Signed)
[place]  Telephone:([phone] [phone]) [phone]   Name: Martha Butler Date: 09/13/2020 MRN: [id number]  DOB: Dec 19, 1934  Patient Care Team: Earlie Server, MD as PCP - General (Oncology)    REASON FOR CONSULTATION: Martha Butler is a 85 y.o. female with multiple medical problems including anemia, depression, GERD, and hypertension, who is followed by hematology for leukopenia and anemia with progressive fatigue and weight loss.  Patient was felt to have probable underlying MDS but previous bone marrow biopsies have been inconclusive.  Patient has decided not to pursue further work-up and was referred to palliative care to address goals and manage ongoing symptoms.  SOCIAL HISTORY:     reports that she has never smoked. She has never used smokeless tobacco. She reports that she does not drink alcohol and does not use drugs.   Patient is widowed.  She lives at home alone.  She has a son and 2 daughters and multiple grandchildren who are involved in her care.  Patient did not work outside the home.  ADVANCE DIRECTIVES:  Does not have  CODE STATUS:   PAST MEDICAL HISTORY: Past Medical History:  Diagnosis Date  . Anemia   . Depression   . GERD (gastroesophageal reflux disease)   . Hypertension     PAST SURGICAL HISTORY: No past surgical history on file.  HEMATOLOGY/ONCOLOGY HISTORY:  Oncology History   No history exists.    ALLERGIES:  has No Known Allergies.  MEDICATIONS:  Current Outpatient Medications  Medication Sig Dispense Refill  . aspirin EC 81 MG tablet Take 1 tablet by mouth daily.    . Blood Pressure Monitoring (BLOOD PRESSURE KIT) KIT     . docusate sodium (COLACE) 100 MG capsule Take 1 capsule (100 mg total) by mouth daily. Do not take if you have loose stools 90 capsule 1  . escitalopram (LEXAPRO) 5 MG tablet Take 5 mg by mouth daily.    . ferrous sulfate 325 (65 FE) MG tablet Take 1 tablet (325 mg total)  by mouth daily with breakfast. 60 tablet 3  . losartan (COZAAR) 50 MG tablet 50 mg daily.    Marland Kitchen omeprazole (PRILOSEC) 20 MG capsule Take 1 capsule by mouth daily.     No current facility-administered medications for this visit.    VITAL SIGNS: There were no vitals taken for this visit. There were no vitals filed for this visit.  Estimated body mass index is 20.29 kg/m as calculated from the following:   Height as of 02/03/20: _0  (1.753 m).   Weight as of 08/17/20: 137 lb 6.4 oz (62.3 kg).  LABS: CBC:    Component Value Date/Time   WBC 2.2 (L) 09/13/2020 1330   HGB 7.6 (L) 09/13/2020 1330   HCT 23.4 (L) 09/13/2020 1330   PLT 275 09/13/2020 1330   MCV 103.1 (H) 09/13/2020 1330   NEUTROABS 0.6 (L) 09/13/2020 1330   LYMPHSABS 1.3 09/13/2020 1330   MONOABS 0.2 09/13/2020 1330   EOSABS 0.1 09/13/2020 1330   BASOSABS 0.1 09/13/2020 1330   Comprehensive Metabolic Panel:    Component Value Date/Time   NA 139 08/17/2020 1320   K 4.1 08/17/2020 1320   CL 104 08/17/2020 1320   CO2 27 08/17/2020 1320   BUN 15 08/17/2020 1320   CREATININE 0.79 08/17/2020 1320   GLUCOSE 126 (H) 08/17/2020 1320   CALCIUM 8.7 (L) 08/17/2020 1320   AST 13 (L) 08/17/2020 1320   ALT 8  08/17/2020 1320   ALKPHOS 40 08/17/2020 1320   BILITOT 0.8 08/17/2020 1320   PROT 7.6 08/17/2020 1320   ALBUMIN 3.8 08/17/2020 1320    RADIOGRAPHIC STUDIES: No results found.  PERFORMANCE STATUS (ECOG) : 1 - Symptomatic but completely ambulatory  Review of Systems Unless otherwise noted, a complete review of systems is negative.  Physical Exam General: NAD Pulmonary: Unlabored Extremities: no edema, no joint deformities Skin: no rashes Neurological: Weakness but otherwise nonfocal  IMPRESSION: I met with patient and granddaughter today in the clinic.  I introduced palliative care services and attempted to establish therapeutic rapport.  Patient had lab work today.  She remains leukopenic and anemic but  labs are relatively unchanged from baseline.  She sees Dr. Tasia Catchings tomorrow.  Patient denies any significant changes or concerns today.  She has no symptomatic complaints.  She says at baseline, she lives at home alone and is functionally independent with her own care.  She has good support from her children and grandchildren when needed.  Patient denies any changes in appetite.  She says she has some days when she eats well and other days when she lacks a good appetite.  Will refer to RD for nutrition eval.  Patient confirms that she has no desire to pursue future bone marrow biopsy.  She is interested in transfusion if needed.  Patient has no ACP documents.  Her granddaughter thinks that her son would likely be her healthcare power of attorney.  I reviewed with her ACP documents today, which patient took home to discuss with family.  Also reviewed with her a MOST form.  PLAN: -Continue current scope of treatment -ACP/MOST form reviewed -Nutrition referral -Home-based PC -Follow-up MyChart visit in 1 month   Patient expressed understanding and was in agreement with this plan. She also understands that She can call the clinic at any time with any questions, concerns, or complaints.     Time Total: 30 minutes  Visit consisted of counseling and education dealing with the complex and emotionally intense issues of symptom management and palliative care in the setting of serious and potentially life-threatening illness.Greater than 50%  of this time was spent counseling and coordinating care related to the above assessment and plan.  Signed by: Altha Harm, PhD, NP-C

## 2020-09-13 NOTE — Progress Notes (Signed)
Patient gets tired easily.

## 2020-09-14 ENCOUNTER — Other Ambulatory Visit: Payer: Self-pay

## 2020-09-14 ENCOUNTER — Inpatient Hospital Stay (HOSPITAL_BASED_OUTPATIENT_CLINIC_OR_DEPARTMENT_OTHER): Payer: Medicare HMO | Admitting: Oncology

## 2020-09-14 ENCOUNTER — Inpatient Hospital Stay: Payer: Medicare HMO

## 2020-09-14 ENCOUNTER — Encounter: Payer: Self-pay | Admitting: Oncology

## 2020-09-14 VITALS — BP 137/67 | HR 74 | Temp 97.7°F | Resp 16 | Wt 134.3 lb

## 2020-09-14 DIAGNOSIS — D649 Anemia, unspecified: Secondary | ICD-10-CM | POA: Diagnosis not present

## 2020-09-14 DIAGNOSIS — D638 Anemia in other chronic diseases classified elsewhere: Secondary | ICD-10-CM

## 2020-09-14 DIAGNOSIS — D709 Neutropenia, unspecified: Secondary | ICD-10-CM | POA: Diagnosis not present

## 2020-09-14 DIAGNOSIS — D72819 Decreased white blood cell count, unspecified: Secondary | ICD-10-CM | POA: Diagnosis not present

## 2020-09-14 DIAGNOSIS — K219 Gastro-esophageal reflux disease without esophagitis: Secondary | ICD-10-CM | POA: Diagnosis not present

## 2020-09-14 DIAGNOSIS — Z7189 Other specified counseling: Secondary | ICD-10-CM | POA: Diagnosis not present

## 2020-09-14 DIAGNOSIS — R634 Abnormal weight loss: Secondary | ICD-10-CM

## 2020-09-14 DIAGNOSIS — I10 Essential (primary) hypertension: Secondary | ICD-10-CM | POA: Diagnosis not present

## 2020-09-14 DIAGNOSIS — F329 Major depressive disorder, single episode, unspecified: Secondary | ICD-10-CM | POA: Diagnosis not present

## 2020-09-14 MED ORDER — CIPROFLOXACIN HCL 250 MG PO TABS: 250.0000 mg | ORAL_TABLET | Freq: Two times a day (BID) | ORAL | 0 refills | Status: DC

## 2020-09-14 NOTE — Progress Notes (Signed)
Hematology/Oncology Follow up note [facility] Telephone:([phone] Fax:([phone]   Patient Care Team: Earlie Server, MD as PCP - General (Oncology)  REFERRING PROVIDER: Earlie Server, MD and Whitaker, [place] VISIT Follow up for management of leukopenia and anemia.   HISTORY OF PRESENTING ILLNESS:  Martha Butler is a  85 y.o.  female with PMH listed below who was referred to me for evaluation of low white count and anemia. Patient reports that she is been feeling tired recently quite stressed.  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 02/03/2020, repeat biopsy has very limited material is considered suboptimal for evaluation. Patient does not want to get any additional bone marrow biopsy done.  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 Patient is not interested in aggressive diagnostic testing, images or treatment.  I respect patient's wish.  Recommend supportive care.   INTERVAL HISTORY Martha Butler is a 85 y.o. female who has above history reviewed by me today presents for follow up for management of chronic pancytopenia. Patient was accompanied by patient's son.  Patient reports chronic fatigue which has not changed much.  She walks around Appetite is " so so".  She lost 3 pounds since 4 weeks ago.  Review of Systems  Constitutional: Positive for malaise/fatigue. Negative for chills, fever and weight loss.  HENT: Negative for sore throat.   Eyes: Negative for redness.  Respiratory: Negative for cough, shortness of breath and wheezing.   Cardiovascular: Negative for chest pain, palpitations and leg swelling.  Gastrointestinal: Negative for abdominal pain, blood in stool, nausea and vomiting.  Genitourinary: Negative for dysuria.   Musculoskeletal: Negative for myalgias.  Skin: Negative for rash.  Neurological: Negative for dizziness, tingling and tremors.  Endo/Heme/Allergies: Does not bruise/bleed easily.  Psychiatric/Behavioral: Negative for hallucinations.    MEDICAL HISTORY:  Past Medical History:  Diagnosis Date  . Anemia   . Depression   . GERD (gastroesophageal reflux disease)   . Hypertension     SURGICAL HISTORY: History reviewed. No pertinent surgical history.  SOCIAL HISTORY: Social History   Socioeconomic History  . Marital status: Widowed    Spouse name: Not on file  . Number of children: 3  . Years of education: Not on file  . Highest education level: Not on file  Occupational History  . Not on file  Tobacco Use  . Smoking status: Never Smoker  . Smokeless tobacco: Never Used  Substance and Sexual Activity  . Alcohol use: No  . Drug use: No  . Sexual activity: Not on file  Other Topics Concern  . Not on file  Social History Narrative  . Not on file   Social Determinants of Health   Financial Resource Strain:   . Difficulty of Paying Living Expenses: Not on file  Food Insecurity:   . Worried About Charity fundraiser in the Last Year: Not on file  . Ran Out of Food in the Last Year: Not on file  Transportation Needs:   . Lack of Transportation (Medical): Not on file  . Lack of Transportation (Non-Medical): Not on file  Physical Activity:   . Days of Exercise per Week: Not on file  . Minutes of Exercise per Session: Not on file  Stress:   . Feeling of Stress :  Not on file  Social Connections:   . Frequency of Communication with Friends and Family: Not on file  . Frequency of Social Gatherings with Friends and Family: Not on file  . Attends Religious Services: Not on file  . Active Member of Clubs or Organizations: Not on file  . Attends Archivist Meetings: Not on file  . Marital Status: Not on file  Intimate Partner Violence:   . Fear of Current or  Ex-Partner: Not on file  . Emotionally Abused: Not on file  . Physically Abused: Not on file  . Sexually Abused: Not on file    FAMILY HISTORY: History reviewed. No pertinent family history.  ALLERGIES:  has No Known Allergies.  MEDICATIONS:  Current Outpatient Medications  Medication Sig Dispense Refill  . aspirin EC 81 MG tablet Take 1 tablet by mouth daily.    . Blood Pressure Monitoring (BLOOD PRESSURE KIT) KIT     . docusate sodium (COLACE) 100 MG capsule Take 1 capsule (100 mg total) by mouth daily. Do not take if you have loose stools 90 capsule 1  . escitalopram (LEXAPRO) 5 MG tablet Take 5 mg by mouth daily.    . ferrous sulfate 325 (65 FE) MG tablet Take 1 tablet (325 mg total) by mouth daily with breakfast. 60 tablet 3  . losartan (COZAAR) 50 MG tablet 50 mg daily.    Marland Kitchen omeprazole (PRILOSEC) 20 MG capsule Take 1 capsule by mouth daily.    . ciprofloxacin (CIPRO) 250 MG tablet Take 1 tablet (250 mg total) by mouth 2 (two) times daily. 60 tablet 0   No current facility-administered medications for this visit.     PHYSICAL EXAMINATION: ECOG PERFORMANCE STATUS: 1 - Symptomatic but completely ambulatory Vitals:   09/14/20 0854  BP: 137/67  Pulse: 74  Resp: 16  Temp: 97.7 F (36.5 C)   Filed Weights   09/14/20 0854  Weight: 134 lb 4.8 oz (60.9 kg)    Physical Exam Constitutional:      General: She is not in acute distress.    Appearance: She is not diaphoretic.  HENT:     Head: Normocephalic and atraumatic.     Nose: Nose normal.     Mouth/Throat:     Pharynx: No oropharyngeal exudate.  Eyes:     General: No scleral icterus.       Right eye: No discharge.        Left eye: No discharge.     Pupils: Pupils are equal, round, and reactive to light.  Neck:     Vascular: No JVD.  Cardiovascular:     Rate and Rhythm: Normal rate and regular rhythm.     Heart sounds: Murmur heard.      Comments: Systolic murmur Pulmonary:     Effort: Pulmonary effort is  normal. No respiratory distress.     Breath sounds: Normal breath sounds. No wheezing or rales.  Chest:     Chest wall: No tenderness.  Abdominal:     General: Bowel sounds are normal. There is no distension.     Palpations: Abdomen is soft. There is no mass.     Tenderness: There is no abdominal tenderness. There is no rebound.  Musculoskeletal:        General: No tenderness. Normal range of motion.     Cervical back: Normal range of motion and neck supple.  Lymphadenopathy:     Cervical: No cervical adenopathy.  Skin:    General: Skin is  warm and dry.     Findings: No erythema or rash.  Neurological:     Mental Status: She is alert and oriented to person, place, and time.     Cranial Nerves: No cranial nerve deficit.     Motor: No abnormal muscle tone.     Coordination: Coordination normal.  Psychiatric:        Mood and Affect: Affect normal.        Judgment: Judgment normal.      LABORATORY DATA:  I have reviewed the data as listed Lab Results  Component Value Date   WBC 2.2 (L) 09/13/2020   HGB 7.6 (L) 09/13/2020   HCT 23.4 (L) 09/13/2020   MCV 103.1 (H) 09/13/2020   PLT 275 09/13/2020   Recent Labs    01/23/20 1320 08/17/20 1320 09/13/20 1330  NA 138 139 138  K 4.1 4.1 3.9  CL 105 104 103  CO2 _0 GLUCOSE 130* 126* 157*  BUN _1 CREATININE 0.99 0.79 1.04*  CALCIUM 9.0 8.7* 9.0  GFRNONAA 53* >60 53*  GFRAA >60  --   --   PROT 8.1 7.6 8.4*  ALBUMIN 4.2 3.8 4.3  AST 15 13* 11*  ALT _2 ALKPHOS 49 40 41  BILITOT 0.7 0.8 0.7    Normal vitamin B12, Folate, iron/TIBC, ferritin, TSH normal. No monoclonal protein. Normal LDH, TSH.   ASSESSMENT & PLAN:  1. Anemia of chronic disease   2. Unintentional weight loss   3. Neutropenia, unspecified type ([place])    #Leukopenia, predominantly neutropenia.  ANC 0.6, progressively worsening Discussed with patient and son that I will add prophylactic Cipro.  Prescription sent to pharmacy. #Anemia,  hemoglobin remains fairly stable comparing to 4 weeks ago.  Hemoglobin 7.6 today. Her clinical picture is consistent with underlying MDS, previous bone marrow was not diagnostic and she declined repeating procedure. Not interested in aggressive treatment.  Patient has establish care with palliative care service. Since hemoglobin has been stable, I will hold off blood transfusion for now. Consider starting erythropoietin therapy. Check EPO level  #Weight loss, patient has been referred to nutritionist.  Son wants to reschedule her appointment with nutritionist due to time conflict with his work.  Appointment will be rescheduled  Patient will follow up in 4 weeks, or tubes possible blood transfusion. Earlie Server, MD, PhD Hematology Oncology [place] at [facility] 09/14/2020

## 2020-09-14 NOTE — Progress Notes (Signed)
Patient reports feeling fatigued and a decrease in appetite.

## 2020-09-16 ENCOUNTER — Inpatient Hospital Stay: Payer: Medicare HMO

## 2020-09-22 ENCOUNTER — Telehealth: Payer: Self-pay | Admitting: Primary Care

## 2020-09-22 NOTE — Telephone Encounter (Signed)
Called patient's son, Aurther Loft to offer to schedule a Palliative Consult, no answer - left message with reason for call along with the name and call back number.  4:25 PM:  Rec'd return call from [place] and after discussing the Palliative referral/services he was in agreement with scheduling visit.  I have scheduled an In-person Consult for 10/05/20 @ 3 PM.

## 2020-10-04 ENCOUNTER — Other Ambulatory Visit: Payer: Self-pay

## 2020-10-04 ENCOUNTER — Other Ambulatory Visit: Payer: Medicare HMO | Admitting: Primary Care

## 2020-10-04 DIAGNOSIS — R634 Abnormal weight loss: Secondary | ICD-10-CM

## 2020-10-04 DIAGNOSIS — Z515 Encounter for palliative care: Secondary | ICD-10-CM

## 2020-10-04 DIAGNOSIS — F419 Anxiety disorder, unspecified: Secondary | ICD-10-CM

## 2020-10-04 DIAGNOSIS — D638 Anemia in other chronic diseases classified elsewhere: Secondary | ICD-10-CM

## 2020-10-04 NOTE — Progress Notes (Signed)
[facility] Palliative Care Consult Note Telephone: [phone]  Fax: [phone]     Date of encounter: 10/04/20 PATIENT NAME: Martha Butler [place] [address] [phone] (home)  DOB: 1935-04-07 MRN: [id number]  PRIMARY CARE PROVIDER:    Earlie Server, MD,  [place] [address] [phone]  REFERRING PROVIDER:   Earlie Server, MD [place],  [place] [address] [phone]  RESPONSIBLE PARTY:   Extended Emergency Contact Information Primary Emergency Contact: [place] Mobile Phone: [phone] Relation: Son  I met face to face with patient and family in  Home. Palliative Care was asked to follow this patient by consultation request of Earlie Server, MD to help address advance care planning and goals of care. This is the initial visit.   ASSESSMENT AND RECOMMENDATIONS:   1. Advance Care Planning/Goals of Care: Goals include to maximize quality of life and symptom management. Our advance care planning conversation included a discussion about:     The value and importance of advance care planning   Experiences with loved ones who have been seriously ill or have died   Exploration of personal, cultural or spiritual beliefs that might influence medical decisions   Exploration of goals of care in the event of a sudden injury or illness   Identification and preparation of a healthcare agent - Son Coralyn Mark  Review of an  advance directive document   Discussed advance directives. Son states he would do what he did with his dad is try everything but if there is not  A chance of survival, he would stop. Later he outlined he did not want his other to know the gravity of her illness and that they didn't want to pursue dx or rx.   We discussed MOST form and they will review and we will discuss on next visit.   2. Symptom Management:   Med compliance: Not taking meds on time  or at all in most cases, discovered on history taking and med review. Has not been taking Iron.  Needs meds on  pre packaged plan. Needs Senna-S, Aspirin 81 mg , FESO4 325 mg bid, lexapro 5 mg , taking cipro.Son states he was trusting his mother's assent to taking meds, but her mentation is not reliable. I've ordered these meds to [facility] for pre packaging. I asked them to monitor BP to decide about restarting losartan.  PCP: has not seen in a while. I asked to re establish and have a check in. PCP should refer to [facility] program so pt can get in home ADL care.   Dementia: Family feels safety is ok now, pt does not wander and family is next door. Pt is poor historian which impacts care. Son discussed he will be more aware.  Caregiver:  could benefit from [facility] and [facility]. Has medicaid. Instructed to call DSS social medicaid and PCP to order.  Nutrition: Takes food and limited nutritional drinks. Encouraged to increase. Albumin 3 weeks ago WNL however. Continue to monitor.  3. Follow up Palliative Care Visit: Palliative care will continue to follow for goals of care clarification and symptom management. Return 4-6 weeks or prn.  4. Family /Caregiver/Community Supports: Son is poa, there are other family who help. Lives in own home. Gets MOW.  5. Cognitive / Functional decline: A and O x 2, very forgetful and poor historian. Able to do some adls with cueing, dependent in most iadls.  I spent 75 minutes providing this consultation,  from 1400 to 1515. More than 50% of the time in this consultation was spent coordinating communication.   CODE STATUS: FULL  PPS: 50%  HOSPICE ELIGIBILITY/DIAGNOSIS: TBD  Subjective:  CHIEF COMPLAINT: frailty  HISTORY OF PRESENT ILLNESS:  Martha Butler is a 85 y.o. year old female  with anemia,  HTN, caregiver strain, dementia.  We are asked to consult around advance care planning and complex medical decision making.    Review and summarization of old 39 records  shows or history from other than patient  shows course of disease Rx at cancer center. Review or lab tests: albumin 4.3 Review of case with family member son who is POA and good historian.  History obtained from review of EMR, discussion with primary team, and  interview with family, caregiver  and/or Ms. Deviney. Records reviewed and summarized above.   CURRENT PROBLEM LIST:  Patient Active Problem List   Diagnosis Date Noted  . Goals of care, counseling/discussion 09/14/2020  . Neutropenia ([place]) 09/14/2020  . Unintentional weight loss 09/14/2020  . Anemia of chronic disease 09/14/2020  . Anemia, unspecified 10/26/2016  . Anxiety and depression 06/29/2016  . Benign essential hypertension 06/29/2016  . Caregiver stress 06/29/2016   PAST MEDICAL HISTORY:  Active Ambulatory Problems    Diagnosis Date Noted  . Anemia, unspecified 10/26/2016  . Anxiety and depression 06/29/2016  . Benign essential hypertension 06/29/2016  . Caregiver stress 06/29/2016  . Goals of care, counseling/discussion 09/14/2020  . Neutropenia ([place]) 09/14/2020  . Unintentional weight loss 09/14/2020  . Anemia of chronic disease 09/14/2020   Resolved Ambulatory Problems    Diagnosis Date Noted  . No Resolved Ambulatory Problems   Past Medical History:  Diagnosis Date  . Anemia   . Depression   . GERD (gastroesophageal reflux disease)   . Hypertension    SOCIAL HX:  Social History   Tobacco Use  . Smoking status: Never Smoker  . Smokeless tobacco: Never Used  Substance Use Topics  . Alcohol use: No   FAMILY HX: No family history on file.    ALLERGIES: No Known Allergies   PERTINENT MEDICATIONS:  Outpatient Encounter Medications as of 83/03/2020  Medication Sig  . aspirin EC 81 MG tablet Take 1 tablet by mouth daily.  . Blood Pressure Monitoring (BLOOD PRESSURE KIT) KIT   . ciprofloxacin (CIPRO) 250 MG tablet Take 1 tablet (250 mg total) by mouth 2 (two) times daily.  Marland Kitchen docusate sodium  (COLACE) 100 MG capsule Take 1 capsule (100 mg total) by mouth daily. Do not take if you have loose stools  . escitalopram (LEXAPRO) 5 MG tablet Take 5 mg by mouth daily.  . ferrous sulfate 325 (65 FE) MG tablet Take 1 tablet (325 mg total) by mouth daily with breakfast.  . losartan (COZAAR) 50 MG tablet 50 mg daily.  Marland Kitchen omeprazole (PRILOSEC) 20 MG capsule Take 1 capsule by mouth daily.   No facility-administered encounter medications on file as of 83/03/2020.    Objective: ROS  General: NAD EYES: denies vision changes ENMT: denies dysphagia Cardiovascular: denies chest pain Pulmonary: denies  cough, denies increased SOB Abdomen: endorses fair appetite, endorses  occ constipation, endorses continence of bowel GU: denies dysuria, endorses continence of urine MSK:  endorses ROM limitations, no falls reported Skin: denies rashes or wounds Neurological: endorses general weakness, denies pain, denies insomnia Psych: Endorses positive mood Heme/lymph/immuno: denies bruises, abnormal bleeding  Physical Exam: Current and past  weights:134 lbs. Constitutional: early satiety General: frail appearing, thin EYES: anicteric sclera,lids intact, no discharge  ENMT: intact hearing,oral mucous membranes moist, edentulous CV: no LE edema Pulmonary:  no increased work of breathing, no cough, no audible wheezes, room air Abdomen: intake 50%,  no ascites GU: deferred MSK: moderate sarcopenia, decreased ROM in all extremities, no contractures of LE, ambulatory Skin: warm and dry, no rashes or wounds on visible skin Neuro: Generalized weakness, severe cognitive impairment Psych: non-anxious affect, A and O x 2 Hem/lymph/immuno: no widespread bruising   Thank you for the opportunity to participate in the care of Ms. Skufca.  The palliative care team will continue to follow. Please call our office at [phone] if we can be of additional assistance.  Jason Coop, NP , DNP, MPH,  AGPCNP-BC, ACHPN  COVID-19 PATIENT SCREENING TOOL  Person answering questions: ____________son Terry______ _____   1.  Is the patient or any family member in the home showing any signs or symptoms regarding respiratory infection?               Person with Symptom- __________NA_________________  a. Fever                                                                          Yes___ No___          ___________________  b. Shortness of breath                                                    Yes___ No___          ___________________ c. Cough/congestion                                       Yes___  No___         ___________________ d. Body aches/pains                                                         Yes___ No___        ____________________ e. Gastrointestinal symptoms (diarrhea, nausea)           Yes___ No___        ____________________  2. Within the past 14 days, has anyone living in the home had any contact with someone with or under investigation for COVID-19?    Yes___ No_X_   Person __________________

## 2020-10-05 ENCOUNTER — Other Ambulatory Visit: Payer: Self-pay | Admitting: Primary Care

## 2020-10-11 ENCOUNTER — Inpatient Hospital Stay: Payer: Medicare HMO | Attending: Oncology

## 2020-10-11 DIAGNOSIS — R634 Abnormal weight loss: Secondary | ICD-10-CM | POA: Insufficient documentation

## 2020-10-11 DIAGNOSIS — D72819 Decreased white blood cell count, unspecified: Secondary | ICD-10-CM | POA: Insufficient documentation

## 2020-10-11 DIAGNOSIS — D649 Anemia, unspecified: Secondary | ICD-10-CM | POA: Insufficient documentation

## 2020-10-11 DIAGNOSIS — R5383 Other fatigue: Secondary | ICD-10-CM | POA: Insufficient documentation

## 2020-10-11 NOTE — Progress Notes (Signed)
Nutrition Assessment:  85 year old female with leukopenia and anemia.  Patient does not want additional biopsies.  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  Past medical history of GERD, depression, HTN  Met with patient and son in clinic.  Patient reports that her appetite is up and down.  Reports that she ate pancake this am and drank juice.  Lunch was 4-5 bites of chicken pie and 1/2 barbecue sandwich.  Likes ensure shakes but son reports will drink shake and not eat anything. Patient says that she feel full.  Has issues with constipation and has not been taking medications as prescribed.  Patient is now taking medication as prescribed per son.  Patient lives alone. Son checks on frequently.    Son reports memory issues.   Note from home based Palliative care reviewed.   Medications: colace, ferrous sulfate  Labs: reviewed  Anthropometrics:   Height: 69 inches Weight: 134 lb 4 oz today 145 lb 4/16/231 BMI: 19  7% weight loss in the last 8 months   NUTRITION DIAGNOSIS: Inadequate oral intake related to dementia, ?? MDS, constipation as evidenced by 7% weight loss, fair appetite.    INTERVENTION:  Discussed strategies to help with increasing calories and protein.  Handout provided to patient and son.  Encouraged writing down times to "nibble,have snack" q 2 hours and posting in visible location to remind patient to eat.   Son will prepare foods in zip loc bags (cut sandwich in half, etc) so can better monitor intake.  Encouraged high calorie ensure (350 calories or more) Gave samples of ensure complete to try and coupons.  Encouraged stool softner to help with constipation.  Contact information provided    MONITORING, EVALUATION, GOAL: weight trends, intake   NEXT VISIT: Jan 20, f/u in clinic  [place]. Zenia Resides, [place], [place] Registered Dietitian [phone] (mobile)

## 2020-10-12 ENCOUNTER — Inpatient Hospital Stay (HOSPITAL_BASED_OUTPATIENT_CLINIC_OR_DEPARTMENT_OTHER): Payer: Medicare HMO | Admitting: Oncology

## 2020-10-12 ENCOUNTER — Other Ambulatory Visit: Payer: Self-pay

## 2020-10-12 ENCOUNTER — Ambulatory Visit
Admission: RE | Admit: 2020-10-12 | Discharge: 2020-10-12 | Disposition: A | Discharge status: Home / Self Care | Payer: Medicare HMO | Source: Ambulatory Visit | Attending: Oncology | Admitting: Oncology

## 2020-10-12 ENCOUNTER — Inpatient Hospital Stay: Payer: Medicare HMO

## 2020-10-12 ENCOUNTER — Encounter: Payer: Self-pay | Admitting: Oncology

## 2020-10-12 ENCOUNTER — Ambulatory Visit: Admission: RE | Admit: 2020-10-12 | Payer: Medicare HMO | Source: Ambulatory Visit | Admitting: Oncology

## 2020-10-12 VITALS — BP 103/61 | HR 92 | Temp 98.5°F | Resp 16 | Wt 134.5 lb

## 2020-10-12 DIAGNOSIS — D649 Anemia, unspecified: Secondary | ICD-10-CM | POA: Diagnosis not present

## 2020-10-12 DIAGNOSIS — R634 Abnormal weight loss: Secondary | ICD-10-CM

## 2020-10-12 DIAGNOSIS — Z23 Encounter for immunization: Secondary | ICD-10-CM | POA: Diagnosis not present

## 2020-10-12 DIAGNOSIS — F32A Depression, unspecified: Secondary | ICD-10-CM | POA: Diagnosis not present

## 2020-10-12 DIAGNOSIS — F0390 Unspecified dementia without behavioral disturbance: Secondary | ICD-10-CM | POA: Diagnosis not present

## 2020-10-12 DIAGNOSIS — R5383 Other fatigue: Secondary | ICD-10-CM | POA: Diagnosis not present

## 2020-10-12 DIAGNOSIS — D709 Neutropenia, unspecified: Secondary | ICD-10-CM | POA: Diagnosis not present

## 2020-10-12 DIAGNOSIS — D531 Other megaloblastic anemias, not elsewhere classified: Secondary | ICD-10-CM | POA: Diagnosis not present

## 2020-10-12 DIAGNOSIS — D638 Anemia in other chronic diseases classified elsewhere: Secondary | ICD-10-CM

## 2020-10-12 DIAGNOSIS — D708 Other neutropenia: Secondary | ICD-10-CM | POA: Diagnosis not present

## 2020-10-12 DIAGNOSIS — D72819 Decreased white blood cell count, unspecified: Secondary | ICD-10-CM | POA: Diagnosis not present

## 2020-10-12 LAB — COMPREHENSIVE METABOLIC PANEL
ALT: 9 U/L (ref 0–44)
AST: 14 U/L — ABNORMAL LOW (ref 15–41)
Albumin: 4.1 g/dL (ref 3.5–5.0)
Alkaline Phosphatase: 36 U/L — ABNORMAL LOW (ref 38–126)
Anion gap: 12 (ref 5–15)
BUN: 22 mg/dL (ref 8–23)
CO2: 25 mmol/L (ref 22–32)
Calcium: 8.9 mg/dL (ref 8.9–10.3)
Chloride: 101 mmol/L (ref 98–111)
Creatinine, Ser: 1.01 mg/dL — ABNORMAL HIGH (ref 0.44–1.00)
GFR, Estimated: 55 mL/min — ABNORMAL LOW (ref >60)
Glucose, Bld: 124 mg/dL — ABNORMAL HIGH (ref 70–99)
Potassium: 3.7 mmol/L (ref 3.5–5.1)
Sodium: 138 mmol/L (ref 135–145)
Total Bilirubin: 1 mg/dL (ref 0.3–1.2)
Total Protein: 8.1 g/dL (ref 6.5–8.1)

## 2020-10-12 LAB — CBC WITH DIFFERENTIAL/PLATELET
Abs Immature Granulocytes: 0.01 10*3/uL (ref 0.00–0.07)
Basophils Absolute: 0.1 10*3/uL (ref 0.0–0.1)
Basophils Relative: 4 %
Eosinophils Absolute: 0 10*3/uL (ref 0.0–0.5)
Eosinophils Relative: 2 %
HCT: 22.9 % — ABNORMAL LOW (ref 36.0–46.0)
Hemoglobin: 7.2 g/dL — ABNORMAL LOW (ref 12.0–15.0)
Immature Granulocytes: 1 %
Lymphocytes Relative: 37 %
Lymphs Abs: 0.7 10*3/uL (ref 0.7–4.0)
MCH: 32.9 pg (ref 26.0–34.0)
MCHC: 31.4 g/dL (ref 30.0–36.0)
MCV: 104.6 fL — ABNORMAL HIGH (ref 80.0–100.0)
Monocytes Absolute: 0.2 10*3/uL (ref 0.1–1.0)
Monocytes Relative: 12 %
Neutro Abs: 0.8 10*3/uL — ABNORMAL LOW (ref 1.7–7.7)
Neutrophils Relative %: 44 %
Platelets: 235 10*3/uL (ref 150–400)
RBC: 2.19 MIL/uL — ABNORMAL LOW (ref 3.87–5.11)
RDW: 16.9 % — ABNORMAL HIGH (ref 11.5–15.5)
WBC: 1.7 10*3/uL — ABNORMAL LOW (ref 4.0–10.5)
nRBC: 0 % (ref 0.0–0.2)

## 2020-10-12 LAB — RETIC PANEL
Immature Retic Fract: 22.1 % — ABNORMAL HIGH (ref 2.3–15.9)
RBC.: 2.13 MIL/uL — ABNORMAL LOW (ref 3.87–5.11)
Retic Count, Absolute: 39.6 10*3/uL (ref 19.0–186.0)
Retic Ct Pct: 1.9 % (ref 0.4–3.1)
Reticulocyte Hemoglobin: 31.5 pg (ref >27.9)

## 2020-10-12 LAB — TSH: TSH: 2.694 u[IU]/mL (ref 0.350–4.500)

## 2020-10-12 LAB — SAMPLE TO BLOOD BANK

## 2020-10-12 NOTE — Progress Notes (Signed)
Patient denies new problems/concerns today.   °

## 2020-10-12 NOTE — Progress Notes (Signed)
Hematology/Oncology Follow up note [facility] [phone] Fax:([phone]   Patient Care Team: Earlie Server, MD as PCP - General (Oncology)  REFERRING PROVIDER: Earlie Server, MD and [place], [place] VISIT Follow up for management of leukopenia and anemia.   HISTORY OF PRESENTING ILLNESS:  Martha Butler is a  85 y.o.  female with PMH listed below who was referred to me for evaluation of low white count and anemia. Patient reports that she is been feeling tired recently quite stressed.  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 02/03/2020, repeat biopsy has very limited material is considered suboptimal for evaluation. Patient does not want to get any additional bone marrow biopsy done.  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 Patient is not interested in aggressive diagnostic testing, images or treatment.  I respect patient's wish.  Recommend supportive care.   INTERVAL HISTORY Martha Butler is a 85 y.o. female who has above history reviewed by me today presents for follow up for management of chronic pancytopenia. Patient was accompanied by patient's son.  She reports feeling well at her baseline.  Appetite is so-so. Chronic fatigue, not significantly worse.  Review of Systems  Constitutional: Positive for malaise/fatigue. Negative for chills, fever and weight loss.  HENT: Negative for sore throat.   Eyes: Negative for redness.  Respiratory: Negative for cough, shortness of breath and wheezing.   Cardiovascular: Negative for chest pain, palpitations and leg swelling.  Gastrointestinal: Negative for abdominal pain, blood in stool, nausea and vomiting.  Genitourinary: Negative for dysuria.  Musculoskeletal: Negative for myalgias.   Skin: Negative for rash.  Neurological: Negative for dizziness, tingling and tremors.  Endo/Heme/Allergies: Does not bruise/bleed easily.  Psychiatric/Behavioral: Negative for hallucinations.    MEDICAL HISTORY:  Past Medical History:  Diagnosis Date  . Anemia   . Depression   . GERD (gastroesophageal reflux disease)   . Hypertension     SURGICAL HISTORY: History reviewed. No pertinent surgical history.  SOCIAL HISTORY: Social History   Socioeconomic History  . Marital status: Widowed    Spouse name: Not on file  . Number of children: 3  . Years of education: Not on file  . Highest education level: Not on file  Occupational History  . Not on file  Tobacco Use  . Smoking status: Never Smoker  . Smokeless tobacco: Never Used  Substance and Sexual Activity  . Alcohol use: No  . Drug use: No  . Sexual activity: Not on file  Other Topics Concern  . Not on file  Social History Narrative  . Not on file   Social Determinants of Health   Financial Resource Strain: Not on file  Food Insecurity: Not on file  Transportation Needs: Not on file  Physical Activity: Not on file  Stress: Not on file  Social Connections: Not on file  Intimate Partner Violence: Not on file    FAMILY HISTORY: History reviewed. No pertinent family history.  ALLERGIES:  has No Known Allergies.  MEDICATIONS:  Current Outpatient Medications  Medication Sig Dispense Refill  . aspirin EC 81 MG tablet Take 1 tablet by mouth daily.    . ciprofloxacin (CIPRO) 250 MG tablet Take 1 tablet (250 mg total) by mouth 2 (two) times daily. 60 tablet 0  . docusate  sodium (COLACE) 100 MG capsule Take 1 capsule (100 mg total) by mouth daily. Do not take if you have loose stools 90 capsule 1  . ferrous sulfate 325 (65 FE) MG tablet Take 1 tablet (325 mg total) by mouth daily with breakfast. 60 tablet 3  . losartan (COZAAR) 50 MG tablet 50 mg daily.    Marland Kitchen escitalopram (LEXAPRO) 5 MG tablet Take 5 mg by mouth  daily. (Patient not taking: No sig reported)     No current facility-administered medications for this visit.     PHYSICAL EXAMINATION: ECOG PERFORMANCE STATUS: 1 - Symptomatic but completely ambulatory Vitals:   10/12/20 1008  BP: 103/61  Pulse: 92  Resp: 16  Temp: 98.5 F (36.9 C)   Filed Weights   10/12/20 1008  Weight: 134 lb 8 oz (61 kg)    Physical Exam Constitutional:      General: She is not in acute distress.    Appearance: She is not diaphoretic.  HENT:     Head: Normocephalic and atraumatic.     Nose: Nose normal.     Mouth/Throat:     Pharynx: No oropharyngeal exudate.  Eyes:     General: No scleral icterus.       Right eye: No discharge.        Left eye: No discharge.     Pupils: Pupils are equal, round, and reactive to light.  Neck:     Vascular: No JVD.  Cardiovascular:     Rate and Rhythm: Normal rate and regular rhythm.     Heart sounds: Murmur heard.      Comments: Systolic murmur Pulmonary:     Effort: Pulmonary effort is normal. No respiratory distress.     Breath sounds: Normal breath sounds. No wheezing or rales.  Chest:     Chest wall: No tenderness.  Abdominal:     General: Bowel sounds are normal. There is no distension.     Palpations: Abdomen is soft. There is no mass.     Tenderness: There is no abdominal tenderness. There is no rebound.  Musculoskeletal:        General: No tenderness. Normal range of motion.     Cervical back: Normal range of motion and neck supple.  Lymphadenopathy:     Cervical: No cervical adenopathy.  Skin:    General: Skin is warm and dry.     Findings: No erythema or rash.  Neurological:     Mental Status: She is alert and oriented to person, place, and time.     Cranial Nerves: No cranial nerve deficit.     Motor: No abnormal muscle tone.     Coordination: Coordination normal.  Psychiatric:        Mood and Affect: Affect normal.        Judgment: Judgment normal.      LABORATORY DATA:  I have  reviewed the data as listed Lab Results  Component Value Date   WBC 1.7 (L) 10/12/2020   HGB 7.2 (L) 10/12/2020   HCT 22.9 (L) 10/12/2020   MCV 104.6 (H) 10/12/2020   PLT 235 10/12/2020   Recent Labs    01/23/20 1320 08/17/20 1320 09/13/20 1330 10/12/20 0940  NA 138 139 138 138  K 4.1 4.1 3.9 3.7  CL 105 104 103 101  CO2 25 27 29 25   GLUCOSE 130* 126* 157* 124*  BUN 15 15 14 22   CREATININE 0.99 0.79 1.04* 1.01*  CALCIUM 9.0 8.7* 9.0 8.9  GFRNONAA 53* >60 53* 55*  GFRAA >60  --   --   --   PROT 8.1 7.6 8.4* 8.1  ALBUMIN 4.2 3.8 4.3 4.1  AST 15 13* 11* 14*  ALT 8 8 8 9   ALKPHOS 49 40 41 36*  BILITOT 0.7 0.8 0.7 1.0    Normal vitamin B12, Folate, iron/TIBC, ferritin, TSH normal. No monoclonal protein. Normal LDH, TSH.   ASSESSMENT & PLAN:  1. Anemia of chronic disease   2. Neutropenia, unspecified type ([place])   3. Unintentional weight loss    #Leukopenia, predominantly neutropenia.  ANC 0.8, progressively worsening Continue prophylactic Cipro. #Anemia, hemoglobin continues to decrease to 7.3 today. She reports feeling fatigued but does not want to have blood transfusion today.  We will continue monitor. She declines repeating bone marrow biopsy for further evaluation.  I suspect that she has underlying MDS. Check erythropoietin, if less than 500, may consider starting her on erythropoietin monthly injection to further support her hemoglobin. Rationale and potential side effects of erythropoietin treatments were discussed with patient and her son and both of them are in agreement.  #Weight loss, she has lost about 15 to 20 pounds since last year.  I will obtain a CT abdomen pelvis for further evaluation.  Check TSH.  Never smoker, check x-ray  Patient will follow up in 4 weeks, or tubes possible blood transfusion. Earlie Server, MD, PhD Hematology Oncology [place] at [facility] 10/12/2020

## 2020-10-13 ENCOUNTER — Inpatient Hospital Stay (HOSPITAL_BASED_OUTPATIENT_CLINIC_OR_DEPARTMENT_OTHER): Payer: Medicare HMO | Admitting: Hospice and Palliative Medicine

## 2020-10-13 DIAGNOSIS — Z515 Encounter for palliative care: Secondary | ICD-10-CM

## 2020-10-13 LAB — ERYTHROPOIETIN: Erythropoietin: 750.2 m[IU]/mL — ABNORMAL HIGH (ref 2.6–18.5)

## 2020-10-13 NOTE — Progress Notes (Signed)
I attempted to call patient but was unable to reach her for scheduled MyChart visit.  Will reschedule.

## 2020-10-15 ENCOUNTER — Other Ambulatory Visit: Payer: Self-pay

## 2020-10-15 ENCOUNTER — Ambulatory Visit
Admission: RE | Admit: 2020-10-15 | Discharge: 2020-10-15 | Disposition: A | Discharge status: Home / Self Care | Payer: Medicare HMO | Source: Ambulatory Visit | Attending: Oncology | Admitting: Oncology

## 2020-10-15 DIAGNOSIS — R634 Abnormal weight loss: Secondary | ICD-10-CM | POA: Diagnosis not present

## 2020-10-15 DIAGNOSIS — K6289 Other specified diseases of anus and rectum: Secondary | ICD-10-CM | POA: Diagnosis not present

## 2020-10-15 DIAGNOSIS — D649 Anemia, unspecified: Secondary | ICD-10-CM | POA: Diagnosis not present

## 2020-10-15 DIAGNOSIS — K449 Diaphragmatic hernia without obstruction or gangrene: Secondary | ICD-10-CM | POA: Diagnosis not present

## 2020-10-15 MED ORDER — IOHEXOL 300 MG/ML  SOLN
100.0000 mL | Freq: Once | INTRAMUSCULAR | Status: AC | PRN
  Administered 2020-10-15: 09:00:00 75 mL via INTRAVENOUS

## 2020-11-03 ENCOUNTER — Other Ambulatory Visit: Payer: Self-pay | Admitting: Oncology

## 2020-11-04 ENCOUNTER — Other Ambulatory Visit: Payer: Self-pay

## 2020-11-04 ENCOUNTER — Other Ambulatory Visit: Payer: Medicare HMO | Admitting: Primary Care

## 2020-11-04 DIAGNOSIS — D638 Anemia in other chronic diseases classified elsewhere: Secondary | ICD-10-CM

## 2020-11-04 DIAGNOSIS — R634 Abnormal weight loss: Secondary | ICD-10-CM

## 2020-11-04 DIAGNOSIS — Z515 Encounter for palliative care: Secondary | ICD-10-CM

## 2020-11-04 NOTE — Progress Notes (Signed)
[facility] Palliative Care Consult Note Telephone: [phone]  Fax: [phone]     Date of encounter: 11/04/20 PATIENT NAME: Martha Butler [place] [address] [phone] (home)  DOB: Apr 09, 1935 MRN: [id number]  PRIMARY CARE PROVIDER:    Gladstone Lighter, MD,  [place] [address] [phone]  REFERRING PROVIDER:   Earlie Server, MD [place],  [place] [address] [phone]  RESPONSIBLE PARTY:   Extended Emergency Contact Information Primary Emergency Contact: [place] Mobile Phone: [phone] Relation: Son  I met face to face with patient in home. Palliative Care was asked to follow this patient by consultation request of Earlie Server, MD to help address advance care planning and goals of care. This is a follow up  visit.   ASSESSMENT AND RECOMMENDATIONS:   1. Advance Care Planning/Goals of Care: Goals include to maximize quality of life and symptom management. Our advance care planning conversation included a discussion about:     The value and importance of advance care planning   Experiences with loved ones who have been seriously ill or have died   Exploration of personal, cultural or spiritual beliefs that might influence medical decisions  MOST is still in home not completed. Son was to have discussed. I will make appt  Next visit whereby son is available and we can revisit her ACP decisions.  2. Symptom Management:   PAIN: Chronic pain reported, would benefit from acetaminophen 1300 mg q 12 hrs. added to her med pack if PCP agrees. Liver functions reviewed.  Constipation: Start miralax mixed into any drink she likes. Has been picking out iron from med pack due to constipation.  Well care: Declines covid vaccine saying oncology advised against it. Has not had eye exam in a long time, I recommend this. Has dentures but does not use. Has not had  dental exam in a long time, again recommended to f/u with exam  Nutrition: wt stable and albumin wnl. States MOW. States she has nutritional supplements too.   3. Follow up Palliative Care Visit: Palliative care will continue to follow for goals of care clarification and symptom management. Return 8 weeks or prn.  4. Family /Caregiver/Community Supports: son is poa and family lives nearby. Pt is in own home.  5. Cognitive / Functional decline: A and O x 3, forgetful at times. Able to ambulate, do many adls, needs some cueing. Needs assistance with iadls.  I spent 40 minutes providing this consultation,  from 1315 to 1355. More than 50% of the time in this consultation was spent coordinating communication.   CODE STATUS:FULL  PPS: 50%  HOSPICE ELIGIBILITY/DIAGNOSIS: no  Subjective:  CHIEF COMPLAINT: constipation  HISTORY OF PRESENT ILLNESS:  Martha Butler is a 86 y.o. year old female  with anemia, constipation, OA, weight loss..   We are asked to consult around advance care planning and complex medical decision making.    Review and summarization of old Epic records shows or history from other than patient. Review or lab tests, radiology,  or medicine= Liver function labs reviewed  History obtained from review of EMR, discussion with primary team, and  interview with family, caregiver  and/or Ms. Pingley. Records reviewed and summarized above.   CURRENT PROBLEM LIST:  Patient Active Problem List   Diagnosis Date Noted  . Goals of care, counseling/discussion 09/14/2020  . Neutropenia ([place]) 09/14/2020  . Unintentional weight loss 09/14/2020  .  Anemia of chronic disease 09/14/2020  . Anemia, unspecified 10/26/2016  . Anxiety and depression 06/29/2016  . Benign essential hypertension 06/29/2016  . Caregiver stress 06/29/2016   PAST MEDICAL HISTORY:  Active Ambulatory Problems    Diagnosis Date Noted  . Anemia, unspecified 10/26/2016  . Anxiety and depression 06/29/2016   . Benign essential hypertension 06/29/2016  . Caregiver stress 06/29/2016  . Goals of care, counseling/discussion 09/14/2020  . Neutropenia ([place]) 09/14/2020  . Unintentional weight loss 09/14/2020  . Anemia of chronic disease 09/14/2020   Resolved Ambulatory Problems    Diagnosis Date Noted  . No Resolved Ambulatory Problems   Past Medical History:  Diagnosis Date  . Anemia   . Depression   . GERD (gastroesophageal reflux disease)   . Hypertension    SOCIAL HX:  Social History   Tobacco Use  . Smoking status: Never Smoker  . Smokeless tobacco: Never Used  Substance Use Topics  . Alcohol use: No   FAMILY HX: No family history on file.    ALLERGIES: No Known Allergies   PERTINENT MEDICATIONS:  Outpatient Encounter Medications as of 11/04/2020  Medication Sig  . ciprofloxacin (CIPRO) 250 MG tablet TAKE 1 TABLET BY MOUTH TWICE DAILY  . aspirin EC 81 MG tablet Take 1 tablet by mouth daily.  Marland Kitchen docusate sodium (COLACE) 100 MG capsule Take 1 capsule (100 mg total) by mouth daily. Do not take if you have loose stools  . escitalopram (LEXAPRO) 5 MG tablet Take 5 mg by mouth daily. (Patient not taking: No sig reported)  . ferrous sulfate 325 (65 FE) MG tablet Take 1 tablet (325 mg total) by mouth daily with breakfast.  . losartan (COZAAR) 50 MG tablet 50 mg daily.   No facility-administered encounter medications on file as of 11/04/2020.    Objective: ROS  General: NAD EYES: denies vision changes, no exam in years ENMT: denies dysphagia Cardiovascular: denies chest pain Pulmonary: denies cough, denies increased SOB Abdomen: endorses fair appetite, endorses constipation, endorses continence of bowel GU: denies dysuria, endorses continence of urine MSK:  endorses ROM limitations, no falls reported Skin: denies rashes or wounds Neurological: endorses weakness, endorses occ OA pain, denies insomnia Psych: Endorses positive mood Heme/lymph/immuno: denies bruises, abnormal  bleeding  Physical Exam: Current and past weights:134 lbs, Constitutional: NAD General: frail appearing, thin EYES: anicteric sclera,lids intact, no discharge  ENMT: intact hearing,oral mucous membranes moist, edentulous upper, missing teeth lower CV: S1S2, RRR, no LE edema Pulmonary: LCTA, no increased work of breathing, no cough, no audible wheezes, room air Abdomen: intake 50%, normo-active BS +  4 quadrants, soft and non tender, no ascites GU: deferred MSK: severe sarcopenia, decreased ROM in all extremities, no contractures of LE,  Ambulatory (I) Skin: warm and dry, no rashes or wounds on visible skin Neuro: Generalized weakness, mild cognitive impairment, forgetful Psych: non-anxious affect, A and O x 3 Hem/lymph/immuno: no widespread bruising   Thank you for the opportunity to participate in the care of Ms. Peeks.  The palliative care team will continue to follow. Please call our office at [phone] if we can be of additional assistance.  Jason Coop, NP , DNP, MPH, AGPCNP-BC, ACHPN  COVID-19 PATIENT SCREENING TOOL  Person answering questions: ____________self______ _____   1.  Is the patient or any family member in the home showing any signs or symptoms regarding respiratory infection?               Person with Symptom- __________NA_________________  a.  Fever                                                                          Yes___ No___          ___________________  b. Shortness of breath                                                    Yes___ No___          ___________________ c. Cough/congestion                                       Yes___  No___         ___________________ d. Body aches/pains                                                         Yes___ No___        ____________________ e. Gastrointestinal symptoms (diarrhea, nausea)           Yes___ No___        ____________________  2. Within the past 14 days, has anyone living in the home  had any contact with someone with or under investigation for COVID-19?    Yes___ No_X_   Person __________________

## 2020-11-10 ENCOUNTER — Inpatient Hospital Stay (HOSPITAL_BASED_OUTPATIENT_CLINIC_OR_DEPARTMENT_OTHER): Payer: Medicare HMO | Admitting: Oncology

## 2020-11-10 ENCOUNTER — Other Ambulatory Visit: Payer: Self-pay | Admitting: Oncology

## 2020-11-10 ENCOUNTER — Encounter: Payer: Self-pay | Admitting: Oncology

## 2020-11-10 ENCOUNTER — Inpatient Hospital Stay: Payer: Medicare HMO

## 2020-11-10 ENCOUNTER — Inpatient Hospital Stay: Payer: Medicare HMO | Attending: Oncology

## 2020-11-10 ENCOUNTER — Other Ambulatory Visit: Payer: Self-pay

## 2020-11-10 VITALS — BP 126/61 | HR 89 | Temp 97.3°F | Resp 18 | Wt 135.8 lb

## 2020-11-10 DIAGNOSIS — D72819 Decreased white blood cell count, unspecified: Secondary | ICD-10-CM | POA: Insufficient documentation

## 2020-11-10 DIAGNOSIS — D649 Anemia, unspecified: Secondary | ICD-10-CM

## 2020-11-10 DIAGNOSIS — R634 Abnormal weight loss: Secondary | ICD-10-CM | POA: Insufficient documentation

## 2020-11-10 DIAGNOSIS — D638 Anemia in other chronic diseases classified elsewhere: Secondary | ICD-10-CM

## 2020-11-10 DIAGNOSIS — D539 Nutritional anemia, unspecified: Secondary | ICD-10-CM

## 2020-11-10 DIAGNOSIS — R5383 Other fatigue: Secondary | ICD-10-CM | POA: Diagnosis not present

## 2020-11-10 DIAGNOSIS — D709 Neutropenia, unspecified: Secondary | ICD-10-CM

## 2020-11-10 LAB — COMPREHENSIVE METABOLIC PANEL
ALT: 11 U/L (ref 0–44)
AST: 17 U/L (ref 15–41)
Albumin: 4 g/dL (ref 3.5–5.0)
Alkaline Phosphatase: 45 U/L (ref 38–126)
Anion gap: 8 (ref 5–15)
BUN: 29 mg/dL — ABNORMAL HIGH (ref 8–23)
CO2: 27 mmol/L (ref 22–32)
Calcium: 8.8 mg/dL — ABNORMAL LOW (ref 8.9–10.3)
Chloride: 102 mmol/L (ref 98–111)
Creatinine, Ser: 0.99 mg/dL (ref 0.44–1.00)
GFR, Estimated: 57 mL/min — ABNORMAL LOW (ref >60)
Glucose, Bld: 165 mg/dL — ABNORMAL HIGH (ref 70–99)
Potassium: 4.3 mmol/L (ref 3.5–5.1)
Sodium: 137 mmol/L (ref 135–145)
Total Bilirubin: 0.4 mg/dL (ref 0.3–1.2)
Total Protein: 7.8 g/dL (ref 6.5–8.1)

## 2020-11-10 LAB — CBC WITH DIFFERENTIAL/PLATELET
Abs Immature Granulocytes: 0 10*3/uL (ref 0.00–0.07)
Basophils Absolute: 0.1 10*3/uL (ref 0.0–0.1)
Basophils Relative: 3 %
Eosinophils Absolute: 0.1 10*3/uL (ref 0.0–0.5)
Eosinophils Relative: 3 %
HCT: 21 % — ABNORMAL LOW (ref 36.0–46.0)
Hemoglobin: 6.7 g/dL — ABNORMAL LOW (ref 12.0–15.0)
Immature Granulocytes: 0 %
Lymphocytes Relative: 45 %
Lymphs Abs: 1.1 10*3/uL (ref 0.7–4.0)
MCH: 33.8 pg (ref 26.0–34.0)
MCHC: 31.9 g/dL (ref 30.0–36.0)
MCV: 106.1 fL — ABNORMAL HIGH (ref 80.0–100.0)
Monocytes Absolute: 0.3 10*3/uL (ref 0.1–1.0)
Monocytes Relative: 11 %
Neutro Abs: 0.9 10*3/uL — ABNORMAL LOW (ref 1.7–7.7)
Neutrophils Relative %: 38 %
Platelets: 253 10*3/uL (ref 150–400)
RBC: 1.98 MIL/uL — ABNORMAL LOW (ref 3.87–5.11)
RDW: 18.2 % — ABNORMAL HIGH (ref 11.5–15.5)
WBC: 2.4 10*3/uL — ABNORMAL LOW (ref 4.0–10.5)
nRBC: 0.8 % — ABNORMAL HIGH (ref 0.0–0.2)

## 2020-11-10 LAB — SAMPLE TO BLOOD BANK

## 2020-11-10 LAB — ABO/RH: ABO/RH(D): O POS

## 2020-11-10 MED ORDER — EPOETIN ALFA-EPBX 10000 UNIT/ML IJ SOLN
20000.0000 [IU] | Freq: Once | INTRAMUSCULAR | Status: AC
  Administered 2020-11-10: 20000 [IU] via SUBCUTANEOUS
  Filled 2020-11-10: qty 2

## 2020-11-10 MED ORDER — DOCUSATE SODIUM 100 MG PO CAPS: 100.0000 mg | ORAL_CAPSULE | Freq: Every day | ORAL | 1 refills | Status: AC

## 2020-11-10 NOTE — Progress Notes (Signed)
Hematology/Oncology Follow up note [facility] [phone] Fax:([phone]   Patient Care Team: Gladstone Lighter, MD as PCP - General (Internal Medicine)  REFERRING PROVIDER: Gladstone Lighter, MD and [place] REASON FOR VISIT Follow up for management of leukopenia and anemia.   HISTORY OF PRESENTING ILLNESS:  Martha Butler is a  86 y.o.  female with PMH listed below who was referred to me for evaluation of low white count and anemia. Patient reports that she is been feeling tired recently quite stressed.  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 02/03/2020, repeat biopsy has very limited material is considered suboptimal for evaluation. Patient does not want to get any additional bone marrow biopsy done.  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 Patient is not interested in aggressive diagnostic testing, images or treatment.  I respect patient's wish.  Recommend supportive care.   INTERVAL HISTORY Martha Butler is a 86 y.o. female who has above history reviewed by me today presents for follow up for management of chronic pancytopenia. Patient was accompanied by patient's son.  She reports feeling well at her baseline.  Appetite is so-so. Chronic fatigue, not significantly worse.  Review of Systems  Constitutional: Positive for malaise/fatigue. Negative for chills, fever and weight loss.  HENT: Negative for sore throat.   Eyes: Negative for redness.  Respiratory: Negative for cough, shortness of breath and wheezing.   Cardiovascular: Negative for chest pain, palpitations and leg swelling.  Gastrointestinal: Negative for abdominal pain, blood in stool, nausea and vomiting.  Genitourinary: Negative for dysuria.   Musculoskeletal: Negative for myalgias.  Skin: Negative for rash.  Neurological: Negative for dizziness, tingling and tremors.  Endo/Heme/Allergies: Does not bruise/bleed easily.  Psychiatric/Behavioral: Negative for hallucinations.    MEDICAL HISTORY:  Past Medical History:  Diagnosis Date  . Anemia   . Depression   . GERD (gastroesophageal reflux disease)   . Hypertension     SURGICAL HISTORY: History reviewed. No pertinent surgical history.  SOCIAL HISTORY: Social History   Socioeconomic History  . Marital status: Widowed    Spouse name: Not on file  . Number of children: 3  . Years of education: Not on file  . Highest education level: Not on file  Occupational History  . Not on file  Tobacco Use  . Smoking status: Never Smoker  . Smokeless tobacco: Never Used  Substance and Sexual Activity  . Alcohol use: No  . Drug use: No  . Sexual activity: Not on file  Other Topics Concern  . Not on file  Social History Narrative  . Not on file   Social Determinants of Health   Financial Resource Strain: Not on file  Food Insecurity: Not on file  Transportation Needs: Not on file  Physical Activity: Not on file  Stress: Not on file  Social Connections: Not on file  Intimate Partner Violence: Not on file    FAMILY HISTORY: History reviewed. No pertinent family history.  ALLERGIES:  has No Known Allergies.  MEDICATIONS:  Current Outpatient Medications  Medication Sig Dispense Refill  . aspirin EC 81 MG tablet Take 1 tablet by mouth daily.    . ciprofloxacin (CIPRO) 250 MG tablet TAKE 1 TABLET BY MOUTH TWICE DAILY 60 tablet 0  . docusate sodium (COLACE) 100 MG  capsule Take 1 capsule (100 mg total) by mouth daily. Do not take if you have loose stools 90 capsule 1  . escitalopram (LEXAPRO) 5 MG tablet Take 5 mg by mouth daily.    . ferrous sulfate 325 (65 FE) MG tablet Take 1 tablet (325 mg total) by mouth daily with breakfast. 60 tablet 3  . losartan (COZAAR) 50  MG tablet 50 mg daily.     No current facility-administered medications for this visit.     PHYSICAL EXAMINATION: ECOG PERFORMANCE STATUS: 1 - Symptomatic but completely ambulatory There were no vitals filed for this visit. There were no vitals filed for this visit.  Physical Exam Constitutional:      General: She is not in acute distress.    Appearance: She is not diaphoretic.  HENT:     Head: Normocephalic and atraumatic.     Nose: Nose normal.     Mouth/Throat:     Pharynx: No oropharyngeal exudate.  Eyes:     General: No scleral icterus.       Right eye: No discharge.        Left eye: No discharge.     Pupils: Pupils are equal, round, and reactive to light.  Neck:     Vascular: No JVD.  Cardiovascular:     Rate and Rhythm: Normal rate and regular rhythm.     Heart sounds: Murmur heard.      Comments: Systolic murmur Pulmonary:     Effort: Pulmonary effort is normal. No respiratory distress.     Breath sounds: Normal breath sounds. No wheezing or rales.  Chest:     Chest wall: No tenderness.  Abdominal:     General: Bowel sounds are normal. There is no distension.     Palpations: Abdomen is soft. There is no mass.     Tenderness: There is no abdominal tenderness. There is no rebound.  Musculoskeletal:        General: No tenderness. Normal range of motion.     Cervical back: Normal range of motion and neck supple.  Lymphadenopathy:     Cervical: No cervical adenopathy.  Skin:    General: Skin is warm and dry.     Findings: No erythema or rash.  Neurological:     Mental Status: She is alert and oriented to person, place, and time.     Cranial Nerves: No cranial nerve deficit.     Motor: No abnormal muscle tone.     Coordination: Coordination normal.  Psychiatric:        Mood and Affect: Affect normal.        Judgment: Judgment normal.      LABORATORY DATA:  I have reviewed the data as listed Lab Results  Component Value Date   WBC 1.7 (L) 10/12/2020    HGB 7.2 (L) 10/12/2020   HCT 22.9 (L) 10/12/2020   MCV 104.6 (H) 10/12/2020   PLT 235 10/12/2020   Recent Labs    01/23/20 1320 08/17/20 1320 09/13/20 1330 10/12/20 0940  NA 138 139 138 138  K 4.1 4.1 3.9 3.7  CL 105 104 103 101  CO2 25 27 29 25   GLUCOSE 130* 126* 157* 124*  BUN 15 15 14 22   CREATININE 0.99 0.79 1.04* 1.01*  CALCIUM 9.0 8.7* 9.0 8.9  GFRNONAA 53* >60 53* 55*  GFRAA >60  --   --   --   PROT 8.1 7.6 8.4* 8.1  ALBUMIN 4.2 3.8 4.3 4.1  AST 15  13* 11* 14*  ALT 8 8 8 9   ALKPHOS 49 40 41 36*  BILITOT 0.7 0.8 0.7 1.0    Normal vitamin B12, Folate, iron/TIBC, ferritin, TSH normal. No monoclonal protein. Normal LDH, TSH.   ASSESSMENT & PLAN:  1. Macrocytic anemia   2. Neutropenia, unspecified type ([place])   3. Anemia of chronic disease    #Leukopenia, predominantly neutropenia.  ANC 0.9, stable  #Anemia, hemoglobin has dropped below 7.  Suspect MDS, patient refuses bone marrow biopsy. Recommend 1 unit of PRBC transfusion.  Goal of hemoglobin is 7. Proceed with Retacrit today-anemia in chronic kidney disease. I discussed with patient and son that I suspect the patient is likely going to be transfusion dependent. Repeat H&H in 2 weeks +/- blood transfusion I will see her in 4 weeks lab in the +/- blood transfusion +/- Retacrit #Weight loss, she has lost about 15 to 20 pounds since last year.  CT abdomen pelvis showed no acute abdominal/pelvic findings.  Chest x-ray showed no acute abnormality #Constipation,   Earlie Server, MD, PhD Hematology Oncology [place] [facility] 11/10/2020

## 2020-11-10 NOTE — Progress Notes (Signed)
Pt here for follow up. No new concerns voiced.   

## 2020-11-11 ENCOUNTER — Other Ambulatory Visit: Payer: Medicaid Other | Admitting: Primary Care

## 2020-11-12 ENCOUNTER — Inpatient Hospital Stay: Payer: Medicare HMO

## 2020-11-12 DIAGNOSIS — D649 Anemia, unspecified: Secondary | ICD-10-CM

## 2020-11-12 DIAGNOSIS — D72819 Decreased white blood cell count, unspecified: Secondary | ICD-10-CM | POA: Diagnosis not present

## 2020-11-12 DIAGNOSIS — R634 Abnormal weight loss: Secondary | ICD-10-CM | POA: Diagnosis not present

## 2020-11-12 DIAGNOSIS — R5383 Other fatigue: Secondary | ICD-10-CM | POA: Diagnosis not present

## 2020-11-12 LAB — TYPE AND SCREEN
ABO/RH(D): O POS
Antibody Screen: NEGATIVE

## 2020-11-12 MED ORDER — ACETAMINOPHEN 325 MG PO TABS
650.0000 mg | ORAL_TABLET | Freq: Once | ORAL | Status: AC
  Administered 2020-11-12: 650 mg via ORAL
  Filled 2020-11-12: qty 2

## 2020-11-12 MED ORDER — SODIUM CHLORIDE 0.9% IV SOLUTION
250.0000 mL | Freq: Once | INTRAVENOUS | Status: AC
  Administered 2020-11-12: 250 mL via INTRAVENOUS
  Filled 2020-11-12: qty 250

## 2020-11-12 MED ORDER — DIPHENHYDRAMINE HCL 25 MG PO CAPS
25.0000 mg | ORAL_CAPSULE | Freq: Once | ORAL | Status: AC
  Administered 2020-11-12: 25 mg via ORAL
  Filled 2020-11-12: qty 1

## 2020-11-12 NOTE — Progress Notes (Signed)
Patient received 1 unit PRBC in clinic today. Tolerated well. Patient stable at discharge. 

## 2020-11-13 LAB — BPAM RBC
Blood Product Expiration Date: 202202142359
ISSUE DATE / TIME: 202201141144
Unit Type and Rh: 5100

## 2020-11-13 LAB — TYPE AND SCREEN
ABO/RH(D): O POS
Antibody Screen: NEGATIVE
Unit division: 0

## 2020-11-15 LAB — PREPARE RBC (CROSSMATCH)

## 2020-11-18 ENCOUNTER — Inpatient Hospital Stay: Payer: Medicare HMO

## 2020-11-18 NOTE — Progress Notes (Signed)
Nutrition  Patient was a no show for nutrition appointment today at 3:15pm.  Message sent to scheduling to offer another follow-up appointment.   Analina Filla B. Freida Busman, RD, LDN Registered Dietitian [phone] (mobile)

## 2020-11-22 ENCOUNTER — Other Ambulatory Visit: Payer: Self-pay

## 2020-11-22 ENCOUNTER — Telehealth: Payer: Self-pay | Admitting: Oncology

## 2020-11-22 ENCOUNTER — Emergency Department: Payer: Medicare HMO

## 2020-11-22 ENCOUNTER — Inpatient Hospital Stay
Admission: EM | Admit: 2020-11-22 | Discharge: 2020-11-28 | DRG: 177 | Disposition: A | Discharge status: Home / Self Care | Payer: Medicare HMO | Attending: Internal Medicine | Admitting: Internal Medicine

## 2020-11-22 DIAGNOSIS — N179 Acute kidney failure, unspecified: Secondary | ICD-10-CM | POA: Diagnosis not present

## 2020-11-22 DIAGNOSIS — I081 Rheumatic disorders of both mitral and tricuspid valves: Secondary | ICD-10-CM | POA: Diagnosis present

## 2020-11-22 DIAGNOSIS — I471 Supraventricular tachycardia, unspecified: Secondary | ICD-10-CM

## 2020-11-22 DIAGNOSIS — E870 Hyperosmolality and hypernatremia: Secondary | ICD-10-CM | POA: Diagnosis present

## 2020-11-22 DIAGNOSIS — R9431 Abnormal electrocardiogram [ECG] [EKG]: Secondary | ICD-10-CM | POA: Diagnosis not present

## 2020-11-22 DIAGNOSIS — Z79899 Other long term (current) drug therapy: Secondary | ICD-10-CM

## 2020-11-22 DIAGNOSIS — I10 Essential (primary) hypertension: Secondary | ICD-10-CM | POA: Diagnosis present

## 2020-11-22 DIAGNOSIS — K219 Gastro-esophageal reflux disease without esophagitis: Secondary | ICD-10-CM | POA: Diagnosis present

## 2020-11-22 DIAGNOSIS — J9601 Acute respiratory failure with hypoxia: Secondary | ICD-10-CM | POA: Diagnosis present

## 2020-11-22 DIAGNOSIS — A419 Sepsis, unspecified organism: Secondary | ICD-10-CM | POA: Diagnosis not present

## 2020-11-22 DIAGNOSIS — I959 Hypotension, unspecified: Secondary | ICD-10-CM | POA: Diagnosis not present

## 2020-11-22 DIAGNOSIS — R531 Weakness: Secondary | ICD-10-CM | POA: Diagnosis not present

## 2020-11-22 DIAGNOSIS — R627 Adult failure to thrive: Secondary | ICD-10-CM | POA: Diagnosis present

## 2020-11-22 DIAGNOSIS — E860 Dehydration: Secondary | ICD-10-CM | POA: Diagnosis present

## 2020-11-22 DIAGNOSIS — Z7984 Long term (current) use of oral hypoglycemic drugs: Secondary | ICD-10-CM | POA: Diagnosis not present

## 2020-11-22 DIAGNOSIS — Z6820 Body mass index (BMI) 20.0-20.9, adult: Secondary | ICD-10-CM

## 2020-11-22 DIAGNOSIS — E43 Unspecified severe protein-calorie malnutrition: Secondary | ICD-10-CM | POA: Diagnosis present

## 2020-11-22 DIAGNOSIS — D709 Neutropenia, unspecified: Secondary | ICD-10-CM | POA: Diagnosis present

## 2020-11-22 DIAGNOSIS — R0602 Shortness of breath: Secondary | ICD-10-CM

## 2020-11-22 DIAGNOSIS — R21 Rash and other nonspecific skin eruption: Secondary | ICD-10-CM | POA: Diagnosis not present

## 2020-11-22 DIAGNOSIS — E440 Moderate protein-calorie malnutrition: Secondary | ICD-10-CM | POA: Diagnosis not present

## 2020-11-22 DIAGNOSIS — R0902 Hypoxemia: Secondary | ICD-10-CM | POA: Diagnosis not present

## 2020-11-22 DIAGNOSIS — U071 COVID-19: Secondary | ICD-10-CM | POA: Diagnosis not present

## 2020-11-22 DIAGNOSIS — J189 Pneumonia, unspecified organism: Secondary | ICD-10-CM | POA: Diagnosis not present

## 2020-11-22 DIAGNOSIS — I38 Endocarditis, valve unspecified: Secondary | ICD-10-CM | POA: Diagnosis not present

## 2020-11-22 DIAGNOSIS — J1282 Pneumonia due to coronavirus disease 2019: Principal | ICD-10-CM | POA: Diagnosis present

## 2020-11-22 DIAGNOSIS — I340 Nonrheumatic mitral (valve) insufficiency: Secondary | ICD-10-CM | POA: Diagnosis not present

## 2020-11-22 DIAGNOSIS — E1165 Type 2 diabetes mellitus with hyperglycemia: Secondary | ICD-10-CM | POA: Diagnosis not present

## 2020-11-22 DIAGNOSIS — I517 Cardiomegaly: Secondary | ICD-10-CM | POA: Diagnosis not present

## 2020-11-22 DIAGNOSIS — R069 Unspecified abnormalities of breathing: Secondary | ICD-10-CM | POA: Diagnosis not present

## 2020-11-22 DIAGNOSIS — D649 Anemia, unspecified: Secondary | ICD-10-CM | POA: Diagnosis not present

## 2020-11-22 DIAGNOSIS — I4891 Unspecified atrial fibrillation: Secondary | ICD-10-CM | POA: Diagnosis not present

## 2020-11-22 DIAGNOSIS — Z7982 Long term (current) use of aspirin: Secondary | ICD-10-CM | POA: Diagnosis not present

## 2020-11-22 DIAGNOSIS — E119 Type 2 diabetes mellitus without complications: Secondary | ICD-10-CM | POA: Diagnosis present

## 2020-11-22 DIAGNOSIS — R5381 Other malaise: Secondary | ICD-10-CM | POA: Diagnosis present

## 2020-11-22 DIAGNOSIS — I361 Nonrheumatic tricuspid (valve) insufficiency: Secondary | ICD-10-CM | POA: Diagnosis not present

## 2020-11-22 DIAGNOSIS — R0689 Other abnormalities of breathing: Secondary | ICD-10-CM | POA: Diagnosis not present

## 2020-11-22 DIAGNOSIS — R600 Localized edema: Secondary | ICD-10-CM | POA: Diagnosis not present

## 2020-11-22 DIAGNOSIS — N170 Acute kidney failure with tubular necrosis: Secondary | ICD-10-CM | POA: Diagnosis not present

## 2020-11-22 DIAGNOSIS — F32A Depression, unspecified: Secondary | ICD-10-CM | POA: Diagnosis present

## 2020-11-22 DIAGNOSIS — R5383 Other fatigue: Secondary | ICD-10-CM | POA: Diagnosis not present

## 2020-11-22 HISTORY — DX: Nutritional anemia, unspecified: D53.9

## 2020-11-22 HISTORY — DX: Decreased white blood cell count, unspecified: D72.819

## 2020-11-22 LAB — BASIC METABOLIC PANEL
Anion gap: 13 (ref 5–15)
BUN: 59 mg/dL — ABNORMAL HIGH (ref 8–23)
CO2: 26 mmol/L (ref 22–32)
Calcium: 8.3 mg/dL — ABNORMAL LOW (ref 8.9–10.3)
Chloride: 112 mmol/L — ABNORMAL HIGH (ref 98–111)
Creatinine, Ser: 2.06 mg/dL — ABNORMAL HIGH (ref 0.44–1.00)
GFR, Estimated: 23 mL/min — ABNORMAL LOW (ref >60)
Glucose, Bld: 214 mg/dL — ABNORMAL HIGH (ref 70–99)
Potassium: 4.5 mmol/L (ref 3.5–5.1)
Sodium: 151 mmol/L — ABNORMAL HIGH (ref 135–145)

## 2020-11-22 LAB — CBC WITH DIFFERENTIAL/PLATELET
Abs Immature Granulocytes: 0 10*3/uL (ref 0.00–0.07)
Basophils Absolute: 0 10*3/uL (ref 0.0–0.1)
Basophils Relative: 0 %
Eosinophils Absolute: 0 10*3/uL (ref 0.0–0.5)
Eosinophils Relative: 0 %
HCT: 30.9 % — ABNORMAL LOW (ref 36.0–46.0)
Hemoglobin: 9.6 g/dL — ABNORMAL LOW (ref 12.0–15.0)
Lymphocytes Relative: 24 %
Lymphs Abs: 0.4 10*3/uL — ABNORMAL LOW (ref 0.7–4.0)
MCH: 32.9 pg (ref 26.0–34.0)
MCHC: 31.1 g/dL (ref 30.0–36.0)
MCV: 105.8 fL — ABNORMAL HIGH (ref 80.0–100.0)
Monocytes Absolute: 0 10*3/uL — ABNORMAL LOW (ref 0.1–1.0)
Monocytes Relative: 2 %
Neutro Abs: 1.2 10*3/uL — ABNORMAL LOW (ref 1.7–7.7)
Neutrophils Relative %: 74 %
Platelets: 267 10*3/uL (ref 150–400)
RBC: 2.92 MIL/uL — ABNORMAL LOW (ref 3.87–5.11)
RDW: 16.7 % — ABNORMAL HIGH (ref 11.5–15.5)
WBC: 1.6 10*3/uL — ABNORMAL LOW (ref 4.0–10.5)
nRBC: 1.9 % — ABNORMAL HIGH (ref 0.0–0.2)

## 2020-11-22 LAB — PROCALCITONIN: Procalcitonin: 0.61 ng/mL

## 2020-11-22 LAB — POC SARS CORONAVIRUS 2 AG -  ED: SARS Coronavirus 2 Ag: POSITIVE — AB

## 2020-11-22 LAB — CBC
HCT: 31.4 % — ABNORMAL LOW (ref 36.0–46.0)
Hemoglobin: 9.7 g/dL — ABNORMAL LOW (ref 12.0–15.0)
MCH: 32.7 pg (ref 26.0–34.0)
MCHC: 30.9 g/dL (ref 30.0–36.0)
MCV: 105.7 fL — ABNORMAL HIGH (ref 80.0–100.0)
Platelets: 269 10*3/uL (ref 150–400)
RBC: 2.97 MIL/uL — ABNORMAL LOW (ref 3.87–5.11)
RDW: 16.8 % — ABNORMAL HIGH (ref 11.5–15.5)
WBC: 1.5 10*3/uL — ABNORMAL LOW (ref 4.0–10.5)
nRBC: 1.3 % — ABNORMAL HIGH (ref 0.0–0.2)

## 2020-11-22 LAB — HEPATIC FUNCTION PANEL
ALT: 31 U/L (ref 0–44)
AST: 75 U/L — ABNORMAL HIGH (ref 15–41)
Albumin: 3.1 g/dL — ABNORMAL LOW (ref 3.5–5.0)
Alkaline Phosphatase: 55 U/L (ref 38–126)
Bilirubin, Direct: <0.1 mg/dL (ref 0.0–0.2)
Total Bilirubin: 0.7 mg/dL (ref 0.3–1.2)
Total Protein: 8.5 g/dL — ABNORMAL HIGH (ref 6.5–8.1)

## 2020-11-22 LAB — APTT: aPTT: 34 seconds (ref 24–36)

## 2020-11-22 LAB — PROTIME-INR
INR: 1.2 (ref 0.8–1.2)
Prothrombin Time: 14.8 seconds (ref 11.4–15.2)

## 2020-11-22 LAB — LACTIC ACID, PLASMA
Lactic Acid, Venous: 3 mmol/L (ref 0.5–1.9)
Lactic Acid, Venous: 1.6 mmol/L (ref 0.5–1.9)

## 2020-11-22 LAB — MAGNESIUM: Magnesium: 2.7 mg/dL — ABNORMAL HIGH (ref 1.7–2.4)

## 2020-11-22 MED ORDER — CALCIUM GLUCONATE-NACL 1-0.675 GM/50ML-% IV SOLN
1.0000 g | Freq: Once | INTRAVENOUS | Status: DC
  Filled 2020-11-22: qty 50

## 2020-11-22 MED ORDER — ADENOSINE 12 MG/4ML IV SOLN
INTRAVENOUS | Status: AC
  Filled 2020-11-22: qty 4

## 2020-11-22 MED ORDER — ACETAMINOPHEN 325 MG PO TABS: 650.0000 mg | ORAL_TABLET | Freq: Four times a day (QID) | ORAL | Status: DC | PRN

## 2020-11-22 MED ORDER — DILTIAZEM HCL-DEXTROSE 125-5 MG/125ML-% IV SOLN (PREMIX)
5.0000 mg/h | INTRAVENOUS | Status: DC
  Filled 2020-11-22: qty 125

## 2020-11-22 MED ORDER — SODIUM CHLORIDE 0.45 % IV SOLN: INTRAVENOUS | Status: AC

## 2020-11-22 MED ORDER — SODIUM CHLORIDE 0.9 % IV BOLUS (SEPSIS)
1000.0000 mL | Freq: Once | INTRAVENOUS | Status: AC
  Administered 2020-11-22: 1000 mL via INTRAVENOUS

## 2020-11-22 MED ORDER — SODIUM CHLORIDE 0.9 % IV SOLN
100.0000 mg | Freq: Every day | INTRAVENOUS | Status: DC
  Administered 2020-11-23: 100 mg via INTRAVENOUS
  Filled 2020-11-22: qty 20

## 2020-11-22 MED ORDER — ESCITALOPRAM OXALATE 10 MG PO TABS
5.0000 mg | ORAL_TABLET | Freq: Every day | ORAL | Status: DC
  Administered 2020-11-23 – 2020-11-28 (×5): 5 mg via ORAL
  Filled 2020-11-22 (×3): qty 0.5
  Filled 2020-11-22 (×2): qty 1
  Filled 2020-11-22: qty 0.5

## 2020-11-22 MED ORDER — DOCUSATE SODIUM 100 MG PO CAPS
100.0000 mg | ORAL_CAPSULE | Freq: Every day | ORAL | Status: DC
  Administered 2020-11-23 – 2020-11-28 (×4): 100 mg via ORAL
  Filled 2020-11-22 (×5): qty 1

## 2020-11-22 MED ORDER — DEXAMETHASONE SODIUM PHOSPHATE 10 MG/ML IJ SOLN: 6.0000 mg | INTRAMUSCULAR | Status: DC

## 2020-11-22 MED ORDER — ASPIRIN EC 81 MG PO TBEC
81.0000 mg | DELAYED_RELEASE_TABLET | Freq: Every day | ORAL | Status: DC
  Administered 2020-11-23 – 2020-11-28 (×5): 81 mg via ORAL
  Filled 2020-11-22 (×6): qty 1

## 2020-11-22 MED ORDER — ENOXAPARIN SODIUM 30 MG/0.3ML ~~LOC~~ SOLN
30.0000 mg | SUBCUTANEOUS | Status: DC
  Administered 2020-11-23 – 2020-11-24 (×3): 30 mg via SUBCUTANEOUS
  Filled 2020-11-22 (×4): qty 0.3

## 2020-11-22 MED ORDER — AMIODARONE HCL IN DEXTROSE 360-4.14 MG/200ML-% IV SOLN
30.0000 mg/h | INTRAVENOUS | Status: DC
  Administered 2020-11-23 – 2020-11-26 (×7): 30 mg/h via INTRAVENOUS
  Filled 2020-11-22 (×8): qty 200

## 2020-11-22 MED ORDER — SENNOSIDES-DOCUSATE SODIUM 8.6-50 MG PO TABS: 1.0000 | ORAL_TABLET | Freq: Every evening | ORAL | Status: DC | PRN

## 2020-11-22 MED ORDER — ADENOSINE 6 MG/2ML IV SOLN
INTRAVENOUS | Status: AC
  Filled 2020-11-22: qty 2

## 2020-11-22 MED ORDER — SODIUM CHLORIDE 0.9 % IV SOLN
2.0000 g | INTRAVENOUS | Status: DC
  Administered 2020-11-22 – 2020-11-24 (×3): 2 g via INTRAVENOUS
  Filled 2020-11-22 (×2): qty 20
  Filled 2020-11-22: qty 2
  Filled 2020-11-22: qty 20

## 2020-11-22 MED ORDER — ENOXAPARIN SODIUM 40 MG/0.4ML ~~LOC~~ SOLN: 40.0000 mg | SUBCUTANEOUS | Status: DC

## 2020-11-22 MED ORDER — SODIUM CHLORIDE 0.9 % IV SOLN
200.0000 mg | Freq: Once | INTRAVENOUS | Status: DC
  Filled 2020-11-22: qty 40

## 2020-11-22 MED ORDER — SODIUM CHLORIDE 0.9 % IV SOLN
500.0000 mg | INTRAVENOUS | Status: DC
  Administered 2020-11-22 – 2020-11-24 (×3): 500 mg via INTRAVENOUS
  Filled 2020-11-22 (×4): qty 500

## 2020-11-22 MED ORDER — DEXAMETHASONE SODIUM PHOSPHATE 10 MG/ML IJ SOLN
10.0000 mg | Freq: Once | INTRAMUSCULAR | Status: AC
  Administered 2020-11-22: 10 mg via INTRAVENOUS
  Filled 2020-11-22: qty 1

## 2020-11-22 MED ORDER — AMIODARONE HCL IN DEXTROSE 360-4.14 MG/200ML-% IV SOLN
INTRAVENOUS | Status: AC
  Administered 2020-11-22: 60 mg/h via INTRAVENOUS
  Filled 2020-11-22: qty 200

## 2020-11-22 MED ORDER — FOLIC ACID 1 MG PO TABS
1.0000 mg | ORAL_TABLET | Freq: Every day | ORAL | Status: DC
  Administered 2020-11-23 – 2020-11-28 (×5): 1 mg via ORAL
  Filled 2020-11-22 (×6): qty 1

## 2020-11-22 MED ORDER — AMIODARONE HCL IN DEXTROSE 360-4.14 MG/200ML-% IV SOLN: 60.0000 mg/h | INTRAVENOUS | Status: AC

## 2020-11-22 NOTE — ED Notes (Signed)
2 sets blood cultures obtained and sent to lab prior to starting fluids and rocephin.

## 2020-11-22 NOTE — ED Notes (Signed)
Multiple attempts made to place 2nd PIV. Will place IV team consult at this time. MD aware.

## 2020-11-22 NOTE — H&P (Signed)
Chief Complaint:  HPI:  Patient is a poor historian.  History was mostly obtained from EMR and from ED physician.  Martha Butler is an 86 y.o. female with history of chronic macrocytic anemia, neutropenia, hypertension and GERD.  At baseline, patient lives by self and is checked on daily by son.  Attempts to reach son at this time has been unsuccessful.  As per patient, over the past few days, she has felt extremely weak and fatigued with minimal exertion.  She also complained of shortness of breath.  On that account, son convinced her to come to the emergency room to be the further evaluated today.  She admitted to poor appetite and being unable to eat over the past several days.  On presentation, patient was noted to be alert oriented x4.  Vital signs showed hypotension with tachycardia.  She denied any associated chest pain or palpitations.  EKG was noted to have a rate of 166 bpm.  EKG was done in the ED with findings suggestive of SVT.  6 mg adenosine was offered in the ED with a brief conversion to sinus rhythm.  She rebounded and required another dose.  Blood pressure still remains labile has was not a candidate for Cardizem drip.  Patient is being considered for amiodarone drip.  She is intravascularly depleted and will be challenged with IV fluids.    Past Medical History:  Diagnosis Date  . Anemia   . Depression   . GERD (gastroesophageal reflux disease)   . Hypertension     History reviewed. No pertinent surgical history.  No family history on file. Social History:  reports that she has never smoked. She has never used smokeless tobacco. She reports that she does not drink alcohol and does not use drugs.  Allergies: No Known Allergies  (Not in a hospital admission)   Results for orders placed or performed during the hospital encounter of 11/22/20 (from the past 48 hour(s))  Basic metabolic panel     Status: Abnormal   Collection Time: 11/22/20  3:57 PM  Result Value Ref  Range   Sodium 151 (H) 135 - 145 mmol/L   Potassium 4.5 3.5 - 5.1 mmol/L   Chloride 112 (H) 98 - 111 mmol/L   CO2 26 22 - 32 mmol/L   Glucose, Bld 214 (H) 70 - 99 mg/dL    Comment: Glucose reference range applies only to samples taken after fasting for at least 8 hours.   BUN 59 (H) 8 - 23 mg/dL   Creatinine, Ser 2.06 (H) 0.44 - 1.00 mg/dL   Calcium 8.3 (L) 8.9 - 10.3 mg/dL   GFR, Estimated 23 (L) >60 mL/min    Comment: (NOTE) Calculated using the CKD-EPI Creatinine Equation (2021)    Anion gap 13 5 - 15    Comment: Performed at [facility], [address] [place], [place] [address]  CBC     Status: Abnormal   Collection Time: 11/22/20  3:57 PM  Result Value Ref Range   WBC 1.5 (L) 4.0 - 10.5 K/uL   RBC 2.97 (L) 3.87 - 5.11 MIL/uL   Hemoglobin 9.7 (L) 12.0 - 15.0 g/dL   HCT 31.4 (L) 36.0 - 46.0 %   MCV 105.7 (H) 80.0 - 100.0 fL   MCH 32.7 26.0 - 34.0 pg   MCHC 30.9 30.0 - 36.0 g/dL   RDW 16.8 (H) 11.5 - 15.5 %   Platelets 269 150 - 400 K/uL   nRBC 1.3 (H) 0.0 -  0.2 %    Comment: Performed at [facility], [address] [place], [place] [address]  CBC with Differential/Platelet     Status: Abnormal   Collection Time: 11/22/20  3:57 PM  Result Value Ref Range   WBC 1.6 (L) 4.0 - 10.5 K/uL   RBC 2.92 (L) 3.87 - 5.11 MIL/uL   Hemoglobin 9.6 (L) 12.0 - 15.0 g/dL   HCT 30.9 (L) 36.0 - 46.0 %   MCV 105.8 (H) 80.0 - 100.0 fL   MCH 32.9 26.0 - 34.0 pg   MCHC 31.1 30.0 - 36.0 g/dL   RDW 16.7 (H) 11.5 - 15.5 %   Platelets 267 150 - 400 K/uL   nRBC 1.9 (H) 0.0 - 0.2 %   Neutrophils Relative % 74 %   Neutro Abs 1.2 (L) 1.7 - 7.7 K/uL   Lymphocytes Relative 24 %   Lymphs Abs 0.4 (L) 0.7 - 4.0 K/uL   Monocytes Relative 2 %   Monocytes Absolute 0.0 (L) 0.1 - 1.0 K/uL   Eosinophils Relative 0 %   Eosinophils Absolute 0.0 0.0 - 0.5 K/uL   Basophils Relative 0 %   Basophils Absolute 0.0 0.0 - 0.1 K/uL   Abs Immature Granulocytes 0.00 0.00 - 0.07 K/uL    Spherocytes PRESENT     Comment: Performed at [facility], [address] [place], [place] [address]  Lactic acid, plasma     Status: Abnormal   Collection Time: 11/22/20  4:36 PM  Result Value Ref Range   Lactic Acid, Venous 3.0 (HH) 0.5 - 1.9 mmol/L    Comment: CRITICAL RESULT CALLED TO, READ BACK BY AND VERIFIED WITH ISABELLA LAPIETRA @1722  11/22/20 MJU Performed at [facility], [address] [place], [place] [address]   Protime-INR     Status: None   Collection Time: 11/22/20  4:36 PM  Result Value Ref Range   Prothrombin Time 14.8 11.4 - 15.2 seconds   INR 1.2 0.8 - 1.2    Comment: (NOTE) INR goal varies based on device and disease states. Performed at [facility], [address] [place], [place] [address]   APTT     Status: None   Collection Time: 11/22/20  4:36 PM  Result Value Ref Range   aPTT 34 24 - 36 seconds    Comment: Performed at [facility], [address] [place], [place] [address]  Hepatic function panel     Status: Abnormal   Collection Time: 11/22/20  4:36 PM  Result Value Ref Range   Total Protein 8.5 (H) 6.5 - 8.1 g/dL   Albumin 3.1 (L) 3.5 - 5.0 g/dL   AST 75 (H) 15 - 41 U/L   ALT 31 0 - 44 U/L   Alkaline Phosphatase 55 38 - 126 U/L   Total Bilirubin 0.7 0.3 - 1.2 mg/dL   Bilirubin, Direct <0.1 0.0 - 0.2 mg/dL   Indirect Bilirubin NOT CALCULATED 0.3 - 0.9 mg/dL    Comment: Performed at [facility], [place],  [address]  Procalcitonin - Baseline     Status: None   Collection Time: 11/22/20  4:36 PM  Result Value Ref Range   Procalcitonin 0.61 ng/mL    Comment:        Interpretation: PCT > 0.5 ng/mL and <= 2 ng/mL: Systemic infection (sepsis) is possible, but other conditions are known to elevate PCT as well. (NOTE)       Sepsis PCT Algorithm  Lower Respiratory Tract                                      Infection PCT Algorithm    ----------------------------      ----------------------------         PCT < 0.25 ng/mL                PCT < 0.10 ng/mL          Strongly encourage             Strongly discourage   discontinuation of antibiotics    initiation of antibiotics    ----------------------------     -----------------------------       PCT 0.25 - 0.50 ng/mL            PCT 0.10 - 0.25 ng/mL               OR       >80% decrease in PCT            Discourage initiation of                                            antibiotics      Encourage discontinuation           of antibiotics    ----------------------------     -----------------------------         PCT >= 0.50 ng/mL              PCT 0.26 - 0.50 ng/mL                AND       <80% decrease in PCT             Encourage initiation of                                             antibiotics       Encourage continuation           of antibiotics    ----------------------------     -----------------------------        PCT >= 0.50 ng/mL                  PCT > 0.50 ng/mL               AND         increase in PCT                  Strongly encourage                                      initiation of antibiotics    Strongly encourage escalation           of antibiotics                                     -----------------------------  PCT <= 0.25 ng/mL                                                 OR                                        > 80% decrease in PCT                                      Discontinue / Do not initiate                                             antibiotics  Performed at [facility], [address] [place], [place] [address]   POC SARS Coronavirus 2 Ag-ED - Nasal Swab (BD Veritor Kit)     Status: Abnormal   Collection Time: 11/22/20  4:38 PM  Result Value Ref Range   SARS Coronavirus 2 Ag Positive (A) Negative  Lactic acid, plasma     Status: None   Collection Time: 11/22/20  6:36 PM  Result Value Ref Range    Lactic Acid, Venous 1.6 0.5 - 1.9 mmol/L    Comment: Performed at [facility], [address], [place], [place] [address]  Magnesium     Status: Abnormal   Collection Time: 11/22/20  9:00 PM  Result Value Ref Range   Magnesium 2.7 (H) 1.7 - 2.4 mg/dL    Comment: Performed at [facility], [address] [place], [place] [address]   DG Chest Abram 1 View  Result Date: 11/22/2020 CLINICAL DATA:  86 year old female with concern for sepsis. EXAM: PORTABLE CHEST 1 VIEW COMPARISON:  Chest radiograph dated 10/12/2020. FINDINGS: Bilateral streaky densities most consistent with pneumonia, possibly viral or atypical in etiology including COVID-19. Clinical correlation is recommended. No pleural effusion or pneumothorax. There is mild eventration of the left hemidiaphragm. Stable cardiac silhouette. No acute osseous pathology. IMPRESSION: Multifocal pneumonia. Electronically Signed   By: Anner Crete M.D.   On: 11/22/2020 17:04    Review of Systems  Constitutional: Positive for fatigue.  HENT: Negative.   Eyes: Negative.   Respiratory: Positive for shortness of breath.   Cardiovascular: Negative for chest pain, palpitations and leg swelling.  Genitourinary: Negative.   Musculoskeletal: Negative.   Neurological: Negative.   Psychiatric/Behavioral: Negative.     Blood pressure 91/79, pulse 84, temperature 97.6 F (36.4 C), resp. rate (!) 27, SpO2 100 %.PR was 166. Physical Exam Constitutional:      General: She is not in acute distress.    Appearance: She is not ill-appearing.  HENT:     Head: Atraumatic.     Mouth/Throat:     Pharynx: Oropharynx is clear.  Eyes:     Pupils: Pupils are equal, round, and reactive to light.  Cardiovascular:     Rate and Rhythm: Tachycardia present. Rhythm irregular.     Pulses: Decreased pulses.  Pulmonary:     Effort: Pulmonary effort is normal.     Breath sounds: Normal breath sounds.  Abdominal:  General: Bowel sounds are  normal.     Palpations: Abdomen is soft.  Musculoskeletal:        General: Normal range of motion.  Skin:    General: Skin is warm and dry.  Neurological:     General: No focal deficit present.     Mental Status: She is alert and oriented to person, place, and time.  Psychiatric:        Behavior: Behavior normal.      Assessment/Plan Narrow complex ventricular tachycardia- Did not respond to Adenosine pushes X2.  Due to extremely labile blood pressures, will avoid Cardizem and consider patient on amiodarone.  This has already been initiated in the ED.  Will monitor patient's heart rate and blood pressure as per protocol.  Echocardiogram has been ordered for a.m.  Cardiology consulted to eval and advice further management.  Patient may benefit from chronic anticoagulation based on her chadsvasc score.  TSH and free T4 levels pending.  Hypotension: Most likely cardiogenic in etiology from narrow complex tachycardia with RVR.  Trial of IV fluid boluses will be offered.  Hold antihypertensive medication.  We will continue to monitor patient.  Patient will be admitted to stepdown unit for advanced care monitoring.  COVID-19 pneumonia: Asymptomatic.  Multifocal pneumonia, more prominent on the right lower lobe compared to left.  Patient however not on any oxygen.  Patient empirically being covered with IV antibiotics with Rocephin and azithromycin.  Less likely aspiration pneumonia.  Patient was also initiated on Decadron due to lung findings. Will consider holding remdesivir at this time as patient has mild symptoms and saturating well on room air.   Acute renal failure: Present on admission.Likely due todehydration versus ATN from hypotension. Patient will be adequately volume repleted.  Avoid nephrotoxic medications.  Hypernatremia: Most likely to poor oral and free water intake.  Patient will follow repleted with hypotonic saline solution.  Monitor BMP daily with sodium levels.  Encourage free  water intake.  Patient is moderate risk due to age and comorbidities.  [place], MD 11/22/2020, 9:24 PM

## 2020-11-22 NOTE — ED Notes (Signed)
Son Martha Butler updated at this time.

## 2020-11-22 NOTE — Telephone Encounter (Signed)
Please call pt's son and see if they would like to r/s as appt is for lab/poss blood transfusion.

## 2020-11-22 NOTE — Consult Note (Signed)
CODE SEPSIS - PHARMACY COMMUNICATION  **Broad Spectrum Antibiotics should be administered within 1 hour of Sepsis diagnosis**  Time Code Sepsis Called/Page Received: 1636  Antibiotics Ordered: Ceftriaxone 2g IV q24h  Time of 1st antibiotic administration: 1650   Raiford Noble, PharmD Pharmacy Resident  11/22/2020 5:00 PM

## 2020-11-22 NOTE — Progress Notes (Signed)
Remdesivir - Pharmacy Brief Note   O:  ALT: 11 CXR: pending SpO2: 99% on RA   A/P:  Remdesivir 200 mg IVPB once followed by 100 mg IVPB daily x 4 days.   Laureen Ochs, PharmD 11/22/2020 4:59 PM

## 2020-11-22 NOTE — ED Triage Notes (Signed)
Pt comes via EMS from home with c/o weakness and SOB for last few days. Pt states no CP. Pt states she hasn't been able to eat anything recently.

## 2020-11-22 NOTE — Progress Notes (Signed)
Following for code sepsis 

## 2020-11-22 NOTE — ED Provider Notes (Signed)
[facility] Emergency Department Provider Note  ____________________________________________  Time seen: Approximately 4:41 PM  I have reviewed the triage vital signs and the nursing notes.   HISTORY  Chief Complaint Weakness and Shortness of Breath    HPI Martha Butler is a 86 y.o. female with a history of hypertension, GERD, anemia who comes ED complaining of generalized weakness and fatigue and shortness of breath for the past several days.  She is not able to pinpoint an exact onset.  Also has loss of appetite and poor oral intake.  No chest pain.  No syncope or dizziness or falls.  Symptoms have been constant, worsening gradually, no aggravating or alleviating factors.  She lives home alone, her son checks on her every day and felt like she needed to come to the hospital today.      Past Medical History:  Diagnosis Date  . Anemia   . Depression   . GERD (gastroesophageal reflux disease)   . Hypertension      Patient Active Problem List   Diagnosis Date Noted  . Goals of care, counseling/discussion 09/14/2020  . Neutropenia (HCC) 09/14/2020  . Unintentional weight loss 09/14/2020  . Anemia of chronic disease 09/14/2020  . Anemia, unspecified 10/26/2016  . Anxiety and depression 06/29/2016  . Benign essential hypertension 06/29/2016  . Caregiver stress 06/29/2016     History reviewed. No pertinent surgical history.   Prior to Admission medications   Medication Sig Start Date End Date Taking? Authorizing Provider  aspirin EC 81 MG tablet Take 1 tablet by mouth daily.    [provider]  ciprofloxacin (CIPRO) 250 MG tablet TAKE 1 TABLET BY MOUTH TWICE DAILY 11/03/20   Rickard Patience, MD  docusate sodium (COLACE) 100 MG capsule Take 1 capsule (100 mg total) by mouth daily. Do not take if you have loose stools 11/10/20   Rickard Patience, MD  escitalopram (LEXAPRO) 5 MG tablet Take 5 mg by mouth daily. 06/26/18   [provider]  losartan  (COZAAR) 50 MG tablet 50 mg daily. 02/25/18   [provider]     Allergies Patient has no known allergies.   No family history on file.  Social History Social History   Tobacco Use  . Smoking status: Never Smoker  . Smokeless tobacco: Never Used  Substance Use Topics  . Alcohol use: No  . Drug use: No    Review of Systems  Constitutional:   No fever or chills.  Positive fatigue ENT:   No sore throat. No rhinorrhea. Cardiovascular:   No chest pain or syncope. Respiratory: Positive shortness of breath and nonproductive cough. Gastrointestinal:   Negative for abdominal pain, vomiting and diarrhea.  Musculoskeletal:   Negative for focal pain or swelling All other systems reviewed and are negative except as documented above in ROS and HPI.  ____________________________________________   PHYSICAL EXAM:  VITAL SIGNS: ED Triage Vitals  Enc Vitals Group     BP 11/22/20 1557 (!) 74/44     Pulse Rate 11/22/20 1557 (!) 121     Resp 11/22/20 1557 19     Temp 11/22/20 1557 97.6 F (36.4 C)     Temp src --      SpO2 11/22/20 1557 99 %     Weight --      Height --      Head Circumference --      Peak Flow --      Pain Score 11/22/20 1556 0  Pain Loc --      Pain Edu? --      Excl. in GC? --     Vital signs reviewed, nursing assessments reviewed.   Constitutional:   Alert and oriented. Non-toxic appearance. Eyes:   Conjunctivae are normal. EOMI. PERRL. ENT      Head:   Normocephalic and atraumatic.      Nose:   Wearing a mask.      Mouth/Throat:   Wearing a mask.      Neck:   No meningismus. Full ROM. Hematological/Lymphatic/Immunilogical:   No cervical lymphadenopathy. Cardiovascular:   Tachycardia heart rate 120. Symmetric bilateral radial and DP pulses.  No murmurs. Cap refill less than 2 seconds. Respiratory:   Normal respiratory effort without tachypnea/retractions.  Bilateral basilar crackles, increased on the right compared to left.  No  wheezing Gastrointestinal:   Soft and nontender. Non distended. There is no CVA tenderness.  No rebound, rigidity, or guarding.  Musculoskeletal:   Normal range of motion in all extremities. No joint effusions.  No lower extremity tenderness.  No edema. Neurologic:   Normal speech and language.  Motor grossly intact. No acute focal neurologic deficits are appreciated.  Skin:    Skin is warm, dry and intact. No rash noted.  No petechiae, purpura, or bullae.  ____________________________________________    LABS (pertinent positives/negatives) (all labs ordered are listed, but only abnormal results are displayed) Labs Reviewed  BASIC METABOLIC PANEL - Abnormal; Notable for the following components:      Result Value   Sodium 151 (*)    Chloride 112 (*)    Glucose, Bld 214 (*)    BUN 59 (*)    Creatinine, Ser 2.06 (*)    Calcium 8.3 (*)    GFR, Estimated 23 (*)    All other components within normal limits  CBC - Abnormal; Notable for the following components:   WBC 1.5 (*)    RBC 2.97 (*)    Hemoglobin 9.7 (*)    HCT 31.4 (*)    MCV 105.7 (*)    RDW 16.8 (*)    nRBC 1.3 (*)    All other components within normal limits  POC SARS CORONAVIRUS 2 AG -  ED - Abnormal; Notable for the following components:   SARS Coronavirus 2 Ag Positive (*)    All other components within normal limits  CULTURE, BLOOD (ROUTINE X 2)  CULTURE, BLOOD (ROUTINE X 2)  URINE CULTURE  URINALYSIS, COMPLETE (UACMP) WITH MICROSCOPIC  LACTIC ACID, PLASMA  LACTIC ACID, PLASMA  PROTIME-INR  APTT  DIFFERENTIAL  HEPATIC FUNCTION PANEL  PROCALCITONIN  CBG MONITORING, ED   ____________________________________________   EKG  Interpreted by me Sinus tachycardia with frequent premature junctional complexes, rate of 123, normal axis and intervals.  Normal QRS ST segments and T waves.  ____________________________________________    RADIOLOGY  No results  found.  ____________________________________________   PROCEDURES .Critical Care Performed by: Sharman CheekStafford, Janyiah Silveri, MD Authorized by: Sharman CheekStafford, Shamica Moree, MD   Critical care provider statement:    Critical care time (minutes):  35   Critical care time was exclusive of:  Separately billable procedures and treating other patients   Critical care was necessary to treat or prevent imminent or life-threatening deterioration of the following conditions:  Sepsis, shock and dehydration   Critical care was time spent personally by me on the following activities:  Development of treatment plan with patient or surrogate, discussions with consultants, evaluation of patient's response to treatment, examination  of patient, obtaining history from patient or surrogate, ordering and performing treatments and interventions, ordering and review of laboratory studies, ordering and review of radiographic studies, pulse oximetry, re-evaluation of patient's condition and review of old charts Comments:         .1-3 Lead EKG Interpretation Performed by: Sharman Cheek, MD Authorized by: Sharman Cheek, MD     Interpretation: abnormal     ECG rate:  121   ECG rate assessment: tachycardic     Rhythm: sinus rhythm     Ectopy: PJCs     Conduction: normal      ____________________________________________  DIFFERENTIAL DIAGNOSIS   Dehydration, electrolyte abnormality, pleural effusion, pneumonia, sepsis, COVID-19 infection, other viral syndrome, cystitis  CLINICAL IMPRESSION / ASSESSMENT AND PLAN / ED COURSE  Medications ordered in the ED: Medications  sodium chloride 0.9 % bolus 1,000 mL (1,000 mLs Intravenous New Bag/Given 11/22/20 1651)    And  sodium chloride 0.9 % bolus 1,000 mL (has no administration in time range)  cefTRIAXone (ROCEPHIN) 2 g in sodium chloride 0.9 % 100 mL IVPB (2 g Intravenous New Bag/Given 11/22/20 1650)  azithromycin (ZITHROMAX) 500 mg in sodium chloride 0.9 % 250 mL IVPB  (has no administration in time range)  dexamethasone (DECADRON) injection 10 mg (has no administration in time range)  remdesivir 200 mg in sodium chloride 0.9% 250 mL IVPB (has no administration in time range)    Followed by  remdesivir 100 mg in sodium chloride 0.9 % 100 mL IVPB (has no administration in time range)    Pertinent labs & imaging results that were available during my care of the patient were reviewed by me and considered in my medical decision making (see chart for details).  ELIYAH MCSHEA was evaluated in Emergency Department on 11/22/2020 for the symptoms described in the history of present illness. She was evaluated in the context of the global COVID-19 pandemic, which necessitated consideration that the patient might be at risk for infection with the SARS-CoV-2 virus that causes COVID-19. Institutional protocols and algorithms that pertain to the evaluation of patients at risk for COVID-19 are in a state of rapid change based on information released by regulatory bodies including the CDC and federal and state organizations. These policies and algorithms were followed during the patient's care in the ED.     Clinical Course as of 11/22/20 2105  Mon Nov 22, 2020  0102 Patient presents with tachycardia, hypotension, appears dehydrated.  She has lung crackles which are asymmetric, predominantly on the right side, worried about Covid versus pneumonia and sepsis.  Will start 2 L IV fluid bolus, empiric antibiotics with ceftriaxone and azithromycin.  Rapid antigen Covid test is immediately positive, will order Decadron and remdesivir as well.  Patient is not hypoxic, no respiratory distress. [PS]  1700 Chest x-ray viewed and interpreted by me, shows bilateral hazy infiltrates, much more prominent on the right, consistent with covid pneumonia  [PS]  1727 Cxr rad report confirms multifocal pneumonia [PS]  1952 BP improved, MAP 70. HR 166, suspicious for SVT on monitor. Will repeat ekg.  [PS]  2029 Patient noted to be in SVT with a heart rate of about 170.  No change in symptoms, blood pressure stable.  She was given 6 mg of IV adenosine, converted to sinus rhythm. [PS]  2053 Back in SVT with heart rate of 166 again.  Will give another dose of adenosine and plan to start diltiazem infusion at 5 mg/hr.  [PS]  2104  2 L IV fluid bolus finished.  Blood pressure has normalized.  Sepsis reassessment completed.  Repeat lactate normal.  Not in septic shock.  Vasopressors not needed.  She did have recurrent SVT which has caused a decrease in blood pressure which I think is due to decreased cardiac output and not due to distributive/septic shock.  Patient was again cardioverted with 6 mg adenosine just now.  Will start amiodarone infusion. [PS]    Clinical Course User Index [PS] Sharman Cheek, MD     ____________________________________________   FINAL CLINICAL IMPRESSION(S) / ED DIAGNOSES    Final diagnoses:  Pneumonia due to COVID-19 virus  AKI (acute kidney injury) [facility])     ED Discharge Orders    None      Portions of this note were generated with dragon dictation software. Dictation errors may occur despite best attempts at proofreading.   Sharman Cheek, MD 11/22/20 [phone]

## 2020-11-22 NOTE — ED Triage Notes (Signed)
Pt comes into the [facility] from home with c/o increased generalized weakness with SOB over the past week.. A/Ox4 on arrival

## 2020-11-22 NOTE — Telephone Encounter (Signed)
Done...  Per pt son Terry's request to move 1/26 lab/+/- blood appts out to 1/28.Marland KitchenLC

## 2020-11-22 NOTE — Progress Notes (Signed)
Notified bedside nurse of need to administer fluid bolus (pt needs 1848 cc) and repeat lactate due @ 1836.

## 2020-11-22 NOTE — ED Notes (Signed)
Will start azithromycin once rocephin is complete

## 2020-11-22 NOTE — ED Notes (Signed)
Date and time results received: 11/22/20 1722 (use smartphrase ".now" to insert current time)  Test: Lactic  Critical Value: 3.1  Name of Provider Notified: Scotty Court MD  Orders Received? Or Actions Taken?: Orders Received - See Orders for details

## 2020-11-22 NOTE — Telephone Encounter (Signed)
Pt has appt scheduled for 11/24/20 pt would like to cancel. Please call son if there are further questions

## 2020-11-22 NOTE — Progress Notes (Signed)
PHARMACIST - PHYSICIAN COMMUNICATION  CONCERNING:  Enoxaparin (Lovenox) for DVT Prophylaxis    RECOMMENDATION: Patient was prescribed enoxaprin 40mg  q24 hours for VTE prophylaxis.   There were no vitals filed for this visit.  Body mass index is 21.27 kg/m.  Estimated Creatinine Clearance: 20.1 mL/min (A) (by C-G formula based on SCr of 2.06 mg/dL (H)).   Patient is candidate for enoxaparin 30mg  every 24 hours based on CrCl <41ml/min or Weight <45kg  DESCRIPTION: Pharmacy has adjusted enoxaparin dose per [facility] policy.  Patient is now receiving enoxaparin 30 mg every 24 hours   31m, PharmD, [facility] 11/22/2020 11:38 PM

## 2020-11-22 NOTE — ED Notes (Addendum)
Pt first NS fluid bolus still running at this time. Will start second bolus upon first completion

## 2020-11-23 ENCOUNTER — Inpatient Hospital Stay (HOSPITAL_COMMUNITY)
Admit: 2020-11-23 | Discharge: 2020-11-23 | Disposition: A | Discharge status: Home / Self Care | Payer: Medicare HMO | Attending: Internal Medicine | Admitting: Internal Medicine

## 2020-11-23 ENCOUNTER — Other Ambulatory Visit: Payer: Self-pay

## 2020-11-23 ENCOUNTER — Encounter: Payer: Self-pay | Admitting: Internal Medicine

## 2020-11-23 DIAGNOSIS — I361 Nonrheumatic tricuspid (valve) insufficiency: Secondary | ICD-10-CM

## 2020-11-23 DIAGNOSIS — R9431 Abnormal electrocardiogram [ECG] [EKG]: Secondary | ICD-10-CM

## 2020-11-23 DIAGNOSIS — U071 COVID-19: Principal | ICD-10-CM

## 2020-11-23 DIAGNOSIS — E870 Hyperosmolality and hypernatremia: Secondary | ICD-10-CM | POA: Insufficient documentation

## 2020-11-23 DIAGNOSIS — N179 Acute kidney failure, unspecified: Secondary | ICD-10-CM | POA: Insufficient documentation

## 2020-11-23 DIAGNOSIS — N170 Acute kidney failure with tubular necrosis: Secondary | ICD-10-CM | POA: Diagnosis not present

## 2020-11-23 DIAGNOSIS — J9601 Acute respiratory failure with hypoxia: Secondary | ICD-10-CM | POA: Diagnosis not present

## 2020-11-23 DIAGNOSIS — I38 Endocarditis, valve unspecified: Secondary | ICD-10-CM

## 2020-11-23 DIAGNOSIS — I471 Supraventricular tachycardia: Secondary | ICD-10-CM

## 2020-11-23 DIAGNOSIS — J1282 Pneumonia due to coronavirus disease 2019: Secondary | ICD-10-CM | POA: Insufficient documentation

## 2020-11-23 DIAGNOSIS — E1165 Type 2 diabetes mellitus with hyperglycemia: Secondary | ICD-10-CM

## 2020-11-23 DIAGNOSIS — D649 Anemia, unspecified: Secondary | ICD-10-CM

## 2020-11-23 DIAGNOSIS — I340 Nonrheumatic mitral (valve) insufficiency: Secondary | ICD-10-CM

## 2020-11-23 LAB — CBC WITH DIFFERENTIAL/PLATELET
Abs Immature Granulocytes: 0.02 10*3/uL (ref 0.00–0.07)
Basophils Absolute: 0 10*3/uL (ref 0.0–0.1)
Basophils Relative: 0 %
Eosinophils Absolute: 0 10*3/uL (ref 0.0–0.5)
Eosinophils Relative: 0 %
HCT: 23.9 % — ABNORMAL LOW (ref 36.0–46.0)
Hemoglobin: 7.4 g/dL — ABNORMAL LOW (ref 12.0–15.0)
Immature Granulocytes: 2 %
Lymphocytes Relative: 25 %
Lymphs Abs: 0.3 10*3/uL — ABNORMAL LOW (ref 0.7–4.0)
MCH: 33.3 pg (ref 26.0–34.0)
MCHC: 31 g/dL (ref 30.0–36.0)
MCV: 107.7 fL — ABNORMAL HIGH (ref 80.0–100.0)
Monocytes Absolute: 0.1 10*3/uL (ref 0.1–1.0)
Monocytes Relative: 5 %
Neutro Abs: 0.9 10*3/uL — ABNORMAL LOW (ref 1.7–7.7)
Neutrophils Relative %: 68 %
Platelets: 203 10*3/uL (ref 150–400)
RBC: 2.22 MIL/uL — ABNORMAL LOW (ref 3.87–5.11)
RDW: 16.9 % — ABNORMAL HIGH (ref 11.5–15.5)
Smear Review: NORMAL
WBC: 1.3 10*3/uL — CL (ref 4.0–10.5)
nRBC: 0 % (ref 0.0–0.2)

## 2020-11-23 LAB — PATHOLOGIST SMEAR REVIEW

## 2020-11-23 LAB — URINALYSIS, COMPLETE (UACMP) WITH MICROSCOPIC
Bilirubin Urine: NEGATIVE
Glucose, UA: NEGATIVE mg/dL
Ketones, ur: NEGATIVE mg/dL
Leukocytes,Ua: NEGATIVE
Nitrite: NEGATIVE
Protein, ur: 100 mg/dL — AB
Specific Gravity, Urine: 1.017 (ref 1.005–1.030)
pH: 5 (ref 5.0–8.0)

## 2020-11-23 LAB — COMPREHENSIVE METABOLIC PANEL
ALT: 22 U/L (ref 0–44)
AST: 50 U/L — ABNORMAL HIGH (ref 15–41)
Albumin: 2.4 g/dL — ABNORMAL LOW (ref 3.5–5.0)
Alkaline Phosphatase: 45 U/L (ref 38–126)
Anion gap: 7 (ref 5–15)
BUN: 56 mg/dL — ABNORMAL HIGH (ref 8–23)
CO2: 26 mmol/L (ref 22–32)
Calcium: 7 mg/dL — ABNORMAL LOW (ref 8.9–10.3)
Chloride: 118 mmol/L — ABNORMAL HIGH (ref 98–111)
Creatinine, Ser: 1.55 mg/dL — ABNORMAL HIGH (ref 0.44–1.00)
GFR, Estimated: 33 mL/min — ABNORMAL LOW (ref >60)
Glucose, Bld: 292 mg/dL — ABNORMAL HIGH (ref 70–99)
Potassium: 4.4 mmol/L (ref 3.5–5.1)
Sodium: 151 mmol/L — ABNORMAL HIGH (ref 135–145)
Total Bilirubin: 0.5 mg/dL (ref 0.3–1.2)
Total Protein: 6.4 g/dL — ABNORMAL LOW (ref 6.5–8.1)

## 2020-11-23 LAB — ECHOCARDIOGRAM COMPLETE
Area-P 1/2: 3.45 cm2
Height: 67 in
Single Plane A4C EF: 53.8 %

## 2020-11-23 LAB — HEMOGLOBIN A1C
Hgb A1c MFr Bld: 6.5 % — ABNORMAL HIGH (ref 4.8–5.6)
Mean Plasma Glucose: 139.85 mg/dL

## 2020-11-23 LAB — C-REACTIVE PROTEIN: CRP: 11.1 mg/dL — ABNORMAL HIGH (ref <1.0)

## 2020-11-23 LAB — IRON AND TIBC
Iron: 25 ug/dL — ABNORMAL LOW (ref 28–170)
Saturation Ratios: 22 % (ref 10.4–31.8)
TIBC: 116 ug/dL — ABNORMAL LOW (ref 250–450)
UIBC: 91 ug/dL

## 2020-11-23 LAB — FERRITIN: Ferritin: 1216 ng/mL — ABNORMAL HIGH (ref 11–307)

## 2020-11-23 LAB — CBG MONITORING, ED
Glucose-Capillary: 264 mg/dL — ABNORMAL HIGH (ref 70–99)
Glucose-Capillary: 156 mg/dL — ABNORMAL HIGH (ref 70–99)
Glucose-Capillary: 106 mg/dL — ABNORMAL HIGH (ref 70–99)

## 2020-11-23 LAB — TROPONIN I (HIGH SENSITIVITY): Troponin I (High Sensitivity): 189 ng/L (ref <18)

## 2020-11-23 LAB — VITAMIN B12: Vitamin B-12: 1284 pg/mL — ABNORMAL HIGH (ref 180–914)

## 2020-11-23 MED ORDER — INSULIN ASPART 100 UNIT/ML ~~LOC~~ SOLN
3.0000 [IU] | Freq: Three times a day (TID) | SUBCUTANEOUS | Status: DC
  Administered 2020-11-23 – 2020-11-26 (×8): 3 [IU] via SUBCUTANEOUS
  Filled 2020-11-23 (×9): qty 1

## 2020-11-23 MED ORDER — METHYLPREDNISOLONE SODIUM SUCC 125 MG IJ SOLR
60.0000 mg | Freq: Two times a day (BID) | INTRAMUSCULAR | Status: DC
  Administered 2020-11-23 – 2020-11-24 (×3): 60 mg via INTRAVENOUS
  Filled 2020-11-23 (×3): qty 2

## 2020-11-23 MED ORDER — INSULIN DETEMIR 100 UNIT/ML ~~LOC~~ SOLN
8.0000 [IU] | Freq: Every day | SUBCUTANEOUS | Status: DC
  Administered 2020-11-23 – 2020-11-26 (×3): 8 [IU] via SUBCUTANEOUS
  Filled 2020-11-23 (×4): qty 0.08

## 2020-11-23 MED ORDER — METOPROLOL TARTRATE 5 MG/5ML IV SOLN
5.0000 mg | INTRAVENOUS | Status: AC
  Administered 2020-11-23: 5 mg via INTRAVENOUS
  Filled 2020-11-23: qty 5

## 2020-11-23 MED ORDER — SODIUM CHLORIDE 0.9 % IV SOLN
100.0000 mg | Freq: Every day | INTRAVENOUS | Status: AC
  Administered 2020-11-23 – 2020-11-27 (×5): 100 mg via INTRAVENOUS
  Filled 2020-11-23 (×2): qty 20
  Filled 2020-11-23 (×3): qty 100

## 2020-11-23 MED ORDER — METOPROLOL TARTRATE 5 MG/5ML IV SOLN
5.0000 mg | INTRAVENOUS | Status: DC | PRN
  Administered 2020-11-24 (×2): 5 mg via INTRAVENOUS
  Filled 2020-11-23 (×2): qty 5

## 2020-11-23 MED ORDER — INSULIN ASPART 100 UNIT/ML ~~LOC~~ SOLN
0.0000 [IU] | Freq: Every day | SUBCUTANEOUS | Status: DC
  Administered 2020-11-24 – 2020-11-25 (×2): 2 [IU] via SUBCUTANEOUS
  Filled 2020-11-23 (×2): qty 1

## 2020-11-23 MED ORDER — AMIODARONE IV BOLUS ONLY 150 MG/100ML: 150.0000 mg | Freq: Once | INTRAVENOUS | Status: DC

## 2020-11-23 MED ORDER — INSULIN ASPART 100 UNIT/ML ~~LOC~~ SOLN
0.0000 [IU] | Freq: Three times a day (TID) | SUBCUTANEOUS | Status: DC
  Administered 2020-11-23: 5 [IU] via SUBCUTANEOUS
  Administered 2020-11-23: 2 [IU] via SUBCUTANEOUS
  Administered 2020-11-24 – 2020-11-25 (×2): 3 [IU] via SUBCUTANEOUS
  Administered 2020-11-25 (×2): 1 [IU] via SUBCUTANEOUS
  Administered 2020-11-26: 2 [IU] via SUBCUTANEOUS
  Administered 2020-11-26 – 2020-11-27 (×3): 3 [IU] via SUBCUTANEOUS
  Administered 2020-11-27: 1 [IU] via SUBCUTANEOUS
  Filled 2020-11-23 (×11): qty 1

## 2020-11-23 NOTE — ED Notes (Signed)
Pt provided with meal tray and repositioned in bed but pt states that she is not hungry at this time.

## 2020-11-23 NOTE — ED Notes (Signed)
Pt noted to be hypoxic at 88% on 4L while sleeping. Pt increased to [facility]

## 2020-11-23 NOTE — Progress Notes (Signed)
Patient ID: Martha Butler, female   DOB: 06-27-1935, 86 y.o.   MRN: [id number] [facility] PROGRESS NOTE  Martha Butler KWI:[id number] DOB: [birth year] DOA: 11/22/2020 PCP: Martha Baas, MD  HPI/Subjective: Patient states that she has had a poor appetite and does not feel well.  Some shortness of breath.  Some cough.  Objective: Vitals:   11/23/20 1200 11/23/20 1230  BP: (!) 105/56 (!) 99/57  Pulse: 80 74  Resp: (!) 24 (!) 25  Temp:    SpO2: 93% 92%    Intake/Output Summary (Last 24 hours) at 11/23/2020 1349 Last data filed at 11/23/2020 2426 Gross per 24 hour  Intake 778.58 ml  Output --  Net 778.58 ml   There were no vitals filed for this visit.  ROS: Review of Systems  Respiratory: Positive for cough and shortness of breath.   Cardiovascular: Negative for chest pain.  Gastrointestinal: Negative for abdominal pain, nausea and vomiting.   Exam: Physical Exam HENT:     Head: Normocephalic.     Mouth/Throat:     Pharynx: No oropharyngeal exudate.  Eyes:     General: Lids are normal.     Conjunctiva/sclera: Conjunctivae normal.  Cardiovascular:     Rate and Rhythm: Tachycardia present. Rhythm irregularly irregular.     Heart sounds: Normal heart sounds, S1 normal and S2 normal.  Pulmonary:     Breath sounds: Examination of the right-middle field reveals decreased breath sounds. Examination of the left-middle field reveals decreased breath sounds. Examination of the right-lower field reveals decreased breath sounds and rhonchi. Examination of the left-lower field reveals decreased breath sounds and rhonchi. Decreased breath sounds and rhonchi present. No wheezing or rales.  Abdominal:     Palpations: Abdomen is soft.     Tenderness: There is no abdominal tenderness.  Musculoskeletal:     Right lower leg: Swelling present.     Left lower leg: Swelling present.  Skin:    General: Skin is warm.     Findings: No rash.  Neurological:     Mental Status: She  is alert and oriented to person, place, and time.       Data Reviewed: Basic Metabolic Panel: Recent Labs  Lab 11/22/20 1557 11/22/20 2100 11/23/20 0404  NA 151*  --  151*  K 4.5  --  4.4  CL 112*  --  118*  CO2 26  --  26  GLUCOSE 214*  --  292*  BUN 59*  --  56*  CREATININE 2.06*  --  1.55*  CALCIUM 8.3*  --  7.0*  MG  --  2.7*  --    Liver Function Tests: Recent Labs  Lab 11/22/20 1636 11/23/20 0404  AST 75* 50*  ALT 31 22  ALKPHOS 55 45  BILITOT 0.7 0.5  PROT 8.5* 6.4*  ALBUMIN 3.1* 2.4*   CBC: Recent Labs  Lab 11/22/20 1557 11/23/20 0404  WBC 1.6*  1.5* 1.3*  NEUTROABS 1.2* 0.9*  HGB 9.6*  9.7* 7.4*  HCT 30.9*  31.4* 23.9*  MCV 105.8*  105.7* 107.7*  PLT 267  269 203    CBG: Recent Labs  Lab 11/23/20 1153  GLUCAP 264*    Recent Results (from the past 240 hour(s))  Blood Culture (routine x 2)     Status: None (Preliminary result)   Collection Time: 11/22/20  4:49 PM   Specimen: BLOOD  Result Value Ref Range Status   Specimen Description BLOOD BLOOD LEFT ARM  Final  Special Requests   Final    BOTTLES DRAWN AEROBIC AND ANAEROBIC Blood Culture adequate volume   Culture   Final    NO GROWTH < 24 HOURS Performed at [facility], [address] [place], [place] [address]    Report Status PENDING  Incomplete  Blood Culture (routine x 2)     Status: None (Preliminary result)   Collection Time: 11/22/20  4:49 PM   Specimen: BLOOD  Result Value Ref Range Status   Specimen Description BLOOD LEFT ANTECUBITAL  Final   Special Requests   Final    BOTTLES DRAWN AEROBIC AND ANAEROBIC Blood Culture adequate volume   Culture   Final    NO GROWTH < 24 HOURS Performed at [facility], [address] [place], [place] [address]    Report Status PENDING  Incomplete     Studies: DG Chest Port 1 View  Result Date: 11/22/2020 CLINICAL DATA:  86 year old female with concern for sepsis. EXAM: PORTABLE CHEST 1 VIEW COMPARISON:   Chest radiograph dated 10/12/2020. FINDINGS: Bilateral streaky densities most consistent with pneumonia, possibly viral or atypical in etiology including COVID-19. Clinical correlation is recommended. No pleural effusion or pneumothorax. There is mild eventration of the left hemidiaphragm. Stable cardiac silhouette. No acute osseous pathology. IMPRESSION: Multifocal pneumonia. Electronically Signed   By: Elgie CollardArash  Radparvar M.D.   On: 11/22/2020 17:04   ECHOCARDIOGRAM COMPLETE  Result Date: 11/23/2020    ECHOCARDIOGRAM REPORT   Patient Name:   Martha Butler Date of Exam: 11/23/2020 Medical Rec #:  [id number]       Height:       67.0 in Accession #:    [id number]      Weight:       135.8 lb Date of Birth:  03-23-1935       BSA:          1.715 m Patient Age:    85 years        BP:           80/61 mmHg Patient Gender: F               HR:           78 bpm. Exam Location:  [facility] Procedure: 2D Echo, Limited Color Doppler and Cardiac Doppler Indications:     R94.31 Abnormal ECG  History:         Patient has no prior history of Echocardiogram examinations.                  Signs/Symptoms:Shortness of Breath; Risk Factors:Hypertension.                  Pt tested positive for COVID-19 on 11/22/20.  Sonographer:     Martha RollsJoan Heiss RDCS (AE) Referring Phys:  [id number] Martha ProPETER K Butler Diagnosing Phys: Martha Kendallhristopher End MD  Sonographer Comments: Technically challenging study due to limited acoustic windows, no parasternal window, suboptimal apical window and suboptimal subcostal window. IMPRESSIONS  1. Left ventricular ejection fraction, by estimation, is >55%. Left ventricular endocardial border not optimally defined to evaluate regional wall motion. Left ventricular diastolic parameters are consistent with Grade III diastolic dysfunction (restrictive).  2. Pulmonary artery pressure is at least moderately elevated (40-45 mmHg plus central venous pressure). Right ventricular systolic function was not well visualized. The right  ventricular size is not well visualized.  3. The mitral valve was not well visualized. Moderate to severe mitral valve regurgitation. No evidence of mitral stenosis.  4. Tricuspid valve regurgitation is moderate to severe.  5. The aortic valve was not well visualized. Aortic valve regurgitation not assessed.  6. Pulmonic valve regurgitation not assessed. FINDINGS  Left Ventricle: Unable to assess left ventricular size and wall thickness. Left ventricular ejection fraction, by estimation, is >55%. Left ventricular endocardial border not optimally defined to evaluate regional wall motion. Left ventricular diastolic  parameters are consistent with Grade III diastolic dysfunction (restrictive). Right Ventricle: Pulmonary artery pressure is at least moderately elevated (40-45 mmHg plus central venous pressure). The right ventricular size is not well visualized. Right vetricular wall thickness was not assessed. Right ventricular systolic function  was not well visualized. Left Atrium: Left atrial size was not well visualized. Right Atrium: Right atrial size was not well visualized. Pericardium: There is no evidence of pericardial effusion. Mitral Valve: The mitral valve was not well visualized. Moderate to severe mitral valve regurgitation. No evidence of mitral valve stenosis. MV peak gradient, 8.5 mmHg. The mean mitral valve gradient is 2.0 mmHg. Tricuspid Valve: The tricuspid valve is not well visualized. Tricuspid valve regurgitation is moderate to severe. Aortic Valve: The aortic valve was not well visualized. Aortic valve regurgitation not assessed. Pulmonic Valve: The pulmonic valve was not well visualized. Pulmonic valve regurgitation not assessed. Aorta: The aortic root was not well visualized. Venous: The inferior vena cava was not well visualized. IAS/Shunts: The interatrial septum was not well visualized.   LV Volumes (MOD) LV vol d, MOD A4C: 48.5 ml Diastology LV vol s, MOD A4C: 22.4 ml LV e' medial:    6.85  cm/s LV SV MOD A4C:     48.5 ml LV E/e' medial:  14.2                            LV e' lateral:   8.59 cm/s                            LV E/e' lateral: 11.3  MITRAL VALVE               TRICUSPID VALVE MV Area (PHT): 3.45 cm    TR Peak grad:   44.6 mmHg MV Peak grad:  8.5 mmHg    TR Vmax:        334.00 cm/s MV Mean grad:  2.0 mmHg MV Vmax:       1.46 m/s MV Vmean:      60.3 cm/s MV Decel Time: 220 msec MV E velocity: 97.30 cm/s MV A velocity: 30.80 cm/s MV E/A ratio:  3.16 Cristal Deer End MD Electronically signed by Martha Kendall MD Signature Date/Time: 11/23/2020/1:02:21 PM    Final     Scheduled Meds: . aspirin EC  81 mg Oral Daily  . docusate sodium  100 mg Oral Daily  . enoxaparin (LOVENOX) injection  30 mg Subcutaneous Q24H  . escitalopram  5 mg Oral Daily  . folic acid  1 mg Oral Daily  . insulin aspart  0-5 Units Subcutaneous QHS  . insulin aspart  0-9 Units Subcutaneous TID WC  . insulin aspart  3 Units Subcutaneous TID WC  . insulin detemir  8 Units Subcutaneous Daily  . methylPREDNISolone (SOLU-MEDROL) injection  60 mg Intravenous Q12H   Continuous Infusions: . sodium chloride 75 mL/hr at 11/23/20 0729  . amiodarone 30 mg/hr (11/23/20 0729)  . azithromycin Stopped (11/22/20 1909)  . cefTRIAXone (ROCEPHIN)  IV Stopped (11/22/20  1652)  . remdesivir 100 mg in NS 100 mL Stopped (11/23/20 1128)   Brief history.  Patient admitted with narrow complex ventricular tachycardia not responsive to adenosine pushes and started on amiodarone.  Patient has low blood pressure poor appetite and found to have COVID-19 pneumonia and acute hypoxic respiratory failure.  Patient also has hypernatremia and acute kidney injury.  Assessment/Plan:  1. Supraventricular tachycardia.  Patient on amiodarone drip and as needed pushes of metoprolol.  Heart rate this morning when I saw her was 140.  Heart rate better this afternoon.  May Butler up needing a CT scan of the chest once kidney function is  better. 2. Acute hypoxic respiratory failure secondary to COVID-19 pneumonia.  Empirically put on antibiotics.  Change Decadron to Solu-Medrol.  Remdesivir.  Patient on 4 L nasal cannula.  Continue to watch inflammatory markers. 3. Acute kidney injury.  Creatinine 2.06 on presentation and now down to 1.55.  Continue IV fluids.  Hold Cozaar 4. Hypernatremia and poor appetite.  Sodium still elevated 151 continue IV fluid hydration with half-normal saline. 5. Anemia and leukopenia.  Continue to watch closely.  Send off iron studies.  May Butler up needing a blood transfusion tomorrow if hemoglobin drops below 7. 6. Type 2 diabetes mellitus with hemoglobin A1c of 6.5.  Sugars elevated with steroids will put on low-dose Lantus and sliding scale but likely can do diet control upon disposition 7. Depression on Lexapro        Code Status:     Code Status Orders  (From admission, onward)         Start     Ordered   11/22/20 2113  Full code  Continuous        11/22/20 2118        Code Status History    This patient has a current code status but no historical code status.   Advance Care Planning Activity     Family Communication: Spoke with son on the phone Disposition Plan: Status is: Inpatient  Dispo: The patient is from: Home              Anticipated d/c is to: Hopefully home              Anticipated d/c date is: Likely will need a few days here in the hospital              Patient currently being treated for SVT, acute hypoxic respiratory failure and COVID-19 pneumonia acute kidney injury and hypernatremia not ready for disposition yet.   Difficult to place patient.  Yes if requires rehab, know if able to go home  Time spent: 29 minutes  Garrett [employer]

## 2020-11-23 NOTE — Progress Notes (Signed)
*  PRELIMINARY RESULTS* Echocardiogram 2D Echocardiogram has been performed.  Okla Qazi M Barney Gertsch 11/23/2020, 10:03 AM 

## 2020-11-23 NOTE — ED Notes (Addendum)
Pt would not swallow pills. Provided applesauce to pt to get pt to swallow pills.   Pt brief saturated with urine. Changed, cleaned, purewick placed.

## 2020-11-23 NOTE — ED Notes (Signed)
This RN called pharmacy to verify remdesivir dosing because the 200mg  dose was never given (yesterday afternoon or all night). Spoke to pharmacist who stated she would place a 100mg  dose since pt already received 100mg  dose this AM to make up for the initial 200mg  dose that was not given. The pharmacist stated she would schedule the 4 doses after that of the 100mg  dose.

## 2020-11-23 NOTE — ED Notes (Signed)
Pt provided with breakfast tray. Sat up on stretcher, lights turned on to allow pt to see to eat.

## 2020-11-23 NOTE — ED Notes (Signed)
Pt's heart rate now under 100bpm without any interventions.

## 2020-11-23 NOTE — ED Notes (Signed)
Pt's heart rate noted to be in the 130's at this time. EKG performed and MD notified.

## 2020-11-23 NOTE — Consult Note (Addendum)
Cardiology Consultation:   Patient ID: [name] MRN: [id number]; DOB: Mar 16, 1935  Admit date: 11/22/2020 Date of Consult: 11/23/2020  Primary Care Provider: Enid Baas, MD [facility] Cardiologist: New - Tyshauna Finkbiner [facility] Electrophysiologist:  None    Patient Profile:   Martha Butler is a 86 y.o. female with a hx of chronic anemia, chronic neutropenia, hypertension, diabetes mellitus, and GERD, who is being seen today for the evaluation of tachycardia at the request of Dr. Renae Gloss.  History of Present Illness:   Ms. Aro reports that she has been feeling fatigued and generally weak over the last several days in the setting of poor appetite.  She also notes considerable exertional dyspnea with mild activity.  She has not had any chest pain, palpitations, lightheadedness, or edema.  She was found to be hypotensive and tachycardic in the emergency department.  Narrow complex tachycardia briefly terminated with adenosine.  Due to hypotension, she was placed on IV amiodarone that resulted in maintaining sinus rhythm for the most part though intermittent salvos of narrow complex tachycardia have occurred.  She was given IV fluids for dehydration.  She currently reports feeling well.  In the ED, she was found to be COVID-19 positive.   Past Medical History:  Diagnosis Date  . Depression   . GERD (gastroesophageal reflux disease)   . Hypertension   . Leukopenia   . Macrocytic anemia     History reviewed. No pertinent surgical history.   Home Medications:  Prior to Admission medications   Medication Sig Start Date Maxim Bedel Date Taking? Authorizing Provider  aspirin EC 81 MG tablet Take 1 tablet by mouth daily.   Yes [provider]  ciprofloxacin (CIPRO) 250 MG tablet TAKE 1 TABLET BY MOUTH TWICE DAILY 11/03/20  Yes Rickard Patience, MD  docusate sodium (COLACE) 100 MG capsule Take 1 capsule (100 mg total) by mouth daily. Do not take if you have loose stools 11/10/20  Yes  Rickard Patience, MD  escitalopram (LEXAPRO) 5 MG tablet Take 5 mg by mouth daily. 06/26/18  Yes [provider]  losartan (COZAAR) 50 MG tablet 50 mg daily. 02/25/18  Yes [provider]    Inpatient Medications: Scheduled Meds: . aspirin EC  81 mg Oral Daily  . docusate sodium  100 mg Oral Daily  . enoxaparin (LOVENOX) injection  30 mg Subcutaneous Q24H  . escitalopram  5 mg Oral Daily  . folic acid  1 mg Oral Daily  . insulin aspart  0-5 Units Subcutaneous QHS  . insulin aspart  0-9 Units Subcutaneous TID WC  . insulin aspart  3 Units Subcutaneous TID WC  . insulin detemir  8 Units Subcutaneous Daily  . methylPREDNISolone (SOLU-MEDROL) injection  60 mg Intravenous Q12H   Continuous Infusions: . sodium chloride 75 mL/hr at 11/23/20 1600  . amiodarone 30 mg/hr (11/23/20 0729)  . amiodarone Stopped (11/23/20 1835)  . azithromycin Stopped (11/23/20 1745)  . cefTRIAXone (ROCEPHIN)  IV Stopped (11/23/20 1635)  . remdesivir 100 mg in NS 100 mL Stopped (11/23/20 1128)   PRN Meds: acetaminophen, metoprolol tartrate, senna-docusate  Allergies:   No Known Allergies  Social History:   Social History   Tobacco Use  . Smoking status: Never Smoker  . Smokeless tobacco: Never Used  Substance Use Topics  . Alcohol use: No  . Drug use: No     Family History:   Patient does not recall any specific family history.  ROS:  Review of Systems  Unable to  perform ROS: Mental acuity     Physical Exam/Data:   Vitals:   11/23/20 1800 11/23/20 1815 11/23/20 1830 11/23/20 1930  BP: 92/62  (!) 106/54 (!) 113/56  Pulse:  (!) 130 94 91  Resp: (!) 28 (!) 28 (!) 24 (!) 24  Temp:   98.2 F (36.8 C)   TempSrc:   Oral   SpO2:  93% 99% 98%  Height:        Intake/Output Summary (Last 24 hours) at 11/23/2020 2029 Last data filed at 11/23/2020 0729 Gross per 24 hour  Intake 778.58 ml  Output --  Net 778.58 ml   Last 3 Weights 11/10/2020 10/12/2020 09/14/2020  Weight (lbs) 135 lb  12.8 oz 134 lb 8 oz 134 lb 4.8 oz  Weight (kg) 61.598 kg 61.009 kg 60.918 kg     Body mass index is 21.27 kg/m.  General: Frail, elderly woman lying in bed.  She appears comfortable. HEENT: normal Lymph: no adenopathy Neck: no JVD Endocrine:  No thryomegaly Vascular: No carotid bruits; FA pulses 2+ bilaterally without bruits  Cardiac: Regular rate and rhythm with 2/6 systolic and diastolic murmurs. Lungs: Diminished breath sounds throughout without wheezes or crackles. Abd: soft, nontender, no hepatomegaly  Ext: no edema Musculoskeletal:  No deformities, BUE and BLE strength normal and equal Skin: warm and dry  Neuro:  CNs 2-12 intact, no focal abnormalities noted Psych:  Normal affect   EKG:  The EKG performed today at 18:20 was personally reviewed and demonstrates: Narrow complex tachycardia most consistent with SVT. Telemetry:  Telemetry was personally reviewed and demonstrates: Normal sinus rhythm with intermittent narrow complex tachycardia most consistent with PSVT.  Relevant CV Studies: TTE (11/23/2020): 1. Left ventricular ejection fraction, by estimation, is >55%. Left  ventricular endocardial border not optimally defined to evaluate regional  wall motion. Left ventricular diastolic parameters are consistent with  Grade III diastolic dysfunction  (restrictive).  2. Pulmonary artery pressure is at least moderately elevated (40-45 mmHg  plus central venous pressure). Right ventricular systolic function was not  well visualized. The right ventricular size is not well visualized.  3. The mitral valve was not well visualized. Moderate to severe mitral  valve regurgitation. No evidence of mitral stenosis.  4. Tricuspid valve regurgitation is moderate to severe.  5. The aortic valve was not well visualized. Aortic valve regurgitation  not assessed.  6. Pulmonic valve regurgitation not assessed.   Laboratory Data:  High Sensitivity Troponin:   Recent Labs  Lab  11/23/20 0017  TROPONINIHS 189*     Chemistry Recent Labs  Lab 11/22/20 1557 11/23/20 0404  NA 151* 151*  K 4.5 4.4  CL 112* 118*  CO2 26 26  GLUCOSE 214* 292*  BUN 59* 56*  CREATININE 2.06* 1.55*  CALCIUM 8.3* 7.0*  GFRNONAA 23* 33*  ANIONGAP 13 7    Recent Labs  Lab 11/22/20 1636 11/23/20 0404  PROT 8.5* 6.4*  ALBUMIN 3.1* 2.4*  AST 75* 50*  ALT 31 22  ALKPHOS 55 45  BILITOT 0.7 0.5   Hematology Recent Labs  Lab 11/22/20 1557 11/23/20 0404  WBC 1.6*  1.5* 1.3*  RBC 2.92*  2.97* 2.22*  HGB 9.6*  9.7* 7.4*  HCT 30.9*  31.4* 23.9*  MCV 105.8*  105.7* 107.7*  MCH 32.9  32.7 33.3  MCHC 31.1  30.9 31.0  RDW 16.7*  16.8* 16.9*  PLT 267  269 203   BNPNo results for input(s): BNP, PROBNP in the last 168 hours.  DDimer No results for input(s): DDIMER in the last 168 hours.   Radiology/Studies:  DG Chest Port 1 View  Result Date: 11/22/2020 CLINICAL DATA:  86 year old female with concern for sepsis. EXAM: PORTABLE CHEST 1 VIEW COMPARISON:  Chest radiograph dated 10/12/2020. FINDINGS: Bilateral streaky densities most consistent with pneumonia, possibly viral or atypical in etiology including COVID-19. Clinical correlation is recommended. No pleural effusion or pneumothorax. There is mild eventration of the left hemidiaphragm. Stable cardiac silhouette. No acute osseous pathology. IMPRESSION: Multifocal pneumonia. Electronically Signed   By: Elgie Collard M.D.   On: 11/22/2020 17:04   ECHOCARDIOGRAM COMPLETE  Result Date: 11/23/2020    ECHOCARDIOGRAM REPORT   Patient Name:   MKENZIE DOTTS Date of Exam: 11/23/2020 Medical Rec #:  [id number]       Height:       67.0 in Accession #:    [id number]      Weight:       135.8 lb Date of Birth:  July 27, 1935       BSA:          1.715 m Patient Age:    85 years        BP:           80/61 mmHg Patient Gender: F               HR:           78 bpm. Exam Location:  [facility] Procedure: 2D Echo, Limited Color Doppler and Cardiac  Doppler Indications:     R94.31 Abnormal ECG  History:         Patient has no prior history of Echocardiogram examinations.                  Signs/Symptoms:Shortness of Breath; Risk Factors:Hypertension.                  Pt tested positive for COVID-19 on 11/22/20.  Sonographer:     Humphrey Rolls RDCS (AE) Referring Phys:  [id number] Lilia Pro Diagnosing Phys: Yvonne Kendall MD  Sonographer Comments: Technically challenging study due to limited acoustic windows, no parasternal window, suboptimal apical window and suboptimal subcostal window. IMPRESSIONS  1. Left ventricular ejection fraction, by estimation, is >55%. Left ventricular endocardial border not optimally defined to evaluate regional wall motion. Left ventricular diastolic parameters are consistent with Grade III diastolic dysfunction (restrictive).  2. Pulmonary artery pressure is at least moderately elevated (40-45 mmHg plus central venous pressure). Right ventricular systolic function was not well visualized. The right ventricular size is not well visualized.  3. The mitral valve was not well visualized. Moderate to severe mitral valve regurgitation. No evidence of mitral stenosis.  4. Tricuspid valve regurgitation is moderate to severe.  5. The aortic valve was not well visualized. Aortic valve regurgitation not assessed.  6. Pulmonic valve regurgitation not assessed. FINDINGS  Left Ventricle: Unable to assess left ventricular size and wall thickness. Left ventricular ejection fraction, by estimation, is >55%. Left ventricular endocardial border not optimally defined to evaluate regional wall motion. Left ventricular diastolic  parameters are consistent with Grade III diastolic dysfunction (restrictive). Right Ventricle: Pulmonary artery pressure is at least moderately elevated (40-45 mmHg plus central venous pressure). The right ventricular size is not well visualized. Right vetricular wall thickness was not assessed. Right ventricular systolic  function  was not well visualized. Left Atrium: Left atrial size was not well visualized. Right Atrium: Right atrial size was not well visualized. Pericardium: There  is no evidence of pericardial effusion. Mitral Valve: The mitral valve was not well visualized. Moderate to severe mitral valve regurgitation. No evidence of mitral valve stenosis. MV peak gradient, 8.5 mmHg. The mean mitral valve gradient is 2.0 mmHg. Tricuspid Valve: The tricuspid valve is not well visualized. Tricuspid valve regurgitation is moderate to severe. Aortic Valve: The aortic valve was not well visualized. Aortic valve regurgitation not assessed. Pulmonic Valve: The pulmonic valve was not well visualized. Pulmonic valve regurgitation not assessed. Aorta: The aortic root was not well visualized. Venous: The inferior vena cava was not well visualized. IAS/Shunts: The interatrial septum was not well visualized.   LV Volumes (MOD) LV vol d, MOD A4C: 48.5 ml Diastology LV vol s, MOD A4C: 22.4 ml LV e' medial:    6.85 cm/s LV SV MOD A4C:     48.5 ml LV E/e' medial:  14.2                            LV e' lateral:   8.59 cm/s                            LV E/e' lateral: 11.3  MITRAL VALVE               TRICUSPID VALVE MV Area (PHT): 3.45 cm    TR Peak grad:   44.6 mmHg MV Peak grad:  8.5 mmHg    TR Vmax:        334.00 cm/s MV Mean grad:  2.0 mmHg MV Vmax:       1.46 m/s MV Vmean:      60.3 cm/s MV Decel Time: 220 msec MV E velocity: 97.30 cm/s MV A velocity: 30.80 cm/s MV E/A ratio:  3.16 Rian Busche MD Electronically signed by Yvonne Kendallhristopher Amyra Vantuyl MD Signature Date/Time: 11/23/2020/1:02:21 PM    Final      Assessment and Plan:   PSVT: Bouts of narrow complex tachycardia including the ones on presentation that briefly responded to adenosine, most suggestive of supraventricular tachycardia.  These are likely exacerbated by her COVID-19 infection, electrolyte abnormalities, and acute kidney injury.  Given soft blood pressure, she has been  maintained on IV amiodarone.  Agree with continuing IV amiodarone overnight.  If SVT remains relatively quiescent, transitioning to oral amiodarone could be considered tomorrow.  Would favor maintaining amiodarone until the patient has recovered from COVID-19.  Valvular heart disease: Echocardiogram performed today was poor quality but suggest moderate to severe mitral and tricuspid regurgitation.  Significant systolic and diastolic murmurs are appreciated on exam today.  Consider repeating TTE when the patient's clinical condition has improved and she is better able to comply with examination.  COVID-19:  Ongoing therapy per internal medicine.  Acute kidney injury: Likely due to poor oral intake and intravascular volume depletion.  Continue IV and oral hydration.  Avoid nephrotoxic agents.  Acute on chronic anemia: Chronic issue though hemoglobin is down 2 gm since admission.  No evidence of active bleeding.  Favor discontinuation of aspirin, though I will defer this to the primary team.  Consider PRBC transfusion if symptoms develop or hemoglobin falls below 7.  For questions or updates, please contact [facility] Please consult [email or web address] for contact info under [facility].   Signed, Yvonne Kendallhristopher Terrisha Lopata, MD  11/23/2020 8:29 PM

## 2020-11-23 NOTE — ED Notes (Signed)
Admitting at bedside 

## 2020-11-24 ENCOUNTER — Ambulatory Visit: Payer: Medicare HMO

## 2020-11-24 ENCOUNTER — Other Ambulatory Visit: Payer: Medicare HMO

## 2020-11-24 ENCOUNTER — Telehealth: Payer: Self-pay | Admitting: Oncology

## 2020-11-24 ENCOUNTER — Encounter: Payer: Self-pay | Admitting: Internal Medicine

## 2020-11-24 DIAGNOSIS — N179 Acute kidney failure, unspecified: Secondary | ICD-10-CM | POA: Diagnosis not present

## 2020-11-24 DIAGNOSIS — I471 Supraventricular tachycardia: Secondary | ICD-10-CM | POA: Diagnosis not present

## 2020-11-24 DIAGNOSIS — U071 COVID-19: Secondary | ICD-10-CM | POA: Diagnosis not present

## 2020-11-24 DIAGNOSIS — F32A Depression, unspecified: Secondary | ICD-10-CM | POA: Diagnosis not present

## 2020-11-24 DIAGNOSIS — E870 Hyperosmolality and hypernatremia: Secondary | ICD-10-CM

## 2020-11-24 LAB — CBC WITH DIFFERENTIAL/PLATELET
Abs Immature Granulocytes: 0.08 10*3/uL — ABNORMAL HIGH (ref 0.00–0.07)
Basophils Absolute: 0 10*3/uL (ref 0.0–0.1)
Basophils Relative: 1 %
Eosinophils Absolute: 0 10*3/uL (ref 0.0–0.5)
Eosinophils Relative: 0 %
HCT: 26.2 % — ABNORMAL LOW (ref 36.0–46.0)
Hemoglobin: 7.9 g/dL — ABNORMAL LOW (ref 12.0–15.0)
Immature Granulocytes: 6 %
Lymphocytes Relative: 22 %
Lymphs Abs: 0.3 10*3/uL — ABNORMAL LOW (ref 0.7–4.0)
MCH: 32.2 pg (ref 26.0–34.0)
MCHC: 30.2 g/dL (ref 30.0–36.0)
MCV: 106.9 fL — ABNORMAL HIGH (ref 80.0–100.0)
Monocytes Absolute: 0 10*3/uL — ABNORMAL LOW (ref 0.1–1.0)
Monocytes Relative: 3 %
Neutro Abs: 0.9 10*3/uL — ABNORMAL LOW (ref 1.7–7.7)
Neutrophils Relative %: 68 %
Platelets: 345 10*3/uL (ref 150–400)
RBC: 2.45 MIL/uL — ABNORMAL LOW (ref 3.87–5.11)
RDW: 17.3 % — ABNORMAL HIGH (ref 11.5–15.5)
Smear Review: NORMAL
WBC: 1.3 10*3/uL — CL (ref 4.0–10.5)
nRBC: 3 % — ABNORMAL HIGH (ref 0.0–0.2)

## 2020-11-24 LAB — C-REACTIVE PROTEIN: CRP: 8.8 mg/dL — ABNORMAL HIGH (ref <1.0)

## 2020-11-24 LAB — LACTATE DEHYDROGENASE: LDH: 610 U/L — ABNORMAL HIGH (ref 98–192)

## 2020-11-24 LAB — COMPREHENSIVE METABOLIC PANEL
ALT: 21 U/L (ref 0–44)
AST: 49 U/L — ABNORMAL HIGH (ref 15–41)
Albumin: 2.5 g/dL — ABNORMAL LOW (ref 3.5–5.0)
Alkaline Phosphatase: 60 U/L (ref 38–126)
Anion gap: 12 (ref 5–15)
BUN: 63 mg/dL — ABNORMAL HIGH (ref 8–23)
CO2: 22 mmol/L (ref 22–32)
Calcium: 7.2 mg/dL — ABNORMAL LOW (ref 8.9–10.3)
Chloride: 119 mmol/L — ABNORMAL HIGH (ref 98–111)
Creatinine, Ser: 1.53 mg/dL — ABNORMAL HIGH (ref 0.44–1.00)
GFR, Estimated: 34 mL/min — ABNORMAL LOW (ref >60)
Glucose, Bld: 144 mg/dL — ABNORMAL HIGH (ref 70–99)
Potassium: 4.1 mmol/L (ref 3.5–5.1)
Sodium: 153 mmol/L — ABNORMAL HIGH (ref 135–145)
Total Bilirubin: 0.4 mg/dL (ref 0.3–1.2)
Total Protein: 7 g/dL (ref 6.5–8.1)

## 2020-11-24 LAB — TYPE AND SCREEN
ABO/RH(D): O POS
Antibody Screen: NEGATIVE

## 2020-11-24 LAB — THYROID PANEL WITH TSH
Free Thyroxine Index: 1.5 (ref 1.2–4.9)
T3 Uptake Ratio: 31 % (ref 24–39)
T4, Total: 4.9 ug/dL (ref 4.5–12.0)
TSH: 0.725 u[IU]/mL (ref 0.450–4.500)

## 2020-11-24 LAB — GLUCOSE, CAPILLARY
Glucose-Capillary: 117 mg/dL — ABNORMAL HIGH (ref 70–99)
Glucose-Capillary: 213 mg/dL — ABNORMAL HIGH (ref 70–99)
Glucose-Capillary: 202 mg/dL — ABNORMAL HIGH (ref 70–99)

## 2020-11-24 LAB — CBG MONITORING, ED: Glucose-Capillary: 102 mg/dL — ABNORMAL HIGH (ref 70–99)

## 2020-11-24 LAB — VITAMIN B12: Vitamin B-12: 1864 pg/mL — ABNORMAL HIGH (ref 180–914)

## 2020-11-24 LAB — FIBRIN DERIVATIVES D-DIMER (ARMC ONLY): Fibrin derivatives D-dimer (ARMC): 6429.13 ng/mL (FEU) — ABNORMAL HIGH (ref 0.00–499.00)

## 2020-11-24 MED ORDER — SODIUM CHLORIDE 0.45 % IV SOLN: INTRAVENOUS | Status: DC

## 2020-11-24 MED ORDER — METHYLPREDNISOLONE SODIUM SUCC 125 MG IJ SOLR
60.0000 mg | Freq: Every day | INTRAMUSCULAR | Status: DC
  Administered 2020-11-25 – 2020-11-27 (×3): 60 mg via INTRAVENOUS
  Filled 2020-11-24 (×3): qty 2

## 2020-11-24 MED ORDER — AMIODARONE LOAD VIA INFUSION
150.0000 mg | Freq: Once | INTRAVENOUS | Status: AC
  Administered 2020-11-24: 150 mg via INTRAVENOUS
  Filled 2020-11-24: qty 83.34

## 2020-11-24 NOTE — Progress Notes (Signed)
Progress Note  Patient Name: Martha Butler Date of Encounter: 11/24/2020  [facility] Cardiologist: New- Dr. Okey Dupre  Subjective   Denies chest pain, shortness of breath or palpitations.  Went into another episode of SVT later this afternoon, heart rates 150s, given a dose of IV amiodarone 150 mg x 1.  With conversion to sinus rhythm.  Inpatient Medications    Scheduled Meds: . aspirin EC  81 mg Oral Daily  . docusate sodium  100 mg Oral Daily  . enoxaparin (LOVENOX) injection  30 mg Subcutaneous Q24H  . escitalopram  5 mg Oral Daily  . folic acid  1 mg Oral Daily  . insulin aspart  0-5 Units Subcutaneous QHS  . insulin aspart  0-9 Units Subcutaneous TID WC  . insulin aspart  3 Units Subcutaneous TID WC  . insulin detemir  8 Units Subcutaneous Daily  . [START ON 11/25/2020] methylPREDNISolone (SOLU-MEDROL) injection  60 mg Intravenous Daily   Continuous Infusions: . sodium chloride 50 mL/hr at 11/24/20 0940  . amiodarone 30 mg/hr (11/24/20 1619)  . amiodarone Stopped (11/23/20 1835)  . azithromycin Stopped (11/23/20 1745)  . cefTRIAXone (ROCEPHIN)  IV 2 g (11/24/20 1621)  . remdesivir 100 mg in NS 100 mL Stopped (11/24/20 1012)   PRN Meds: acetaminophen, senna-docusate   Vital Signs    Vitals:   11/24/20 1120 11/24/20 1237 11/24/20 1558 11/24/20 1630  BP:    105/69  Pulse:    89  Resp:    15  Temp:    97.9 F (36.6 C)  TempSrc:      SpO2: 100% 95% 93% (!) 42%  Height:        Intake/Output Summary (Last 24 hours) at 11/24/2020 1653 Last data filed at 11/24/2020 1500 Gross per 24 hour  Intake 676.3 ml  Output 1200 ml  Net -523.7 ml   Last 3 Weights 11/10/2020 10/12/2020 09/14/2020  Weight (lbs) 135 lb 12.8 oz 134 lb 8 oz 134 lb 4.8 oz  Weight (kg) 61.598 kg 61.009 kg 60.918 kg      Telemetry    Sinus rhythm currently, previously today SVT heart rate 150s.- Personally Reviewed  ECG    No new tracing obtained.- Personally Reviewed  Physical Exam    GEN: No acute distress.   Neck: No JVD Cardiac: RRR, systolic murmur Respiratory:  Decreased breath sounds at bases GI: Soft, nontender, non-distended  MS: No edema; No deformity. Neuro:  Nonfocal  Psych: Normal affect   Labs    High Sensitivity Troponin:   Recent Labs  Lab 11/23/20 0017  TROPONINIHS 189*      Chemistry Recent Labs  Lab 11/22/20 1557 11/22/20 1636 11/23/20 0404 11/24/20 0356  NA 151*  --  151* 153*  K 4.5  --  4.4 4.1  CL 112*  --  118* 119*  CO2 26  --  26 22  GLUCOSE 214*  --  292* 144*  BUN 59*  --  56* 63*  CREATININE 2.06*  --  1.55* 1.53*  CALCIUM 8.3*  --  7.0* 7.2*  PROT  --  8.5* 6.4* 7.0  ALBUMIN  --  3.1* 2.4* 2.5*  AST  --  75* 50* 49*  ALT  --  31 22 21   ALKPHOS  --  55 45 60  BILITOT  --  0.7 0.5 0.4  GFRNONAA 23*  --  33* 34*  ANIONGAP 13  --  7 12     Hematology Recent Labs  Lab 11/22/20 1557 11/23/20 0404 11/24/20 0356  WBC 1.6*  1.5* 1.3* 1.3*  RBC 2.92*  2.97* 2.22* 2.45*  HGB 9.6*  9.7* 7.4* 7.9*  HCT 30.9*  31.4* 23.9* 26.2*  MCV 105.8*  105.7* 107.7* 106.9*  MCH 32.9  32.7 33.3 32.2  MCHC 31.1  30.9 31.0 30.2  RDW 16.7*  16.8* 16.9* 17.3*  PLT 267  269 203 345    BNPNo results for input(s): BNP, PROBNP in the last 168 hours.   DDimer No results for input(s): DDIMER in the last 168 hours.   Radiology    DG Chest Port 1 View  Result Date: 11/22/2020 CLINICAL DATA:  86 year old female with concern for sepsis. EXAM: PORTABLE CHEST 1 VIEW COMPARISON:  Chest radiograph dated 10/12/2020. FINDINGS: Bilateral streaky densities most consistent with pneumonia, possibly viral or atypical in etiology including COVID-19. Clinical correlation is recommended. No pleural effusion or pneumothorax. There is mild eventration of the left hemidiaphragm. Stable cardiac silhouette. No acute osseous pathology. IMPRESSION: Multifocal pneumonia. Electronically Signed   By: Elgie Collard M.D.   On: 11/22/2020 17:04    ECHOCARDIOGRAM COMPLETE  Result Date: 11/23/2020    ECHOCARDIOGRAM REPORT   Patient Name:   Martha Butler Date of Exam: 11/23/2020 Medical Rec #:  [id number]       Height:       67.0 in Accession #:    [id number]      Weight:       135.8 lb Date of Birth:  1935-06-09       BSA:          1.715 m Patient Age:    85 years        BP:           80/61 mmHg Patient Gender: F               HR:           78 bpm. Exam Location:  [facility] Procedure: 2D Echo, Limited Color Doppler and Cardiac Doppler Indications:     R94.31 Abnormal ECG  History:         Patient has no prior history of Echocardiogram examinations.                  Signs/Symptoms:Shortness of Breath; Risk Factors:Hypertension.                  Pt tested positive for COVID-19 on 11/22/20.  Sonographer:     Humphrey Rolls RDCS (AE) Referring Phys:  [id number] Lilia Pro Diagnosing Phys: Yvonne Kendall MD  Sonographer Comments: Technically challenging study due to limited acoustic windows, no parasternal window, suboptimal apical window and suboptimal subcostal window. IMPRESSIONS  1. Left ventricular ejection fraction, by estimation, is >55%. Left ventricular endocardial border not optimally defined to evaluate regional wall motion. Left ventricular diastolic parameters are consistent with Grade III diastolic dysfunction (restrictive).  2. Pulmonary artery pressure is at least moderately elevated (40-45 mmHg plus central venous pressure). Right ventricular systolic function was not well visualized. The right ventricular size is not well visualized.  3. The mitral valve was not well visualized. Moderate to severe mitral valve regurgitation. No evidence of mitral stenosis.  4. Tricuspid valve regurgitation is moderate to severe.  5. The aortic valve was not well visualized. Aortic valve regurgitation not assessed.  6. Pulmonic valve regurgitation not assessed. FINDINGS  Left Ventricle: Unable to assess left ventricular size and wall thickness. Left ventricular  ejection fraction, by  estimation, is >55%. Left ventricular endocardial border not optimally defined to evaluate regional wall motion. Left ventricular diastolic  parameters are consistent with Grade III diastolic dysfunction (restrictive). Right Ventricle: Pulmonary artery pressure is at least moderately elevated (40-45 mmHg plus central venous pressure). The right ventricular size is not well visualized. Right vetricular wall thickness was not assessed. Right ventricular systolic function  was not well visualized. Left Atrium: Left atrial size was not well visualized. Right Atrium: Right atrial size was not well visualized. Pericardium: There is no evidence of pericardial effusion. Mitral Valve: The mitral valve was not well visualized. Moderate to severe mitral valve regurgitation. No evidence of mitral valve stenosis. MV peak gradient, 8.5 mmHg. The mean mitral valve gradient is 2.0 mmHg. Tricuspid Valve: The tricuspid valve is not well visualized. Tricuspid valve regurgitation is moderate to severe. Aortic Valve: The aortic valve was not well visualized. Aortic valve regurgitation not assessed. Pulmonic Valve: The pulmonic valve was not well visualized. Pulmonic valve regurgitation not assessed. Aorta: The aortic root was not well visualized. Venous: The inferior vena cava was not well visualized. IAS/Shunts: The interatrial septum was not well visualized.   LV Volumes (MOD) LV vol d, MOD A4C: 48.5 ml Diastology LV vol s, MOD A4C: 22.4 ml LV e' medial:    6.85 cm/s LV SV MOD A4C:     48.5 ml LV E/e' medial:  14.2                            LV e' lateral:   8.59 cm/s                            LV E/e' lateral: 11.3  MITRAL VALVE               TRICUSPID VALVE MV Area (PHT): 3.45 cm    TR Peak grad:   44.6 mmHg MV Peak grad:  8.5 mmHg    TR Vmax:        334.00 cm/s MV Mean grad:  2.0 mmHg MV Vmax:       1.46 m/s MV Vmean:      60.3 cm/s MV Decel Time: 220 msec MV E velocity: 97.30 cm/s MV A velocity: 30.80 cm/s  MV E/A ratio:  3.16 Yvonne Kendall MD Electronically signed by Yvonne Kendall MD Signature Date/Time: 11/23/2020/1:02:21 PM    Final     Cardiac Studies   Echo 11/24/2019 1. Left ventricular ejection fraction, by estimation, is >55%. Left  ventricular endocardial border not optimally defined to evaluate regional  wall motion. Left ventricular diastolic parameters are consistent with  Grade III diastolic dysfunction  (restrictive).  2. Pulmonary artery pressure is at least moderately elevated (40-45 mmHg  plus central venous pressure). Right ventricular systolic function was not  well visualized. The right ventricular size is not well visualized.  3. The mitral valve was not well visualized. Moderate to severe mitral  valve regurgitation. No evidence of mitral stenosis.  4. Tricuspid valve regurgitation is moderate to severe.  5. The aortic valve was not well visualized. Aortic valve regurgitation  not assessed.  6. Pulmonic valve regurgitation not assessed.   Patient Profile     86 y.o. female with history of hypertension, diabetes presenting with shortness of breath and fatigue.  Diagnosed with COVID-19 pneumonia, hospital course complicated by SVT.  Assessment & Plan    1.  SVT -Had another  episode today.   -IV amiodarone bolus 150 mg x 1 with conversion to sinus rhythm. -Continue amiodarone drip.   -Consider transition to oral amiodarone with improvement in COVID-19 pneumonia and/or no further arrhythmias. -Covid pneumonia contributing to arrhythmia.  2.  COVID-19 pneumonia -Management as per primary team  3.  Mitral regurgitation, diastolic dysfunction -Serial monitoring of valvular disease with echo -Euvolemic  Total encounter time 35 minutes  Greater than 50% was spent in counseling and coordination of care with the patient     Signed, Debbe Odea, MD  11/24/2020, 4:53 PM

## 2020-11-24 NOTE — Plan of Care (Signed)
  Problem: Clinical Measurements: Goal: Ability to maintain clinical measurements within normal limits will improve Outcome: Not Progressing Goal: Respiratory complications will improve Outcome: Progressing   Problem: Nutrition: Goal: Adequate nutrition will be maintained Outcome: Not Progressing

## 2020-11-24 NOTE — Progress Notes (Signed)
PT Cancellation Note  Patient Details Name: Martha Butler MRN: [id number] DOB: 04-02-1935   Cancelled Treatment:    Reason Eval/Treat Not Completed:  (Consult received and chart reviewed. Patient currently declines participation with session, reports generally fatigued, not feeling well.  Encouraged OOB to chair for lunch; continued to decline.  Will continue efforts at later time/date as appropriate)  Olanda Boughner H. Manson Passey, PT, DPT, NCS 11/24/20, 11:32 AM [phone]

## 2020-11-24 NOTE — Telephone Encounter (Signed)
Pt scheduled for lab/ pos blood on 1/28. Pt's son called requesting to cancel appt due to patient being admitted. Please advise on r/s.

## 2020-11-24 NOTE — ED Notes (Signed)
CBG 102. Dr Allena Katz at bedside.

## 2020-11-24 NOTE — ED Notes (Signed)
Pt's HR noted to be 150 and sustaining that rate.  Pt given 5mg  prn metoprolol for tachycardia.

## 2020-11-24 NOTE — Telephone Encounter (Signed)
Call received today from pt's son in regards to pt's appointment on 11/26/20, pt's son stated pt is currently admitted and will not make appointment, pt contacts will call to r/s

## 2020-11-24 NOTE — Progress Notes (Signed)
PT Cancellation Note  Patient Details Name: Martha Butler MRN: [id number] DOB: [birth year]   Cancelled Treatment:    Reason Eval/Treat Not Completed: Patient declined, no reason specified.Patient reports she has not slept at all. Declines PT at this time. Will re-attempt tomorrow.    Ka Flammer 11/24/2020, 3:40 PM

## 2020-11-24 NOTE — Progress Notes (Signed)
[facility]  - [place] [facility]   PATIENT NAME: Martha Butler    MR#:  [id number]  DATE OF BIRTH:  1934/11/02  SUBJECTIVE:   Seen in the emergency room earlier. Complains of poor appetite weakness and shortness of breath.  REVIEW OF SYSTEMS:   Review of Systems  Constitutional: Positive for malaise/fatigue. Negative for chills, fever and weight loss.  HENT: Negative for ear discharge, ear pain and nosebleeds.   Eyes: Negative for blurred vision, pain and discharge.  Respiratory: Positive for shortness of breath. Negative for sputum production, wheezing and stridor.   Cardiovascular: Negative for chest pain, palpitations, orthopnea and PND.  Gastrointestinal: Negative for abdominal pain, diarrhea, nausea and vomiting.  Genitourinary: Negative for frequency and urgency.  Musculoskeletal: Negative for back pain and joint pain.  Neurological: Positive for weakness. Negative for sensory change, speech change and focal weakness.  Psychiatric/Behavioral: Negative for depression and hallucinations. The patient is not nervous/anxious.    Tolerating Diet:barely Tolerating PT: pending  DRUG ALLERGIES:  No Known Allergies  VITALS:  Blood pressure 117/72, pulse 89, temperature 98.4 F (36.9 C), temperature source Oral, resp. rate 17, height 5\' 7"  (1.702 m), SpO2 95 %.  PHYSICAL EXAMINATION:   Physical Exam  GENERAL:  86 y.o.-year-old patient lying in the bed with no acute distress.  HEENT: Head atraumatic, normocephalic. Oropharynx and nasopharynx clear.  LUNGS: Normal breath sounds bilaterally, no wheezing, rales, rhonchi. No use of accessory muscles of respiration.  CARDIOVASCULAR: S1, S2 normal. No murmurs, rubs, or gallops.  ABDOMEN: Soft, nontender, nondistended. Bowel sounds present. No organomegaly or mass.  EXTREMITIES: No cyanosis, clubbing or edema b/l.    NEUROLOGIC: Cranial nerves II through XII are intact. No focal Motor or sensory deficits b/l.   weak PSYCHIATRIC:  patient is alert and oriented x 2 SKIN: No obvious rash, lesion, or ulcer.   LABORATORY PANEL:  CBC Recent Labs  Lab 11/24/20 0356  WBC 1.3*  HGB 7.9*  HCT 26.2*  PLT 345    Chemistries  Recent Labs  Lab 11/22/20 2100 11/23/20 0404 11/24/20 0356  NA  --    < > 153*  K  --    < > 4.1  CL  --    < > 119*  CO2  --    < > 22  GLUCOSE  --    < > 144*  BUN  --    < > 63*  CREATININE  --    < > 1.53*  CALCIUM  --    < > 7.2*  MG 2.7*  --   --   AST  --    < > 49*  ALT  --    < > 21  ALKPHOS  --    < > 60  BILITOT  --    < > 0.4   < > = values in this interval not displayed.   Cardiac Enzymes No results for input(s): TROPONINI in the last 168 hours. RADIOLOGY:  DG Chest Port 1 View  Result Date: 11/22/2020 CLINICAL DATA:  86 year old female with concern for sepsis. EXAM: PORTABLE CHEST 1 VIEW COMPARISON:  Chest radiograph dated 10/12/2020. FINDINGS: Bilateral streaky densities most consistent with pneumonia, possibly viral or atypical in etiology including COVID-19. Clinical correlation is recommended. No pleural effusion or pneumothorax. There is mild eventration of the left hemidiaphragm. Stable cardiac silhouette. No acute osseous pathology. IMPRESSION: Multifocal pneumonia. Electronically Signed   By: 10/14/2020 M.D.   On: 11/22/2020  17:04   ECHOCARDIOGRAM COMPLETE  Result Date: 11/23/2020    ECHOCARDIOGRAM REPORT   Patient Name:   Martha Butler Date of Exam: 11/23/2020 Medical Rec #:  [id number]       Height:       67.0 in Accession #:    [id number]      Weight:       135.8 lb Date of Birth:  [birth year]       BSA:          1.715 m Patient Age:    85 years        BP:           80/61 mmHg Patient Gender: F               HR:           78 bpm. Exam Location:  [facility] Procedure: 2D Echo, Limited Color Doppler and Cardiac Doppler Indications:     R94.31 Abnormal ECG  History:         Patient has no prior history of Echocardiogram examinations.                   Signs/Symptoms:Shortness of Breath; Risk Factors:Hypertension.                  Pt tested positive for COVID-19 on 11/22/20.  Sonographer:     Humphrey Rolls RDCS (AE) Referring Phys:  [id number] Lilia Pro Diagnosing Phys: Yvonne Kendall MD  Sonographer Comments: Technically challenging study due to limited acoustic windows, no parasternal window, suboptimal apical window and suboptimal subcostal window. IMPRESSIONS  1. Left ventricular ejection fraction, by estimation, is >55%. Left ventricular endocardial border not optimally defined to evaluate regional wall motion. Left ventricular diastolic parameters are consistent with Grade III diastolic dysfunction (restrictive).  2. Pulmonary artery pressure is at least moderately elevated (40-45 mmHg plus central venous pressure). Right ventricular systolic function was not well visualized. The right ventricular size is not well visualized.  3. The mitral valve was not well visualized. Moderate to severe mitral valve regurgitation. No evidence of mitral stenosis.  4. Tricuspid valve regurgitation is moderate to severe.  5. The aortic valve was not well visualized. Aortic valve regurgitation not assessed.  6. Pulmonic valve regurgitation not assessed. FINDINGS  Left Ventricle: Unable to assess left ventricular size and wall thickness. Left ventricular ejection fraction, by estimation, is >55%. Left ventricular endocardial border not optimally defined to evaluate regional wall motion. Left ventricular diastolic  parameters are consistent with Grade III diastolic dysfunction (restrictive). Right Ventricle: Pulmonary artery pressure is at least moderately elevated (40-45 mmHg plus central venous pressure). The right ventricular size is not well visualized. Right vetricular wall thickness was not assessed. Right ventricular systolic function  was not well visualized. Left Atrium: Left atrial size was not well visualized. Right Atrium: Right atrial size was not well  visualized. Pericardium: There is no evidence of pericardial effusion. Mitral Valve: The mitral valve was not well visualized. Moderate to severe mitral valve regurgitation. No evidence of mitral valve stenosis. MV peak gradient, 8.5 mmHg. The mean mitral valve gradient is 2.0 mmHg. Tricuspid Valve: The tricuspid valve is not well visualized. Tricuspid valve regurgitation is moderate to severe. Aortic Valve: The aortic valve was not well visualized. Aortic valve regurgitation not assessed. Pulmonic Valve: The pulmonic valve was not well visualized. Pulmonic valve regurgitation not assessed. Aorta: The aortic root was not well visualized. Venous: The inferior vena cava was not well  visualized. IAS/Shunts: The interatrial septum was not well visualized.   LV Volumes (MOD) LV vol d, MOD A4C: 48.5 ml Diastology LV vol s, MOD A4C: 22.4 ml LV e' medial:    6.85 cm/s LV SV MOD A4C:     48.5 ml LV E/e' medial:  14.2                            LV e' lateral:   8.59 cm/s                            LV E/e' lateral: 11.3  MITRAL VALVE               TRICUSPID VALVE MV Area (PHT): 3.45 cm    TR Peak grad:   44.6 mmHg MV Peak grad:  8.5 mmHg    TR Vmax:        334.00 cm/s MV Mean grad:  2.0 mmHg MV Vmax:       1.46 m/s MV Vmean:      60.3 cm/s MV Decel Time: 220 msec MV E velocity: 97.30 cm/s MV A velocity: 30.80 cm/s MV E/A ratio:  3.16 Cristal Deer End MD Electronically signed by Yvonne Kendall MD Signature Date/Time: 11/23/2020/1:02:21 PM    Final    ASSESSMENT AND PLAN:  WILMETTA GRYDER is an 86 y.o. female with history of chronic macrocytic anemia, neutropenia, hypertension and GERD.  At baseline, patient lives by self and is checked on daily by son.  As per patient, over the past few days, she has felt extremely weak and fatigued with minimal exertion.  She also complained of shortness of breath.  Supraventricular tachycardia/Narrow complex Valvular heart disease   -Patient on amiodarone drip and HR much improved with  few intermittent SVT --Seen by Dr End-- will switch to PO amiodarone when cardiology seems appropriate --Echo showed EF of more than 55%. Moderate to severe MR and TR.  Acute hypoxic respiratory failure secondary to COVID-19 pneumonia. -- Empirically put on antibiotics.   Pro calcitonin .61. Will change to oral from tomorrow if continues to show improvement. --Cont Solu-Medrol.  Remdesivir.  Patient on 4 L nasal cannula.  Continue to watch inflammatory markers. -- Wean oxygen as tolerated. Patient not on chronic oxygen at home.  Acute kidney injury.  Failure to thrive/poor PO intake   Hypernatremia -Creatinine 2.06 on presentation and now down to 1.55.  - Continue IV fluids. -  Hold Cozaar  Hypernatremia and poor appetite.  Sodium still elevated 153  -continue IV fluid hydration with half-normal saline. -- Dietitian to see patient  Chronic anemia and chronic leukopenia.   --Continue to watch closely.   -- patient follows at the cancer center. According to the son she got blood transfusion about two weeks ago.  Type 2 diabetes mellitus with hemoglobin A1c of 6.5.   -Sugars elevated with steroids will put on low-dose Lantus and sliding scale but likely can do diet control upon disposition  Depression on Lexapro  Generalized weakness debility. Will have physical therapy see patient. According to the son at home patient does her own ADLs. She does not use cane or walker. No recent falls.   Procedures: Family communication : son Pollie Iaquinta on the phone Consults : CODE STATUS: full-- discussed with son DVT Prophylaxis : enoxaparin Level of care: Progressive Cardiac Status is: Inpatient  Remains inpatient appropriate because:Inpatient level of care  appropriate due to severity of illness   Dispo: The patient is from: Home              Anticipated d/c is to: Home              Anticipated d/c date is: 3 days              Patient currently is not medically stable to d/c.    Difficult to place patient No  Patient admitted with hypoxic respiratory failure secondary to COVID pneumonia. Also has SVT. Just got up on the floor. Physical therapy and TOC for discharge planning.      TOTAL TIME TAKING CARE OF THIS PATIENT: 25 minutes.  >50% time spent on counselling and coordination of care  Note: This dictation was prepared with Dragon dictation along with smaller phrase technology. Any transcriptional errors that result from this process are unintentional.  Enedina Finner M.D    [facility]   CC: Primary care physician; Enid Baas, MDPatient ID: Macon Large, female   DOB: 1935/03/25, 86 y.o.   MRN: [id number]

## 2020-11-25 ENCOUNTER — Encounter: Payer: Self-pay | Admitting: Internal Medicine

## 2020-11-25 ENCOUNTER — Inpatient Hospital Stay: Payer: Medicare HMO

## 2020-11-25 DIAGNOSIS — E440 Moderate protein-calorie malnutrition: Secondary | ICD-10-CM

## 2020-11-25 DIAGNOSIS — J1282 Pneumonia due to coronavirus disease 2019: Secondary | ICD-10-CM

## 2020-11-25 DIAGNOSIS — N179 Acute kidney failure, unspecified: Secondary | ICD-10-CM

## 2020-11-25 DIAGNOSIS — U071 COVID-19: Secondary | ICD-10-CM | POA: Diagnosis not present

## 2020-11-25 DIAGNOSIS — R531 Weakness: Secondary | ICD-10-CM | POA: Diagnosis not present

## 2020-11-25 DIAGNOSIS — I471 Supraventricular tachycardia: Secondary | ICD-10-CM | POA: Diagnosis not present

## 2020-11-25 DIAGNOSIS — E43 Unspecified severe protein-calorie malnutrition: Secondary | ICD-10-CM | POA: Insufficient documentation

## 2020-11-25 DIAGNOSIS — F32A Depression, unspecified: Secondary | ICD-10-CM | POA: Diagnosis not present

## 2020-11-25 LAB — COMPREHENSIVE METABOLIC PANEL
ALT: 18 U/L (ref 0–44)
AST: 34 U/L (ref 15–41)
Albumin: 2.6 g/dL — ABNORMAL LOW (ref 3.5–5.0)
Alkaline Phosphatase: 78 U/L (ref 38–126)
Anion gap: 12 (ref 5–15)
BUN: 63 mg/dL — ABNORMAL HIGH (ref 8–23)
CO2: 22 mmol/L (ref 22–32)
Calcium: 7.4 mg/dL — ABNORMAL LOW (ref 8.9–10.3)
Chloride: 121 mmol/L — ABNORMAL HIGH (ref 98–111)
Creatinine, Ser: 1.51 mg/dL — ABNORMAL HIGH (ref 0.44–1.00)
GFR, Estimated: 34 mL/min — ABNORMAL LOW (ref >60)
Glucose, Bld: 195 mg/dL — ABNORMAL HIGH (ref 70–99)
Potassium: 4 mmol/L (ref 3.5–5.1)
Sodium: 155 mmol/L — ABNORMAL HIGH (ref 135–145)
Total Bilirubin: 0.5 mg/dL (ref 0.3–1.2)
Total Protein: 6.8 g/dL (ref 6.5–8.1)

## 2020-11-25 LAB — FIBRIN DERIVATIVES D-DIMER (ARMC ONLY): Fibrin derivatives D-dimer (ARMC): >7500 ng/mL (FEU) — ABNORMAL HIGH (ref 0.00–499.00)

## 2020-11-25 LAB — GLUCOSE, CAPILLARY
Glucose-Capillary: 145 mg/dL — ABNORMAL HIGH (ref 70–99)
Glucose-Capillary: 147 mg/dL — ABNORMAL HIGH (ref 70–99)
Glucose-Capillary: 210 mg/dL — ABNORMAL HIGH (ref 70–99)
Glucose-Capillary: 229 mg/dL — ABNORMAL HIGH (ref 70–99)

## 2020-11-25 LAB — URINE CULTURE: Culture: <10000 — AB

## 2020-11-25 LAB — C-REACTIVE PROTEIN: CRP: 7 mg/dL — ABNORMAL HIGH (ref <1.0)

## 2020-11-25 MED ORDER — CEFUROXIME AXETIL 500 MG PO TABS
500.0000 mg | ORAL_TABLET | Freq: Two times a day (BID) | ORAL | Status: DC
  Filled 2020-11-25: qty 1

## 2020-11-25 MED ORDER — ENOXAPARIN SODIUM 60 MG/0.6ML ~~LOC~~ SOLN
1.0000 mg/kg | SUBCUTANEOUS | Status: DC
  Administered 2020-11-25 – 2020-11-28 (×4): 57.5 mg via SUBCUTANEOUS
  Filled 2020-11-25 (×5): qty 0.6

## 2020-11-25 MED ORDER — ADULT MULTIVITAMIN W/MINERALS CH
1.0000 | ORAL_TABLET | Freq: Every day | ORAL | Status: DC
  Administered 2020-11-26 – 2020-11-28 (×3): 1 via ORAL
  Filled 2020-11-25 (×3): qty 1

## 2020-11-25 MED ORDER — DEXTROSE 5 % IV SOLN: INTRAVENOUS | Status: DC

## 2020-11-25 MED ORDER — AZITHROMYCIN 250 MG PO TABS: 250.0000 mg | ORAL_TABLET | Freq: Every day | ORAL | Status: DC

## 2020-11-25 MED ORDER — ENOXAPARIN SODIUM 60 MG/0.6ML ~~LOC~~ SOLN
1.0000 mg/kg | Freq: Two times a day (BID) | SUBCUTANEOUS | Status: DC
  Filled 2020-11-25 (×2): qty 0.6

## 2020-11-25 MED ORDER — CEFUROXIME AXETIL 500 MG PO TABS
500.0000 mg | ORAL_TABLET | Freq: Every day | ORAL | Status: DC
  Administered 2020-11-25: 500 mg via ORAL
  Filled 2020-11-25: qty 1

## 2020-11-25 MED ORDER — IOHEXOL 350 MG/ML SOLN
75.0000 mL | Freq: Once | INTRAVENOUS | Status: AC | PRN
  Administered 2020-11-25: 75 mL via INTRAVENOUS

## 2020-11-25 MED ORDER — ENSURE ENLIVE PO LIQD
237.0000 mL | Freq: Three times a day (TID) | ORAL | Status: DC
  Administered 2020-11-25 – 2020-11-27 (×5): 237 mL via ORAL

## 2020-11-25 NOTE — Consult Note (Signed)
PHARMACIST - PHYSICIAN COMMUNICATION  CONCERNING:  Enoxaparin (Lovenox) for DVT Prophylaxis    RECOMMENDATION: Patient was prescribed enoxaprin 1mg /kg q12 hours for VTE treatment.   Filed Weights   11/24/20 1116 11/25/20 0500  Weight: 58.5 kg (129 lb) 58.6 kg (129 lb 3 oz)    Body mass index is 20.23 kg/m.  Estimated Creatinine Clearance: 26.1 mL/min (A) (by C-G formula based on SCr of 1.51 mg/dL (H)).   Patient is candidate for enoxaparin 1 mg/kg every 24 hours based on CrCl <86ml/min   DESCRIPTION: Pharmacy has adjusted enoxaparin dose per [facility] policy.  Patient is now receiving enoxaparin 1 mg/kg mg every 24 hours    [facility], PharmD Clinical Pharmacist  11/25/2020 11:35 AM

## 2020-11-25 NOTE — Progress Notes (Signed)
PT Cancellation Note  Patient Details Name: Martha Butler MRN: [id number] DOB: 10-19-1935   Cancelled Treatment:    Reason Eval/Treat Not Completed: Medical issues which prohibited therapy (Evaluation re-attempted.  Per chart review, patient with pending chest CT to rule out PE.  Will hold until results received and patient cleared for activity.  Will continue to follow and initiate as appropriate.)   Christi Wirick H. Manson Passey, PT, DPT, NCS 11/25/20, 12:09 PM [phone]

## 2020-11-25 NOTE — Telephone Encounter (Signed)
Pt is admitted please cancel appt on 1/28 per son request.   Dr. Cathie Hoops, pt was scheduled for lab /poss blood. Did you want this r/s or wait until she is discharged from hospital to decide? her next appt is on 2/9: lab/MD/ blood trans/ retacrit.

## 2020-11-25 NOTE — Telephone Encounter (Signed)
Done.Marland Kitchen... Spoke with pts son Aurther Loft and made him aware that 11/26/20 appt was cx and appts were R/s from 2/9 to 2/16

## 2020-11-25 NOTE — Progress Notes (Addendum)
[facility]  - [place] [facility]   PATIENT NAME: Martha Butler    MR#:  [id number]  DATE OF BIRTH:  1935/07/21  SUBJECTIVE:   Eating BF this am--picky eater Did not want to work with PT --refused 2 times On amiodarone gtt REVIEW OF SYSTEMS:   Review of Systems  Constitutional: Positive for malaise/fatigue. Negative for chills, fever and weight loss.  HENT: Negative for ear discharge, ear pain and nosebleeds.   Eyes: Negative for blurred vision, pain and discharge.  Respiratory: Positive for shortness of breath. Negative for sputum production, wheezing and stridor.   Cardiovascular: Negative for chest pain, palpitations, orthopnea and PND.  Gastrointestinal: Negative for abdominal pain, diarrhea, nausea and vomiting.  Genitourinary: Negative for frequency and urgency.  Musculoskeletal: Negative for back pain and joint pain.  Neurological: Positive for weakness. Negative for sensory change, speech change and focal weakness.  Psychiatric/Behavioral: Negative for depression and hallucinations. The patient is not nervous/anxious.    Tolerating Diet:barely Tolerating PT: pending  DRUG ALLERGIES:  No Known Allergies  VITALS:  Blood pressure (!) 115/46, pulse 83, temperature 97.6 F (36.4 C), temperature source Oral, resp. rate 19, height 5\' 7"  (1.702 m), weight 58.6 kg, SpO2 99 %.  PHYSICAL EXAMINATION:   Physical Exam  GENERAL:  86 y.o.-year-old patient lying in the bed with no acute distress.  LUNGS: Normal breath sounds bilaterally, no wheezing, rales, rhonchi. No use of accessory muscles of respiration.  CARDIOVASCULAR: S1, S2 normal. No murmurs, rubs, or gallops.  ABDOMEN: Soft, nontender, nondistended. Bowel sounds present. No organomegaly or mass.  EXTREMITIES: No cyanosis, clubbing or edema b/l.    NEUROLOGIC: Cranial nerves II through XII are intact. No focal Motor or sensory deficits b/l.  weak PSYCHIATRIC:  patient is alert and oriented x 2 SKIN:  No obvious rash, lesion, or ulcer.   LABORATORY PANEL:  CBC Recent Labs  Lab 11/24/20 0356  WBC 1.3*  HGB 7.9*  HCT 26.2*  PLT 345    Chemistries  Recent Labs  Lab 11/22/20 2100 11/23/20 0404 11/25/20 0751  NA  --    < > 155*  K  --    < > 4.0  CL  --    < > 121*  CO2  --    < > 22  GLUCOSE  --    < > 195*  BUN  --    < > 63*  CREATININE  --    < > 1.51*  CALCIUM  --    < > 7.4*  MG 2.7*  --   --   AST  --    < > 34  ALT  --    < > 18  ALKPHOS  --    < > 78  BILITOT  --    < > 0.5   < > = values in this interval not displayed.   Cardiac Enzymes No results for input(s): TROPONINI in the last 168 hours. RADIOLOGY:  No results found. ASSESSMENT AND PLAN:  GORGEOUS NEWLUN is an 86 y.o. female with history of chronic macrocytic anemia, neutropenia, hypertension and GERD.  At baseline, patient lives by self and is checked on daily by son.  As per patient, over the past few days, she has felt extremely weak and fatigued with minimal exertion.  She also complained of shortness of breath.  Supraventricular tachycardia/Narrow complex Valvular heart disease   -Patient on amiodarone drip and HR much improved with few intermittent SVT --Seen  by Dr Mariah Milling-- will switch to PO amiodarone when cardiology seems appropriate --Echo showed EF of more than 55%. Moderate to severe MR and TR.  Acute hypoxic respiratory failure secondary to COVID-19 pneumonia. -- Empirically put on antibiotics.   Pro calcitonin .61. Will change to oral from tomorrow if continues to show improvement. --Cont Solu-Medrol.  Remdesivir.  Patient on 3 L nasal cannula.  Continue to watch inflammatory markers. -- Wean oxygen as tolerated. Patient not on chronic oxygen at home.  Acute kidney injury.  Failure to thrive/poor PO intake   Hypernatremia -Creatinine 2.06 on presentation and now down to 1.55.--1.51 - Continue IV fluids. -  Hold Cozaar  Hypernatremia and poor appetite.  Sodium still elevated 153   -continue IV fluid hydration with d5water and encourage oral fluids  Chronic anemia and chronic leukopenia.   --Continue to watch closely.   -- patient follows at the cancer center with Dr Cathie Hoops. According to the son she got blood transfusion about two weeks ago.  Type 2 diabetes mellitus with hemoglobin A1c of 6.5.   -Sugars elevated with steroids will put on low-dose Lantus and sliding scale but likely can do diet control upon disposition  Depression on Lexapro  Generalized weakness debility. Will have physical therapy see patient. According to the son at home patient does her own ADLs. She does not use cane or walker. No recent falls.  Nutrition Status: Nutrition Problem: Severe Malnutrition Etiology: social / environmental circumstances (advanced age) Signs/Symptoms: severe fat depletion,severe muscle depletion  follow dietitian recs    Procedures: Family communication : son Zakaiya Lares on the phone today Consults : CODE STATUS: full-- discussed with son DVT Prophylaxis : enoxaparin Level of care: Progressive Cardiac Status is: Inpatient  Remains inpatient appropriate because:Inpatient level of care appropriate due to severity of illness   Dispo: The patient is from: Home              Anticipated d/c is to: Home              Anticipated d/c date is: 3 days              Patient currently is not medically stable to d/c.   Difficult to place patient No  Patient admitted with hypoxic respiratory failure secondary to COVID pneumonia. Also has SVT Physical therapy and TOC for discharge planning. Pt on amiodarone gtt Getting D5W for hypernatremia      TOTAL TIME TAKING CARE OF THIS PATIENT: 25 minutes.  >50% time spent on counselling and coordination of care  Note: This dictation was prepared with Dragon dictation along with smaller phrase technology. Any transcriptional errors that result from this process are unintentional.  Enedina Finner M.D    [facility]    CC: Primary care physician; Enid Baas, MDPatient ID: Martha Butler, female   DOB: 1935-07-20, 86 y.o.   MRN: [id number]

## 2020-11-25 NOTE — Progress Notes (Signed)
Initial Nutrition Assessment  DOCUMENTATION CODES:   Severe malnutrition in context of social or environmental circumstances  INTERVENTION:   Ensure Enlive po TID, each supplement provides 350 kcal and 20 grams of protein  Magic cup TID with meals, each supplement provides 290 kcal and 9 grams of protein  MVI po daily   Dysphagia 3 diet   Pt at high refeed risk; recommend monitor potassium, magnesium and phosphorus labs daily until stable  NUTRITION DIAGNOSIS:   Severe Malnutrition related to social / environmental circumstances (advanced age) as evidenced by severe fat depletion,severe muscle depletion.  GOAL:   Patient will meet greater than or equal to 90% of their needs  MONITOR:   PO intake,Supplement acceptance,Labs,Weight trends,I & O's,Skin  REASON FOR ASSESSMENT:   Consult,Malnutrition Screening Tool Assessment of nutrition requirement/status  ASSESSMENT:   86 y.o. female with a hx of chronic anemia, chronic neutropenia, hypertension, diabetes mellitus and GERD who is admitted with COVID 19 PNA, AKI and SVT   Met with pt in room today. Pt reports fair appetite and oral intake at baseline; pt reports that she does not eat a lot at home but that she eats more than what she has been eating in the hospital. Pt reports that she just does not have an appetite today; pt related this partially to having COVID but also reports that she does not like the hospital food. Pt ate 10% of her breakfast this morning and only a few bites of beef for lunch. Of note, pt with poor dentition. Pt reports that she drinks vanilla and strawberry Ensure at home. RD will add supplements and MVI to help pt meet her estimated needs. RD will also liberalize pt's diet. Per chart, pt is down 24lbs over the past couple of years. Pt is down 5lbs(4%) in < 2 weeks; this is significant weight loss.   Medications reviewed and include: aspirin, colace, lovenox, folic acid, insulin, solu-medrol, 5% dextrose  @100ml /hr  Labs reviewed: Na 155(H), BUN 63(H), creat 1.51(H) Wbc- 1.3(L), Hgb 7.9(L), Hct 26.2(L) cbgs- 145, 147 x 24 hrs AIC 6.5(H)- 1/25  NUTRITION - FOCUSED PHYSICAL EXAM:  Flowsheet Row Most Recent Value  Orbital Region Moderate depletion  Upper Arm Region Moderate depletion  Thoracic and Lumbar Region Severe depletion  Buccal Region Severe depletion  Temple Region Severe depletion  Clavicle Bone Region Severe depletion  Clavicle and Acromion Bone Region Severe depletion  Scapular Bone Region Severe depletion  Dorsal Hand Severe depletion  Patellar Region Severe depletion  Anterior Thigh Region Severe depletion  Posterior Calf Region Severe depletion  Edema (RD Assessment) None  Hair Reviewed  Eyes Reviewed  Mouth Reviewed  Skin Reviewed  Nails Reviewed     Diet Order:   Diet Order            DIET DYS 3 Room service appropriate? Yes; Fluid consistency: Thin  Diet effective now                EDUCATION NEEDS:   Education needs have been addressed  Skin:  Skin Assessment: Reviewed RN Assessment  Last BM:  1/27- TYPE 4  Height:   Ht Readings from Last 1 Encounters:  11/24/20 5' 7"  (1.702 m)    Weight:   Wt Readings from Last 1 Encounters:  11/25/20 58.6 kg    Ideal Body Weight:  61.36 kg  BMI:  Body mass index is 20.23 kg/m.  Estimated Nutritional Needs:   Kcal:  1500-1700kcal/day  Protein:  75-85g/day  Fluid:  1.5-1.7L/day  Koleen Distance MS, RD, LDN Please refer to [facility] for RD and/or RD on-call/weekend/after hours pager

## 2020-11-25 NOTE — Telephone Encounter (Signed)
She is admitted for covid. Please postpone her follow up of 2/9 to 2/16. Lab md blood retacrit

## 2020-11-25 NOTE — Progress Notes (Signed)
Progress Note  Patient Name: Martha Butler Date of Encounter: 11/25/2020  [facility] Cardiologist: New- Dr. Okey Dupre  Subjective   Feels well this morning, no complaints Denies any episodes of tachycardia Telemetry reviewed, short episodes of SVT noted 10 PM last night, 10:25 PM, 1:40 AM, 8:50 AM Typically episodes lasting 15 to 20 minutes Still on amiodarone infusion, burden of SVT dramatically improved   Inpatient Medications    Scheduled Meds: . aspirin EC  81 mg Oral Daily  . cefUROXime  500 mg Oral BID WC  . docusate sodium  100 mg Oral Daily  . enoxaparin (LOVENOX) injection  1 mg/kg Subcutaneous Q24H  . escitalopram  5 mg Oral Daily  . folic acid  1 mg Oral Daily  . insulin aspart  0-5 Units Subcutaneous QHS  . insulin aspart  0-9 Units Subcutaneous TID WC  . insulin aspart  3 Units Subcutaneous TID WC  . insulin detemir  8 Units Subcutaneous Daily  . methylPREDNISolone (SOLU-MEDROL) injection  60 mg Intravenous Daily   Continuous Infusions: . amiodarone 30 mg/hr (11/24/20 2309)  . dextrose    . remdesivir 100 mg in NS 100 mL 100 mg (11/25/20 0914)   PRN Meds: acetaminophen, senna-docusate   Vital Signs    Vitals:   11/24/20 1939 11/25/20 0428 11/25/20 0500 11/25/20 0800  BP: 123/61 (!) 136/95  (!) 115/46  Pulse: 88 86  83  Resp: 18 18  19   Temp: 97.9 F (36.6 C) 97.8 F (36.6 C)  97.6 F (36.4 C)  TempSrc:    Oral  SpO2: 97% 100%  99%  Weight:   58.6 kg   Height:        Intake/Output Summary (Last 24 hours) at 11/25/2020 1205 Last data filed at 11/24/2020 2000 Gross per 24 hour  Intake 676.3 ml  Output 800 ml  Net -123.7 ml   Last 3 Weights 11/25/2020 11/24/2020 11/10/2020  Weight (lbs) 129 lb 3 oz 129 lb 135 lb 12.8 oz  Weight (kg) 58.6 kg 58.514 kg 61.598 kg      Telemetry    Sinus rhythm currently, short runs of SVT as detailed above.- Personally Reviewed  ECG    No new tracing obtained.- Personally Reviewed  Physical Exam    GEN: No acute distress.   Neck: No JVD Cardiac: RRR, systolic murmur Respiratory:  Decreased breath sounds at bases GI: Soft, nontender, non-distended  MS: No edema; No deformity. Neuro:  Nonfocal  Psych: Normal affect   Labs    High Sensitivity Troponin:   Recent Labs  Lab 11/23/20 0017  TROPONINIHS 189*      Chemistry Recent Labs  Lab 11/23/20 0404 11/24/20 0356 11/25/20 0751  NA 151* 153* 155*  K 4.4 4.1 4.0  CL 118* 119* 121*  CO2 26 22 22   GLUCOSE 292* 144* 195*  BUN 56* 63* 63*  CREATININE 1.55* 1.53* 1.51*  CALCIUM 7.0* 7.2* 7.4*  PROT 6.4* 7.0 6.8  ALBUMIN 2.4* 2.5* 2.6*  AST 50* 49* 34  ALT 22 21 18   ALKPHOS 45 60 78  BILITOT 0.5 0.4 0.5  GFRNONAA 33* 34* 34*  ANIONGAP 7 12 12      Hematology Recent Labs  Lab 11/22/20 1557 11/23/20 0404 11/24/20 0356  WBC 1.6*  1.5* 1.3* 1.3*  RBC 2.92*  2.97* 2.22* 2.45*  HGB 9.6*  9.7* 7.4* 7.9*  HCT 30.9*  31.4* 23.9* 26.2*  MCV 105.8*  105.7* 107.7* 106.9*  MCH 32.9  32.7 33.3  32.2  MCHC 31.1  30.9 31.0 30.2  RDW 16.7*  16.8* 16.9* 17.3*  PLT 267  269 203 345    BNPNo results for input(s): BNP, PROBNP in the last 168 hours.   DDimer No results for input(s): DDIMER in the last 168 hours.   Radiology    No results found.  Cardiac Studies   Echo 11/24/2019 1. Left ventricular ejection fraction, by estimation, is >55%. Left  ventricular endocardial border not optimally defined to evaluate regional  wall motion. Left ventricular diastolic parameters are consistent with  Grade III diastolic dysfunction  (restrictive).  2. Pulmonary artery pressure is at least moderately elevated (40-45 mmHg  plus central venous pressure). Right ventricular systolic function was not  well visualized. The right ventricular size is not well visualized.  3. The mitral valve was not well visualized. Moderate to severe mitral  valve regurgitation. No evidence of mitral stenosis.  4. Tricuspid valve  regurgitation is moderate to severe.  5. The aortic valve was not well visualized. Aortic valve regurgitation  not assessed.  6. Pulmonic valve regurgitation not assessed.   Patient Profile     86 y.o. female with history of hypertension, diabetes presenting with shortness of breath and fatigue.  Diagnosed with COVID-19 pneumonia, hospital course complicated by SVT.  Assessment & Plan    1.  SVT Several short episodes in the past 24 hours, lasting 15 to 20 minutes -Overall dramatic improvement in the frequency and duration of tachyarrhythmia -Would consider continuing amiodarone infusion with transition to oral amiodarone tomorrow 400 twice daily for 7 days then down to 200 twice daily 7 days then down to 200 daily  2.  COVID-19 pneumonia, acute hypoxic respiratory failure, pneumonia -Management as per primary team On nasal cannula oxygen Appears relatively comfortable  3.  Valvular heart disease Moderate to severe mitral valve regurgitation Moderate to severe tricuspid valve regurgitation Possibly dynamic regurg from cardiac arrhythmia Mildly elevated right heart pressures Details discussed with her, Possibly exacerbated by underlying SVT No urgent intervention needed on her valvular disease given she is asymptomatic Close outpatient follow-up with repeat echocardiogram after SVT resolved  Chronic anemia Follows at the cancer center Transfusion several weeks ago Poor diet, anorexia may be contributing  Debility/weakness Will need PT   Long discussion with her concerning findings above, discussed follow-up, discussed valve disease, medications being used for cardiac arrhythmia Total encounter time 35 minutes  Greater than 50% was spent in counseling and coordination of care with the patient     Signed, Julien Nordmann, MD  11/25/2020, 12:05 PM

## 2020-11-25 NOTE — Telephone Encounter (Signed)
Please see MD r/s request and notify pt/pt's son of appts.

## 2020-11-26 ENCOUNTER — Inpatient Hospital Stay: Payer: Medicare HMO

## 2020-11-26 DIAGNOSIS — R0602 Shortness of breath: Secondary | ICD-10-CM

## 2020-11-26 DIAGNOSIS — F32A Depression, unspecified: Secondary | ICD-10-CM

## 2020-11-26 DIAGNOSIS — E43 Unspecified severe protein-calorie malnutrition: Secondary | ICD-10-CM | POA: Diagnosis not present

## 2020-11-26 DIAGNOSIS — I471 Supraventricular tachycardia: Secondary | ICD-10-CM | POA: Diagnosis not present

## 2020-11-26 DIAGNOSIS — N179 Acute kidney failure, unspecified: Secondary | ICD-10-CM | POA: Diagnosis not present

## 2020-11-26 DIAGNOSIS — U071 COVID-19: Secondary | ICD-10-CM | POA: Diagnosis not present

## 2020-11-26 LAB — COMPREHENSIVE METABOLIC PANEL
ALT: 16 U/L (ref 0–44)
AST: 26 U/L (ref 15–41)
Albumin: 2.5 g/dL — ABNORMAL LOW (ref 3.5–5.0)
Alkaline Phosphatase: 92 U/L (ref 38–126)
Anion gap: 12 (ref 5–15)
BUN: 57 mg/dL — ABNORMAL HIGH (ref 8–23)
CO2: 25 mmol/L (ref 22–32)
Calcium: 7.5 mg/dL — ABNORMAL LOW (ref 8.9–10.3)
Chloride: 115 mmol/L — ABNORMAL HIGH (ref 98–111)
Creatinine, Ser: 1.38 mg/dL — ABNORMAL HIGH (ref 0.44–1.00)
GFR, Estimated: 38 mL/min — ABNORMAL LOW (ref >60)
Glucose, Bld: 225 mg/dL — ABNORMAL HIGH (ref 70–99)
Potassium: 3.5 mmol/L (ref 3.5–5.1)
Sodium: 152 mmol/L — ABNORMAL HIGH (ref 135–145)
Total Bilirubin: 0.4 mg/dL (ref 0.3–1.2)
Total Protein: 6.8 g/dL (ref 6.5–8.1)

## 2020-11-26 LAB — GLUCOSE, CAPILLARY
Glucose-Capillary: 232 mg/dL — ABNORMAL HIGH (ref 70–99)
Glucose-Capillary: 218 mg/dL — ABNORMAL HIGH (ref 70–99)
Glucose-Capillary: 203 mg/dL — ABNORMAL HIGH (ref 70–99)
Glucose-Capillary: 174 mg/dL — ABNORMAL HIGH (ref 70–99)
Glucose-Capillary: 136 mg/dL — ABNORMAL HIGH (ref 70–99)

## 2020-11-26 LAB — C-REACTIVE PROTEIN: CRP: 6.4 mg/dL — ABNORMAL HIGH (ref <1.0)

## 2020-11-26 LAB — FIBRIN DERIVATIVES D-DIMER (ARMC ONLY): Fibrin derivatives D-dimer (ARMC): >7500 ng/mL (FEU) — ABNORMAL HIGH (ref 0.00–499.00)

## 2020-11-26 MED ORDER — AMIODARONE HCL 200 MG PO TABS
400.0000 mg | ORAL_TABLET | Freq: Two times a day (BID) | ORAL | Status: DC
  Administered 2020-11-26 – 2020-11-28 (×5): 400 mg via ORAL
  Filled 2020-11-26 (×5): qty 2

## 2020-11-26 MED ORDER — CEFUROXIME AXETIL 500 MG PO TABS
500.0000 mg | ORAL_TABLET | Freq: Every day | ORAL | Status: AC
  Administered 2020-11-26: 500 mg via ORAL
  Filled 2020-11-26: qty 1

## 2020-11-26 MED ORDER — AMIODARONE HCL 200 MG PO TABS: 200.0000 mg | ORAL_TABLET | Freq: Two times a day (BID) | ORAL | Status: DC

## 2020-11-26 MED ORDER — GLIPIZIDE 5 MG PO TABS
5.0000 mg | ORAL_TABLET | Freq: Every day | ORAL | Status: DC
  Administered 2020-11-27 – 2020-11-28 (×2): 5 mg via ORAL
  Filled 2020-11-26 (×2): qty 1

## 2020-11-26 MED ORDER — INSULIN DETEMIR 100 UNIT/ML ~~LOC~~ SOLN
12.0000 [IU] | Freq: Every day | SUBCUTANEOUS | Status: DC
  Administered 2020-11-27 – 2020-11-28 (×2): 12 [IU] via SUBCUTANEOUS
  Filled 2020-11-26 (×2): qty 0.12

## 2020-11-26 MED ORDER — AMIODARONE HCL 200 MG PO TABS: 200.0000 mg | ORAL_TABLET | Freq: Every day | ORAL | Status: DC

## 2020-11-26 NOTE — Evaluation (Signed)
Physical Therapy Evaluation Patient Details Name: Martha Butler MRN: [id number] DOB: [birth year] Today's Date: 11/26/2020   History of Present Illness  Martha Butler is an 86 y.o. female with history of chronic macrocytic anemia, neutropenia, hypertension and GERD.  At baseline, patient lives by self and is checked on daily by son.  Attempts to reach son at this time has been unsuccessful.  As per patient, over the past few days, she has felt extremely weak and fatigued with minimal exertion.  She also complained of shortness of breath. COVID+  Clinical Impression  Patient received in bed, flat affect, HOH. She initially declines oob activity stating "maybe later." With some encouragement patient agrees to get into recliner next to bed. She is mod independent with bed mobility, require min assist for sit to stand and to take a couple of steps from bed to chair. She requires B UE support to make transition. Patient will continue to benefit from skilled PT while here to improve strength and functional independence.         Follow Up Recommendations Home health PT;Supervision/Assistance - 24 hour    Equipment Recommendations  Rolling walker with 5" wheels    Recommendations for Other Services       Precautions / Restrictions Precautions Precautions: Fall Restrictions Weight Bearing Restrictions: No      Mobility  Bed Mobility Overal bed mobility: Modified Independent             General bed mobility comments: increased time and effort to perform. Low motivation.    Transfers Overall transfer level: Needs assistance Equipment used: 1 person hand held assist Transfers: Sit to/from [employer] Sit to Stand: Min assist Stand pivot transfers: Min assist       General transfer comment: Patient requires min assist to pivot to recliner with one hand on arm of chair.  Ambulation/Gait Ambulation/Gait assistance: Min assist Gait Distance (Feet): 2 Feet    Gait Pattern/deviations: Step-to pattern;Shuffle Gait velocity: decreased   General Gait Details: patient is unsteady with standing and pivoting to recliner. Requires UE support.  Stairs            Wheelchair Mobility    Modified Rankin (Stroke Patients Only)       Balance Overall balance assessment: Needs assistance Sitting-balance support: Feet supported Sitting balance-Leahy Scale: Good     Standing balance support: Bilateral upper extremity supported;During functional activity Standing balance-Leahy Scale: Poor Standing balance comment: reliant on UE support                             Pertinent Vitals/Pain Pain Assessment: No/denies pain    Home Living Family/patient expects to be discharged to:: Private residence Living Arrangements: Alone;Children Available Help at Discharge: Family;Available PRN/intermittently Type of Home: House         Home Equipment: None Additional Comments: Patient tells me she lives alone, son checks on her daily, she does not use walker at home. Dr. Allena Katz states her son lives with her.    Prior Function Level of Independence: Independent               Hand Dominance        Extremity/Trunk Assessment   Upper Extremity Assessment Upper Extremity Assessment: Generalized weakness    Lower Extremity Assessment Lower Extremity Assessment: Generalized weakness    Cervical / Trunk Assessment Cervical / Trunk Assessment: Normal  Communication   Communication: HOH  Cognition Arousal/Alertness:  Awake/alert Behavior During Therapy: WFL for tasks assessed/performed Overall Cognitive Status: No family/caregiver present to determine baseline cognitive functioning                                        General Comments      Exercises     Assessment/Plan    PT Assessment Patient needs continued PT services  PT Problem List Decreased mobility;Decreased strength;Decreased activity  tolerance;Decreased balance;Decreased cognition;Decreased safety awareness       PT Treatment Interventions Therapeutic activities;Gait training;Therapeutic exercise;Patient/family education;Functional mobility training;DME instruction    PT Goals (Current goals can be found in the Care Plan section)  Acute Rehab PT Goals Patient Stated Goal: none stated PT Goal Formulation: With patient Time For Goal Achievement: 12/10/20 Potential to Achieve Goals: Fair    Frequency Min 2X/week   Barriers to discharge        Co-evaluation               AM-PAC PT "6 Clicks" Mobility  Outcome Measure Help needed turning from your back to your side while in a flat bed without using bedrails?: A Little Help needed moving from lying on your back to sitting on the side of a flat bed without using bedrails?: A Little Help needed moving to and from a bed to a chair (including a wheelchair)?: A Lot Help needed standing up from a chair using your arms (e.g., wheelchair or bedside chair)?: A Little Help needed to walk in hospital room?: A Lot Help needed climbing 3-5 steps with a railing? : A Lot 6 Click Score: 15    End of Session Equipment Utilized During Treatment: Gait belt;Oxygen Activity Tolerance: Patient limited by lethargy Patient left: in chair;with call bell/phone within reach;with chair alarm set Nurse Communication: Mobility status PT Visit Diagnosis: Muscle weakness (generalized) (M62.81);Unsteadiness on feet (R26.81);Other abnormalities of gait and mobility (R26.89);Difficulty in walking, not elsewhere classified (R26.2)    Time: 1100-1119 PT Time Calculation (min) (ACUTE ONLY): 19 min   Charges:   PT Evaluation $PT Eval Moderate Complexity: 1 Mod PT Treatments $Therapeutic Activity: 8-22 mins        [employer], PT, G[employer] 11/26/20,12:08 PM

## 2020-11-26 NOTE — Progress Notes (Signed)
Progress Note  Patient Name: Martha Butler Date of Encounter: 11/26/2020  [facility] Cardiologist: New- Dr. Okey Dupre  Subjective   No complaints Telemetry reviewed, minimal SVT Has dramatically improved over the past 48 hours She is asymptomatic Poor appetite in general, this is a chronic issue Remains in bed  Inpatient Medications    Scheduled Meds: . amiodarone  400 mg Oral BID   Followed by  . [START ON 12/03/2020] amiodarone  200 mg Oral BID   Followed by  . [START ON 12/10/2020] amiodarone  200 mg Oral Daily  . aspirin EC  81 mg Oral Daily  . docusate sodium  100 mg Oral Daily  . enoxaparin (LOVENOX) injection  1 mg/kg Subcutaneous Q24H  . escitalopram  5 mg Oral Daily  . feeding supplement  237 mL Oral TID BM  . folic acid  1 mg Oral Daily  . [START ON 11/27/2020] glipiZIDE  5 mg Oral QAC breakfast  . insulin aspart  0-5 Units Subcutaneous QHS  . insulin aspart  0-9 Units Subcutaneous TID WC  . [START ON 11/27/2020] insulin detemir  12 Units Subcutaneous Daily  . methylPREDNISolone (SOLU-MEDROL) injection  60 mg Intravenous Daily  . multivitamin with minerals  1 tablet Oral Daily   Continuous Infusions: . dextrose 50 mL/hr at 11/26/20 1553  . remdesivir 100 mg in NS 100 mL 100 mg (11/26/20 1002)   PRN Meds: acetaminophen, senna-docusate   Vital Signs    Vitals:   11/26/20 0301 11/26/20 0821 11/26/20 1231 11/26/20 1600  BP: 125/67 132/70 122/68 (!) 105/55  Pulse: 82 80 74 70  Resp:   17   Temp: 97.7 F (36.5 C) 98 F (36.7 C) 98.4 F (36.9 C) 98 F (36.7 C)  TempSrc: Oral Oral Oral Oral  SpO2: (!) 87% 90% 92% 91%  Weight:      Height:        Intake/Output Summary (Last 24 hours) at 11/26/2020 1924 Last data filed at 11/26/2020 0528 Gross per 24 hour  Intake -  Output 0 ml  Net 0 ml   Last 3 Weights 11/25/2020 11/24/2020 11/10/2020  Weight (lbs) 129 lb 3 oz 129 lb 135 lb 12.8 oz  Weight (kg) 58.6 kg 58.514 kg 61.598 kg      Telemetry    Normal  sinus rhythm, minimal tachycardia, possibly 130-second run, personally reviewed  ECG    No new tracing obtained.- Personally Reviewed  Physical Exam   Constitutional: Very thin, oriented to person, place, and time. No distress.  HENT:  Head: Grossly normal Eyes:  no discharge. No scleral icterus.  Neck: No JVD, no carotid bruits  Cardiovascular: Regular rate and rhythm, no murmurs appreciated Pulmonary/Chest: Clear to auscultation bilaterally, no wheezes or rails Abdominal: Soft.  no distension.  no tenderness.  Musculoskeletal: Normal range of motion Neurological:  normal muscle tone. Coordination normal. No atrophy Skin: Skin warm and dry Psychiatric: normal affect, pleasant   Labs    High Sensitivity Troponin:   Recent Labs  Lab 11/23/20 0017  TROPONINIHS 189*      Chemistry Recent Labs  Lab 11/24/20 0356 11/25/20 0751 11/26/20 0651  NA 153* 155* 152*  K 4.1 4.0 3.5  CL 119* 121* 115*  CO2 22 22 25   GLUCOSE 144* 195* 225*  BUN 63* 63* 57*  CREATININE 1.53* 1.51* 1.38*  CALCIUM 7.2* 7.4* 7.5*  PROT 7.0 6.8 6.8  ALBUMIN 2.5* 2.6* 2.5*  AST 49* 34 26  ALT 21 18  16  ALKPHOS 60 78 92  BILITOT 0.4 0.5 0.4  GFRNONAA 34* 34* 38*  ANIONGAP 12 12 12      Hematology Recent Labs  Lab 11/22/20 1557 11/23/20 0404 11/24/20 0356  WBC 1.6*  1.5* 1.3* 1.3*  RBC 2.92*  2.97* 2.22* 2.45*  HGB 9.6*  9.7* 7.4* 7.9*  HCT 30.9*  31.4* 23.9* 26.2*  MCV 105.8*  105.7* 107.7* 106.9*  MCH 32.9  32.7 33.3 32.2  MCHC 31.1  30.9 31.0 30.2  RDW 16.7*  16.8* 16.9* 17.3*  PLT 267  269 203 345    BNPNo results for input(s): BNP, PROBNP in the last 168 hours.   DDimer No results for input(s): DDIMER in the last 168 hours.   Radiology    CT ANGIO CHEST PE W OR WO CONTRAST  Result Date: 11/25/2020 CLINICAL DATA:  COVID.  Fatigue. EXAM: CT ANGIOGRAPHY CHEST WITH CONTRAST TECHNIQUE: Multidetector CT imaging of the chest was performed using the standard protocol  during bolus administration of intravenous contrast. Multiplanar CT image reconstructions and MIPs were obtained to evaluate the vascular anatomy. CONTRAST:  58mL OMNIPAQUE IOHEXOL 350 MG/ML SOLN COMPARISON:  Chest x-ray 11/22/2020 FINDINGS: Cardiovascular: No filling defects in the pulmonary arteries to suggest pulmonary emboli. Mild cardiomegaly. No evidence of aortic aneurysm. Coronary artery calcifications. Mediastinum/Nodes: No mediastinal, hilar, or axillary adenopathy. Trachea and esophagus are unremarkable. Thyroid unremarkable. Lungs/Pleura: Extensive bilateral ground-glass airspace opacities compatible with COVID pneumonia. No effusions. Upper Abdomen: Imaging into the upper abdomen demonstrates no acute findings. Musculoskeletal: Chest wall soft tissues are unremarkable. No acute bony abnormality. Review of the MIP images confirms the above findings. IMPRESSION: Extensive bilateral airspace disease compatible with COVID pneumonia. No evidence of pulmonary embolus. Coronary artery disease. Electronically Signed   By: 11/24/2020 M.D.   On: 11/25/2020 17:19   11/27/2020 Venous Img Lower Bilateral (DVT)  Result Date: 11/26/2020 CLINICAL DATA:  Shortness of breath, edema EXAM: BILATERAL LOWER EXTREMITY VENOUS DOPPLER ULTRASOUND TECHNIQUE: Gray-scale sonography with graded compression, as well as color Doppler and duplex ultrasound were performed to evaluate the lower extremity deep venous systems from the level of the common femoral vein and including the common femoral, femoral, profunda femoral, popliteal and calf veins including the posterior tibial, peroneal and gastrocnemius veins when visible. The superficial great saphenous vein was also interrogated. Spectral Doppler was utilized to evaluate flow at rest and with distal augmentation maneuvers in the common femoral, femoral and popliteal veins. COMPARISON:  None. FINDINGS: RIGHT LOWER EXTREMITY Common Femoral Vein: No evidence of thrombus. Normal  compressibility, respiratory phasicity and response to augmentation. Saphenofemoral Junction: No evidence of thrombus. Normal compressibility and flow on color Doppler imaging. Profunda Femoral Vein: No evidence of thrombus. Normal compressibility and flow on color Doppler imaging. Femoral Vein: No evidence of thrombus. Normal compressibility, respiratory phasicity and response to augmentation. Popliteal Vein: No evidence of thrombus. Normal compressibility, respiratory phasicity and response to augmentation. Calf Veins: Limited assessment because of peripheral edema. No gross thrombus evident. LEFT LOWER EXTREMITY Common Femoral Vein: No evidence of thrombus. Normal compressibility, respiratory phasicity and response to augmentation. Saphenofemoral Junction: No evidence of thrombus. Normal compressibility and flow on color Doppler imaging. Profunda Femoral Vein: No evidence of thrombus. Normal compressibility and flow on color Doppler imaging. Femoral Vein: No evidence of thrombus. Normal compressibility, respiratory phasicity and response to augmentation. Popliteal Vein: No evidence of thrombus. Normal compressibility, respiratory phasicity and response to augmentation. Calf Veins: Limited assessment because of peripheral edema. No gross  thrombus evident. IMPRESSION: No significant femoropopliteal DVT in either extremity. Limited assessment of the calf veins because of peripheral edema. Electronically Signed   By: Judie Petit.  Shick M.D.   On: 11/26/2020 15:19    Cardiac Studies   Echo 11/24/2019 1. Left ventricular ejection fraction, by estimation, is >55%. Left  ventricular endocardial border not optimally defined to evaluate regional  wall motion. Left ventricular diastolic parameters are consistent with  Grade III diastolic dysfunction  (restrictive).  2. Pulmonary artery pressure is at least moderately elevated (40-45 mmHg  plus central venous pressure). Right ventricular systolic function was not  well  visualized. The right ventricular size is not well visualized.  3. The mitral valve was not well visualized. Moderate to severe mitral  valve regurgitation. No evidence of mitral stenosis.  4. Tricuspid valve regurgitation is moderate to severe.  5. The aortic valve was not well visualized. Aortic valve regurgitation  not assessed.  6. Pulmonic valve regurgitation not assessed.   Patient Profile     86 y.o. female with history of hypertension, diabetes presenting with shortness of breath and fatigue.  Diagnosed with COVID-19 pneumonia, hospital course complicated by SVT.  Assessment & Plan    1.  SVT Past 24 hours minimal arrhythmia Has been getting better over the past 48 hours now down to essentially nothing Amiodarone infusion weaned off, starting 400 twice daily 7 days then down to 200 twice daily then 200 daily, Outpatient follow-up  2.  COVID-19 pneumonia, acute hypoxic respiratory failure, pneumonia -Management as per primary team Still on nasal cannula,  Notes indicating 4 L, 91%   3.  Valvular heart disease Moderate to severe mitral valve regurgitation Moderate to severe tricuspid valve regurgitation Possibly dynamic regurg from cardiac arrhythmia Mildly elevated right heart pressures The above possibly exacerbated by SVT, which has now essentially resolved -Appears relatively euvolemic Outpatient follow-up, repeat echo as outpatient -At this time not no intervention needed on her valves and she is not a particularly good surgical candidate  Chronic anemia Follows at the cancer center Transfusion several weeks ago Poor diet, anorexia may be contributing -Discussed nutrition with her, reports she is just not hungry  Debility/weakness Will need PT Placement?  Long discussion with her concerning her nutrition, Heart arrhythmia, valvular disease  Total encounter time more than 35 minutes  Greater than 50% was spent in counseling and coordination of care with  the patient  [facility] will sign off.   Medication Recommendations: Amiodarone as above Other recommendations (labs, testing, etc): No further testing Follow up as an outpatient: Cardiology clinic for follow-up of her valvular heart disease and SVT     Signed, Julien Nordmann, MD  11/26/2020, 7:24 PM

## 2020-11-26 NOTE — Progress Notes (Signed)
[facility]  - [place] at [facility]   PATIENT NAME: Martha Butler    MR#:  [id number]  DATE OF BIRTH:  1935/08/15  SUBJECTIVE:   Trying to work with physical therapy. Did eat low breakfast. Denies any complaints. Remains in sinus rhythm. Some exertional shortness of breath. On amiodarone gtt REVIEW OF SYSTEMS:   Review of Systems  Constitutional: Positive for malaise/fatigue. Negative for chills, fever and weight loss.  HENT: Negative for ear discharge, ear pain and nosebleeds.   Eyes: Negative for blurred vision, pain and discharge.  Respiratory: Positive for shortness of breath. Negative for sputum production, wheezing and stridor.   Cardiovascular: Negative for chest pain, palpitations, orthopnea and PND.  Gastrointestinal: Negative for abdominal pain, diarrhea, nausea and vomiting.  Genitourinary: Negative for frequency and urgency.  Musculoskeletal: Negative for back pain and joint pain.  Neurological: Positive for weakness. Negative for sensory change, speech change and focal weakness.  Psychiatric/Behavioral: Negative for depression and hallucinations. The patient is not nervous/anxious.    Tolerating Diet:eating better Tolerating PT: HHPT  DRUG ALLERGIES:  No Known Allergies  VITALS:  Blood pressure 122/68, pulse 74, temperature 98.4 F (36.9 C), temperature source Oral, resp. rate 17, height 5\' 7"  (1.702 m), weight 58.6 kg, SpO2 92 %.  PHYSICAL EXAMINATION:   Physical Exam  GENERAL:  86 y.o.-year-old patient lying in the bed with no acute distress.  LUNGS: Normal breath sounds bilaterally, no wheezing, rales, rhonchi. No use of accessory muscles of respiration.  CARDIOVASCULAR: S1, S2 normal. No murmurs, rubs, or gallops.  ABDOMEN: Soft, nontender, nondistended. Bowel sounds present. No organomegaly or mass.  EXTREMITIES: No cyanosis, clubbing or edema b/l.    NEUROLOGIC: grossly nonfocal. Generalized weakness. PSYCHIATRIC:  patient is alert and  oriented x 2 SKIN: No obvious rash, lesion, or ulcer.   LABORATORY PANEL:  CBC Recent Labs  Lab 11/24/20 0356  WBC 1.3*  HGB 7.9*  HCT 26.2*  PLT 345    Chemistries  Recent Labs  Lab 11/22/20 2100 11/23/20 0404 11/26/20 0651  NA  --    < > 152*  K  --    < > 3.5  CL  --    < > 115*  CO2  --    < > 25  GLUCOSE  --    < > 225*  BUN  --    < > 57*  CREATININE  --    < > 1.38*  CALCIUM  --    < > 7.5*  MG 2.7*  --   --   AST  --    < > 26  ALT  --    < > 16  ALKPHOS  --    < > 92  BILITOT  --    < > 0.4   < > = values in this interval not displayed.   Cardiac Enzymes No results for input(s): TROPONINI in the last 168 hours. RADIOLOGY:  CT ANGIO CHEST PE W OR WO CONTRAST  Result Date: 11/25/2020 CLINICAL DATA:  COVID.  Fatigue. EXAM: CT ANGIOGRAPHY CHEST WITH CONTRAST TECHNIQUE: Multidetector CT imaging of the chest was performed using the standard protocol during bolus administration of intravenous contrast. Multiplanar CT image reconstructions and MIPs were obtained to evaluate the vascular anatomy. CONTRAST:  57mL OMNIPAQUE IOHEXOL 350 MG/ML SOLN COMPARISON:  Chest x-ray 11/22/2020 FINDINGS: Cardiovascular: No filling defects in the pulmonary arteries to suggest pulmonary emboli. Mild cardiomegaly. No evidence of aortic aneurysm. Coronary artery calcifications. Mediastinum/Nodes:  No mediastinal, hilar, or axillary adenopathy. Trachea and esophagus are unremarkable. Thyroid unremarkable. Lungs/Pleura: Extensive bilateral ground-glass airspace opacities compatible with COVID pneumonia. No effusions. Upper Abdomen: Imaging into the upper abdomen demonstrates no acute findings. Musculoskeletal: Chest wall soft tissues are unremarkable. No acute bony abnormality. Review of the MIP images confirms the above findings. IMPRESSION: Extensive bilateral airspace disease compatible with COVID pneumonia. No evidence of pulmonary embolus. Coronary artery disease. Electronically Signed   By:  Charlett Nose M.D.   On: 11/25/2020 17:19   ASSESSMENT AND PLAN:  Martha Butler is an 86 y.o. female with history of chronic macrocytic anemia, neutropenia, hypertension and GERD.  At baseline, patient lives by self and is checked on daily by son.  As per patient, over the past few days, she has felt extremely weak and fatigued with minimal exertion.  She also complained of shortness of breath.  Supraventricular tachycardia/Narrow complex Valvular heart disease   -Patient on amiodarone drip and HR much improved with few intermittent SVT --Seen by Dr Mariah Milling-- will switch to PO amiodarone per Dr. Windell Hummingbird recommendation from today. --Echo showed EF of more than 55%. Moderate to severe MR and TR.  Acute hypoxic respiratory failure secondary to COVID-19 pneumonia. -- Empirically put on antibiotics.   Pro calcitonin .61. Will change to oral from tomorrow if continues to show improvement. --Cont Solu-Medrol-- change to PO prednisone taper -- continue IV  Remdesivir.  Patient on 3 L nasal cannula.  Continue to watch inflammatory markers-- showing improvement. -- Wean oxygen as tolerated. Patient not on chronic oxygen at home. Patient will need oxygen to go home with  Acute kidney injury.  Failure to thrive/poor PO intake   Hypernatremia -Creatinine 2.06 on presentation and now down to 1.55.--1.51 - Continue IV fluids. -  Hold Cozaar  Hypernatremia and poor appetite.  Sodium still elevated 153  -continue IV fluid hydration with d5water and encourage oral fluids  Chronic anemia and chronic leukopenia.   --Continue to watch closely.   -- patient follows at the cancer center with Dr Cathie Hoops. According to the son she got blood transfusion about two weeks ago.  Type 2 diabetes mellitus with hemoglobin A1c of 6.5.   -Sugars elevated with steroids and IV D5 water will put on low-dose Lantus and sliding scale but likely can do diet control upon disposition -- start patient on glipizide 5 mg daily--  discussed with son Aurther Loft about it  Depression on Lexapro  Generalized weakness debility. Will have physical therapy see patient. According to the son at home patient does her own ADLs. She does not use cane or walker. No recent falls.  Nutrition Status: Nutrition Problem: Severe Malnutrition Etiology: social / environmental circumstances (advanced age) Signs/Symptoms: severe fat depletion,severe muscle depletion  follow dietitian recs   Family communication : son Neetu Carrozza on the phone today Consults : cardiology CODE STATUS: full-- discussed with son DVT Prophylaxis : enoxaparin Level of care: Progressive Cardiac Status is: Inpatient  Remains inpatient appropriate because:Inpatient level of care appropriate due to severity of illness   Dispo: The patient is from: Home              Anticipated d/c is to: Home               Anticipated d/c date is: likely tomorrow              Patient currently is not medically stable to d/c.   Difficult to place patient No  Will continue  to monitor one more day with SVT and sodium levels.     TOTAL TIME TAKING CARE OF THIS PATIENT: 25 minutes.  >50% time spent on counselling and coordination of care  Note: This dictation was prepared with Dragon dictation along with smaller phrase technology. Any transcriptional errors that result from this process are unintentional.  Enedina Finner M.D    [facility]   CC: Primary care physician; Enid Baas, MDPatient ID: Martha Butler, female   DOB: [birth year], 86 y.o.   MRN: [id number]

## 2020-11-27 DIAGNOSIS — U071 COVID-19: Secondary | ICD-10-CM | POA: Diagnosis not present

## 2020-11-27 DIAGNOSIS — N179 Acute kidney failure, unspecified: Secondary | ICD-10-CM | POA: Diagnosis not present

## 2020-11-27 DIAGNOSIS — R531 Weakness: Secondary | ICD-10-CM | POA: Diagnosis not present

## 2020-11-27 DIAGNOSIS — I471 Supraventricular tachycardia: Secondary | ICD-10-CM | POA: Diagnosis not present

## 2020-11-27 LAB — GLUCOSE, CAPILLARY
Glucose-Capillary: 110 mg/dL — ABNORMAL HIGH (ref 70–99)
Glucose-Capillary: 136 mg/dL — ABNORMAL HIGH (ref 70–99)
Glucose-Capillary: 208 mg/dL — ABNORMAL HIGH (ref 70–99)
Glucose-Capillary: 192 mg/dL — ABNORMAL HIGH (ref 70–99)

## 2020-11-27 LAB — CULTURE, BLOOD (ROUTINE X 2)
Culture: NO GROWTH
Culture: NO GROWTH
Special Requests: ADEQUATE
Special Requests: ADEQUATE

## 2020-11-27 LAB — COMPREHENSIVE METABOLIC PANEL
ALT: 15 U/L (ref 0–44)
AST: 21 U/L (ref 15–41)
Albumin: 2.4 g/dL — ABNORMAL LOW (ref 3.5–5.0)
Alkaline Phosphatase: 78 U/L (ref 38–126)
Anion gap: 11 (ref 5–15)
BUN: 51 mg/dL — ABNORMAL HIGH (ref 8–23)
CO2: 23 mmol/L (ref 22–32)
Calcium: 7.5 mg/dL — ABNORMAL LOW (ref 8.9–10.3)
Chloride: 118 mmol/L — ABNORMAL HIGH (ref 98–111)
Creatinine, Ser: 1.29 mg/dL — ABNORMAL HIGH (ref 0.44–1.00)
GFR, Estimated: 41 mL/min — ABNORMAL LOW (ref >60)
Glucose, Bld: 121 mg/dL — ABNORMAL HIGH (ref 70–99)
Potassium: 3.4 mmol/L — ABNORMAL LOW (ref 3.5–5.1)
Sodium: 152 mmol/L — ABNORMAL HIGH (ref 135–145)
Total Bilirubin: 0.6 mg/dL (ref 0.3–1.2)
Total Protein: 6.1 g/dL — ABNORMAL LOW (ref 6.5–8.1)

## 2020-11-27 LAB — C-REACTIVE PROTEIN: CRP: 6 mg/dL — ABNORMAL HIGH (ref <1.0)

## 2020-11-27 LAB — FIBRIN DERIVATIVES D-DIMER (ARMC ONLY): Fibrin derivatives D-dimer (ARMC): 7156.23 ng/mL (FEU) — ABNORMAL HIGH (ref 0.00–499.00)

## 2020-11-27 MED ORDER — PREDNISONE 50 MG PO TABS
50.0000 mg | ORAL_TABLET | Freq: Every day | ORAL | Status: DC
  Administered 2020-11-28: 50 mg via ORAL
  Filled 2020-11-27: qty 1

## 2020-11-27 MED ORDER — DEXTROSE-NACL 5-0.45 % IV SOLN: INTRAVENOUS | Status: DC

## 2020-11-27 MED ORDER — FREE WATER
200.0000 mL | Status: DC
  Administered 2020-11-27 – 2020-11-28 (×5): 200 mL via ORAL

## 2020-11-27 NOTE — Progress Notes (Addendum)
[facility]  - [place] at [facility]   PATIENT NAME: Martha Butler    MR#:  [id number]  DATE OF BIRTH:  June 20, 1935  SUBJECTIVE:   Patient awake alert resting quietly. No issues per RN. She has poor fluid intake. Receiving D5 water.  REVIEW OF SYSTEMS:   Review of Systems  Constitutional: Positive for malaise/fatigue. Negative for chills, fever and weight loss.  HENT: Negative for ear discharge, ear pain and nosebleeds.   Eyes: Negative for blurred vision, pain and discharge.  Respiratory: Positive for shortness of breath. Negative for sputum production, wheezing and stridor.   Cardiovascular: Negative for chest pain, palpitations, orthopnea and PND.  Gastrointestinal: Negative for abdominal pain, diarrhea, nausea and vomiting.  Genitourinary: Negative for frequency and urgency.  Musculoskeletal: Negative for back pain and joint pain.  Neurological: Positive for weakness. Negative for sensory change, speech change and focal weakness.  Psychiatric/Behavioral: Negative for depression and hallucinations. The patient is not nervous/anxious.    Tolerating Diet:eating some Tolerating PT: HHPT  DRUG ALLERGIES:  No Known Allergies  VITALS:  Blood pressure (!) 104/54, pulse 65, temperature 98 F (36.7 C), temperature source Axillary, resp. rate 18, height 5\' 7"  (1.702 m), weight 58.6 kg, SpO2 100 %.  PHYSICAL EXAMINATION:   Physical Exam  GENERAL:  86 y.o.-year-old patient lying in the bed with no acute distress. Thin cachectic LUNGS: Normal breath sounds bilaterally, no wheezing, rales, rhonchi. No use of accessory muscles of respiration.  CARDIOVASCULAR: S1, S2 normal. No murmurs, rubs, or gallops.  ABDOMEN: Soft, nontender, nondistended. Bowel sounds present. No organomegaly or mass.  EXTREMITIES: No cyanosis, clubbing or edema b/l.    NEUROLOGIC: grossly nonfocal. Generalized weakness. PSYCHIATRIC:  patient is alert and oriented x 2 SKIN: No obvious rash,  lesion, or ulcer.   LABORATORY PANEL:  CBC Recent Labs  Lab 11/24/20 0356  WBC 1.3*  HGB 7.9*  HCT 26.2*  PLT 345    Chemistries  Recent Labs  Lab 11/22/20 2100 11/23/20 0404 11/27/20 0600  NA  --    < > 152*  K  --    < > 3.4*  CL  --    < > 118*  CO2  --    < > 23  GLUCOSE  --    < > 121*  BUN  --    < > 51*  CREATININE  --    < > 1.29*  CALCIUM  --    < > 7.5*  MG 2.7*  --   --   AST  --    < > 21  ALT  --    < > 15  ALKPHOS  --    < > 78  BILITOT  --    < > 0.6   < > = values in this interval not displayed.   Cardiac Enzymes No results for input(s): TROPONINI in the last 168 hours. RADIOLOGY:  CT ANGIO CHEST PE W OR WO CONTRAST  Result Date: 11/25/2020 CLINICAL DATA:  COVID.  Fatigue. EXAM: CT ANGIOGRAPHY CHEST WITH CONTRAST TECHNIQUE: Multidetector CT imaging of the chest was performed using the standard protocol during bolus administration of intravenous contrast. Multiplanar CT image reconstructions and MIPs were obtained to evaluate the vascular anatomy. CONTRAST:  44mL OMNIPAQUE IOHEXOL 350 MG/ML SOLN COMPARISON:  Chest x-ray 11/22/2020 FINDINGS: Cardiovascular: No filling defects in the pulmonary arteries to suggest pulmonary emboli. Mild cardiomegaly. No evidence of aortic aneurysm. Coronary artery calcifications. Mediastinum/Nodes: No mediastinal, hilar, or axillary  adenopathy. Trachea and esophagus are unremarkable. Thyroid unremarkable. Lungs/Pleura: Extensive bilateral ground-glass airspace opacities compatible with COVID pneumonia. No effusions. Upper Abdomen: Imaging into the upper abdomen demonstrates no acute findings. Musculoskeletal: Chest wall soft tissues are unremarkable. No acute bony abnormality. Review of the MIP images confirms the above findings. IMPRESSION: Extensive bilateral airspace disease compatible with COVID pneumonia. No evidence of pulmonary embolus. Coronary artery disease. Electronically Signed   By: Charlett Nose M.D.   On: 11/25/2020  17:19   US Venous Img Lower Bilateral (DVT)  Result Date: 11/26/2020 CLINICAL DATA:  Shortness of breath, edema EXAM: BILATERAL LOWER EXTREMITY VENOUS DOPPLER ULTRASOUND TECHNIQUE: Gray-scale sonography with graded compression, as well as color Doppler and duplex ultrasound were performed to evaluate the lower extremity deep venous systems from the level of the common femoral vein and including the common femoral, femoral, profunda femoral, popliteal and calf veins including the posterior tibial, peroneal and gastrocnemius veins when visible. The superficial great saphenous vein was also interrogated. Spectral Doppler was utilized to evaluate flow at rest and with distal augmentation maneuvers in the common femoral, femoral and popliteal veins. COMPARISON:  None. FINDINGS: RIGHT LOWER EXTREMITY Common Femoral Vein: No evidence of thrombus. Normal compressibility, respiratory phasicity and response to augmentation. Saphenofemoral Junction: No evidence of thrombus. Normal compressibility and flow on color Doppler imaging. Profunda Femoral Vein: No evidence of thrombus. Normal compressibility and flow on color Doppler imaging. Femoral Vein: No evidence of thrombus. Normal compressibility, respiratory phasicity and response to augmentation. Popliteal Vein: No evidence of thrombus. Normal compressibility, respiratory phasicity and response to augmentation. Calf Veins: Limited assessment because of peripheral edema. No gross thrombus evident. LEFT LOWER EXTREMITY Common Femoral Vein: No evidence of thrombus. Normal compressibility, respiratory phasicity and response to augmentation. Saphenofemoral Junction: No evidence of thrombus. Normal compressibility and flow on color Doppler imaging. Profunda Femoral Vein: No evidence of thrombus. Normal compressibility and flow on color Doppler imaging. Femoral Vein: No evidence of thrombus. Normal compressibility, respiratory phasicity and response to augmentation. Popliteal  Vein: No evidence of thrombus. Normal compressibility, respiratory phasicity and response to augmentation. Calf Veins: Limited assessment because of peripheral edema. No gross thrombus evident. IMPRESSION: No significant femoropopliteal DVT in either extremity. Limited assessment of the calf veins because of peripheral edema. Electronically Signed   By: Judie Petit.  Shick M.D.   On: 11/26/2020 15:19   ASSESSMENT AND PLAN:  Martha Butler is an 86 y.o. female with history of chronic macrocytic anemia, neutropenia, hypertension and GERD.  At baseline, patient lives by self and is checked on daily by son.  As per patient, over the past few days, she has felt extremely weak and fatigued with minimal exertion.  She also complained of shortness of breath.  Supraventricular tachycardia/Narrow complex Valvular heart disease   -Patient on amiodarone drip and HR much improved with few intermittent SVT --Seen by Dr Mariah Milling-- will switch to PO amiodarone per Dr. Windell Hummingbird recommendation   --Echo showed EF of more than 55%. Moderate to severe MR and TR.  Acute hypoxic respiratory failure secondary to COVID-19 pneumonia. -- Empirically put on antibiotics.   Pro calcitonin .61. Will change to oral from tomorrow if continues to show improvement. --Cont Solu-Medrol-- change to PO prednisone taper -- completed  Remdesivir.  Patient on 2 L nasal cannula.   -- Wean oxygen as tolerated. Patient not on chronic oxygen at home. Patient will need oxygen to go home.  Acute kidney injury.  Failure to thrive/poor PO intake   Hypernatremia --  Creatinine 2.06 on presentation and now down to 1.55.--1.51--1.29 --Continue IV fluids-- change to D5 water with half normal saline. Trying to give her free water 200 mL Q ID. Not sure how much he is gonna drink. -- Sodium is 152 for last two days. She overall appears stable. This was discussed with patient's son Aurther Loft on the phone. -  Hold Cozaar  Chronic anemia and chronic leukopenia.   --  patient follows at the cancer center with Dr Cathie Hoops. According to the son she got blood transfusion about two weeks ago.  Type 2 diabetes mellitus with hemoglobin A1c of 6.5.   -Sugars elevated with steroids and IV D5 water will put on low-dose Lantus and sliding scale but likely can do diet control upon disposition -- start patient on glipizide 5 mg daily-- discussed with son Aurther Loft about it  Depression on Lexapro  Generalized weakness debility.  -- physical therapy rec HHPT --According to the son at home patient does her own ADLs. She does not use cane or walker. No recent falls.  Nutrition Status: Nutrition Problem: Severe Malnutrition Etiology: social / environmental circumstances (advanced age) Signs/Symptoms: severe fat depletion,severe muscle depletion  follow dietitian recs   Family communication : son Kahliyah Dick on the phone today 1/29 Consults : cardiology CODE STATUS: full-- discussed with son DVT Prophylaxis : enoxaparin Level of care: Progressive Cardiac Status is: Inpatient  Remains inpatient appropriate because:Inpatient level of care appropriate due to severity of illness   Dispo: The patient is from: Home              Anticipated d/c is to: Home               Anticipated d/c date is: likely tomorrow 1/29              Patient currently is not medically stable to d/c.   Difficult to place patient No changed her fluids to D5 half normal saline and giving free water to drink. Will monitor sodium one more day. If continues to remain flat will discharge home and outpatient follow-up with PCP. Son Aurther Loft agrees with the plan.     TOTAL TIME TAKING CARE OF THIS PATIENT: 25 minutes.  >50% time spent on counselling and coordination of care  Note: This dictation was prepared with Dragon dictation along with smaller phrase technology. Any transcriptional errors that result from this process are unintentional.  Enedina Finner M.D    [facility]   CC: Primary  care physician; Enid Baas, MDPatient ID: Martha Butler, female   DOB: 11/04/1934, 86 y.o.   MRN: [id number]

## 2020-11-27 NOTE — TOC Initial Note (Signed)
Transition of Care [facility]) - Initial/Assessment Note    Patient Details  Name: Martha Butler MRN: [id number] Date of Birth: 12-24-1934  [facility]) CM/SW Contact:    Delphia Grates, LCSW Phone Number: 11/27/2020, 2:55 PM  Clinical Narrative:                  86 y.o.femalewith history of chronic macrocytic anemia, neutropenia,hypertension and GERD. Patient is suffering from some weakness. CSW contacted patient's son. Patient's son stated that he has not seen her in a week but she lives alone and he checks on her daily. Patient reports that the doctor updated him on his mother's care. Patient's son agrees with home health, DME need, and palliative outpatient.   CSW was able to get patient in with [facility] for [facility] PT and RN once discharged CSW contacted [employer] to obtain DME. Velna Hatchet with [employer] stated that a CSW needs to deliver the equipment to the patient's room and fill out a form (CSW left info on handoff for Sunday CSW worker).  CSW attempted to call [facility] for palliative outpatient. They stated that they will call back but did not. CSW will continue to try to get ahold of this service before discharge on Sunday, 1/30.  PCP: Enid Baas, MD Pharmacy: [facility]  Expected Discharge Plan: Home w Home Health Services Barriers to Discharge: Continued Medical Work up   Patient Goals and CMS Choice Patient states their goals for this hospitalization and ongoing recovery are:: Patient's son stated, "get her strength back" CMS Medicare.gov Compare Post Acute Care list provided to:: Patient Represenative (must comment) (Patient's son, Aurther Loft) Choice offered to / list presented to : Sibling  Expected Discharge Plan and Services Expected Discharge Plan: Home w Home Health Services     Post Acute Care Choice: Durable Medical Equipment,Home Health Living arrangements for the past 2 months: Single Family Home                 DME Arranged: Walker  rolling DME Agency: [employer] Date DME Agency Contacted: 11/27/20 Time DME Agency Contacted: 1408 Representative spoke with at DME Agency: Velna Hatchet HH Arranged: RN,PT HH Agency: [facility] Date [facility] Agency Contacted: 11/27/20 Time HH Agency Contacted: 1445 Representative spoke with at [facility] Agency: Enrique Sack  Prior Living Arrangements/Services Living arrangements for the past 2 months: Single Family Home Lives with:: Self Patient language and need for interpreter reviewed:: Yes Do you feel safe going back to the place where you live?: Yes      Need for Family Participation in Patient Care: Yes (Comment) (Patient's son) Care giver support system in place?: Yes (comment) (Patient's son)   Criminal Activity/Legal Involvement Pertinent to Current Situation/Hospitalization: No - Comment as needed  Activities of Daily Living Home Assistive Devices/Equipment: None ADL Screening (condition at time of admission) Patient's cognitive ability adequate to safely complete daily activities?: No Is the patient deaf or have difficulty hearing?: Yes Does the patient have difficulty seeing, even when wearing glasses/contacts?: Yes Does the patient have difficulty concentrating, remembering, or making decisions?: No Patient able to express need for assistance with ADLs?: Yes Does the patient have difficulty dressing or bathing?: Yes Independently performs ADLs?: No Communication: Independent Dressing (OT): Needs assistance Is this a change from baseline?: Change from baseline, expected to last <3days Grooming: Needs assistance Is this a change from baseline?: Change from baseline, expected to last <3 days Feeding: Independent Bathing: Needs assistance Is this a change from baseline?: Change from baseline, expected  to last <3 days Toileting: Needs assistance Is this a change from baseline?: Change from baseline, expected to last <3 days In/Out Bed: Needs assistance Is this a change from baseline?:  Change from baseline, expected to last <3 days Walks in Home: Needs assistance Is this a change from baseline?: Change from baseline, expected to last <3 days Does the patient have difficulty walking or climbing stairs?: Yes Weakness of Legs: Both Weakness of Arms/Hands: Both  Permission Sought/Granted Permission sought to share information with : Family Supports Permission granted to share information with : Yes, Release of Information Signed  Share Information with NAME: Rayetta Veith     Permission granted to share info w Relationship: Son  Permission granted to share info w Contact Information: [phone]  Emotional Assessment Appearance:: Other (Comment Required (Unable to assess) Attitude/Demeanor/Rapport: Unable to Assess Affect (typically observed): Unable to Assess Orientation: : Oriented to Self,Oriented to Situation,Oriented to Place,Oriented to  Time Alcohol / Substance Use: Not Applicable Psych Involvement: No (comment)  Admission diagnosis:  SVT (supraventricular tachycardia) (HCC) [I47.1] New onset a-fib (HCC) [I48.91] AKI (acute kidney injury) (HCC) [N17.9] Pneumonia due to COVID-19 virus [U07.1, J12.82] Patient Active Problem List   Diagnosis Date Noted  . Protein-calorie malnutrition, severe 11/25/2020  . Acute respiratory failure with hypoxia (HCC)   . Pneumonia due to COVID-19 virus   . AKI (acute kidney injury) (HCC)   . Hypernatremia   . Type 2 diabetes mellitus with hyperglycemia, without long-term current use of insulin (HCC)   . SVT (supraventricular tachycardia) (HCC) 11/22/2020  . Goals of care, counseling/discussion 09/14/2020  . Neutropenia (HCC) 09/14/2020  . Unintentional weight loss 09/14/2020  . Anemia of chronic disease 09/14/2020  . Anemia, unspecified 10/26/2016  . Depression 06/29/2016  . Benign essential hypertension 06/29/2016  . Caregiver stress 06/29/2016   PCP:  Enid Baas, MD Pharmacy:   [facility], [place] - [address] [place] [place] [address] Phone: [phone] Fax: [phone]     Social Determinants of Health (SDOH) Interventions    Readmission Risk Interventions No flowsheet data found.

## 2020-11-27 NOTE — Plan of Care (Signed)
  Problem: Clinical Measurements: Goal: Will remain free from infection Outcome: Progressing   Problem: Clinical Measurements: Goal: Respiratory complications will improve Outcome: Progressing   Problem: Clinical Measurements: Goal: Cardiovascular complication will be avoided Outcome: Progressing   Problem: Safety: Goal: Ability to remain free from injury will improve Outcome: Progressing   

## 2020-11-28 DIAGNOSIS — N179 Acute kidney failure, unspecified: Secondary | ICD-10-CM | POA: Diagnosis not present

## 2020-11-28 DIAGNOSIS — E43 Unspecified severe protein-calorie malnutrition: Secondary | ICD-10-CM | POA: Diagnosis not present

## 2020-11-28 DIAGNOSIS — I471 Supraventricular tachycardia: Secondary | ICD-10-CM | POA: Diagnosis not present

## 2020-11-28 DIAGNOSIS — U071 COVID-19: Secondary | ICD-10-CM | POA: Diagnosis not present

## 2020-11-28 DIAGNOSIS — D709 Neutropenia, unspecified: Secondary | ICD-10-CM

## 2020-11-28 LAB — CBC
HCT: 23.2 % — ABNORMAL LOW (ref 36.0–46.0)
Hemoglobin: 7.5 g/dL — ABNORMAL LOW (ref 12.0–15.0)
MCH: 33.6 pg (ref 26.0–34.0)
MCHC: 32.3 g/dL (ref 30.0–36.0)
MCV: 104 fL — ABNORMAL HIGH (ref 80.0–100.0)
Platelets: 421 10*3/uL — ABNORMAL HIGH (ref 150–400)
RBC: 2.23 MIL/uL — ABNORMAL LOW (ref 3.87–5.11)
RDW: 17.6 % — ABNORMAL HIGH (ref 11.5–15.5)
WBC: 0.6 10*3/uL — CL (ref 4.0–10.5)
nRBC: 8.5 % — ABNORMAL HIGH (ref 0.0–0.2)

## 2020-11-28 LAB — SODIUM: Sodium: 150 mmol/L — ABNORMAL HIGH (ref 135–145)

## 2020-11-28 LAB — GLUCOSE, CAPILLARY
Glucose-Capillary: 115 mg/dL — ABNORMAL HIGH (ref 70–99)
Glucose-Capillary: 92 mg/dL (ref 70–99)

## 2020-11-28 MED ORDER — PREDNISONE 10 MG PO TABS: ORAL_TABLET | ORAL | 0 refills | Status: AC

## 2020-11-28 MED ORDER — AMIODARONE HCL 400 MG PO TABS: 400.0000 mg | ORAL_TABLET | Freq: Two times a day (BID) | ORAL | 0 refills | Status: AC

## 2020-11-28 MED ORDER — AMIODARONE HCL 200 MG PO TABS: 200.0000 mg | ORAL_TABLET | Freq: Every day | ORAL | 1 refills | Status: AC

## 2020-11-28 MED ORDER — ADULT MULTIVITAMIN W/MINERALS CH: 1.0000 | ORAL_TABLET | Freq: Every day | ORAL | 0 refills | Status: AC

## 2020-11-28 MED ORDER — AMIODARONE HCL 200 MG PO TABS: 200.0000 mg | ORAL_TABLET | Freq: Two times a day (BID) | ORAL | 0 refills | Status: AC

## 2020-11-28 MED ORDER — GLIPIZIDE 5 MG PO TABS: 5.0000 mg | ORAL_TABLET | Freq: Every day | ORAL | 0 refills | Status: AC

## 2020-11-28 MED ORDER — BLOOD GLUCOSE MONITOR KIT: PACK | 0 refills | Status: AC

## 2020-11-28 NOTE — Discharge Summary (Addendum)
[facility] NAME: Martha Butler    MR#:  [id number]  DATE OF BIRTH:  1935/04/07  DATE OF ADMISSION:  11/22/2020 ADMITTING PHYSICIAN: Loletha Grayer, MD  DATE OF DISCHARGE: 11/28/2020  PRIMARY CARE PHYSICIAN: Gladstone Lighter, MD    ADMISSION DIAGNOSIS:  SVT (supraventricular tachycardia) (HCC) [I47.1] New onset a-fib (HCC) [I48.91] AKI (acute kidney injury) ([facility]) [N17.9] Pneumonia due to COVID-19 virus [U07.1, J12.82]  DISCHARGE DIAGNOSIS:  acute hypoxic respiratory failure secondary to COVID-19 pneumonia hypoxia resolved supraventricular tachycardia  acute renal failure/poor PO intake/hypernatremia-- improving slowly chronic neutropenia chronic anemia-- followed the cancer center SECONDARY DIAGNOSIS:   Past Medical History:  Diagnosis Date   Depression    GERD (gastroesophageal reflux disease)    Hypertension    Leukopenia    Macrocytic anemia     HOSPITAL COURSE:  Martha Butler an 86 y.o.femalewith history of chronic macrocytic anemia, neutropenia,hypertension and GERD. At baseline, patient lives by self and is checked on daily by son. As per patient, over the past few days, she has felt extremely weak and fatigued with minimal exertion. She also complained of shortness of breath.  Supraventricular tachycardia/Narrow complex Valvular heart disease  -Patient on amiodarone drip and HR much improved with few intermittent SVT --Seen by Dr Rockey Situ-- will switch to PO amiodarone per Dr. Donivan Scull recommendation   --Echo showed EF of more than 55%. Moderate to severe MR and TR. --Tele--NSR now --pt will f/u [facility] cardiology  Acute hypoxic respiratory failure secondary to COVID-19 pneumonia. --Empirically was on antibiotics.  Pro calcitonin .61. --Cont Solu-Medrol-- change to PO prednisone taper -- completed Remdesivir. Patient was on 2 L nasal cannula. -- Weaned oxygen to RA. 99% ON RA  today --Sepsis ruled out  Acute kidney injury (baseline creat 0.9) Failure to thrive/poor PO intake  Hypernatremia --Creatinine 2.06 on presentation and now down to 1.55.--1.51--1.29 --Continue IV fluids-- changed to D5 water with half normal saline. Trying to give her free water 200 mL Q ID. Not sure how much he is gonna drink. -- Sodium is 152 for last two days. She overall appears stable. This was discussed with patient's son Coralyn Mark on the phone. --Hold Cozaar due to relative low BP --1/30--sodium 150--pt's son encouraged to give free water as tlerated. --Labs can be followed by PCP  Chronic anemia and chronic leukopenia.  -- patient follows at the cancer center with Dr Tasia Catchings. According to the son she got blood transfusion about two weeks ago. -- Outpatient oncology notes reviewed. Patient does not want aggressive workup done. --WBC 0.6--curb sided on call oncologist. Since patient is otherwise hemodynamically stable and no fever okay to follow-up with Dr. Tasia Catchings as outpatient. Son was informed of the labs and asked to monitor fever at home and called Dr. Tasia Catchings as needed.  Type 2 diabetes mellitus with hemoglobin A1c of 6.5.  -Sugars elevated with steroids and IV D5 water will put on low-dose Lantus and sliding scale  -- start patient on glipizide 5 mg daily-- discussed with son Coralyn Mark about it. He will keep sugar log at home. Discussed results of sugars with PCP.  Depression on Lexapro  Generalized weakness debility.  -- physical therapy rec HHPT --According to the son at home patient does her own ADLs. She does not use cane or walker. No recent falls.  Nutrition Status: Nutrition Problem: Severe Malnutrition Etiology: social / environmental circumstances (advanced age) Signs/Symptoms: severe fat depletion,severe muscle depletion  follow dietitian recs -- can  you ensure at home  Patient is wanting to go home for last few days. She said this is the best at baseline she can be.  Discussed overall long term prognosis and medical issues with son who voiced understanding.   I have Arranged outpatient palliative care given multiple comorbidities and high mortality index score.   Family communication : son Semaj Kham on the phone today 1/30 Consults : cardiology CODE STATUS: full-- discussed with son DVT Prophylaxis : enoxaparin Level of care: Progressive Cardiac Status is: Inpatient  Remains inpatient appropriate because:Inpatient level of care appropriate due to severity of illness   Dispo: The patient is from: Home  Anticipated d/c is to: Home   Anticipated d/c date is: today  Patient currently is medically best for discharge               Difficult to place patient No    CONSULTS OBTAINED:    DRUG ALLERGIES:  No Known Allergies  DISCHARGE MEDICATIONS:   Allergies as of 11/28/2020   No Known Allergies     Medication List    STOP taking these medications   ciprofloxacin 250 MG tablet Commonly known as: CIPRO   losartan 50 MG tablet Commonly known as: COZAAR     TAKE these medications   amiodarone 400 MG tablet Commonly known as: PACERONE Take 1 tablet (400 mg total) by mouth 2 (two) times daily.   amiodarone 200 MG tablet Commonly known as: PACERONE Take 1 tablet (200 mg total) by mouth 2 (two) times daily. Start taking on: December 03, 2020   amiodarone 200 MG tablet Commonly known as: PACERONE Take 1 tablet (200 mg total) by mouth daily. Start taking on: December 10, 2020   aspirin EC 81 MG tablet Take 1 tablet by mouth daily.   blood glucose meter kit and supplies Kit Dispense based on patient and insurance preference. Use up to four times daily as directed. (FOR ICD-9 250.00, 250.01).   docusate sodium 100 MG capsule Commonly known as: Colace Take 1 capsule (100 mg total) by mouth daily. Do not take if you have loose stools   escitalopram 5 MG tablet Commonly known as:  LEXAPRO Take 5 mg by mouth daily.   glipiZIDE 5 MG tablet Commonly known as: GLUCOTROL Take 1 tablet (5 mg total) by mouth daily before breakfast. Start taking on: November 29, 2020   multivitamin with minerals Tabs tablet Take 1 tablet by mouth daily. Start taking on: November 29, 2020   predniSONE 10 MG tablet Commonly known as: DELTASONE Take 40 mg daily --taper by 10 mg daily then stop Start taking on: November 29, 2020            Durable Medical Equipment  (From admission, onward)         Start     Ordered   11/28/20 0953  For home use only DME Walker rolling  Once       Question Answer Comment  Walker: With 5 Inch Wheels   Patient needs a walker to treat with the following condition General weakness      11/28/20 0952          If you experience worsening of your admission symptoms, develop shortness of breath, life threatening emergency, suicidal or homicidal thoughts you must seek medical attention immediately by calling 911 or calling your MD immediately  if symptoms less severe.  You Must read complete instructions/literature along with all the possible adverse reactions/side effects for all the  Medicines you take and that have been prescribed to you. Take any new Medicines after you have completely understood and accept all the possible adverse reactions/side effects.   Please note  You were cared for by a hospitalist during your hospital stay. If you have any questions about your discharge medications or the care you received while you were in the hospital after you are discharged, you can call the unit and asked to speak with the hospitalist on call if the hospitalist that took care of you is not available. Once you are discharged, your primary care physician will handle any further medical issues. Please note that NO REFILLS for any discharge medications will be authorized once you are discharged, as it is imperative that you return to your primary care  physician (or establish a relationship with a primary care physician if you do not have one) for your aftercare needs so that they can reassess your need for medications and monitor your lab values. Today   SUBJECTIVE   No new complaints. No new issues per RN. She did drink some water according to the nurse.  VITAL SIGNS:  Blood pressure (!) 105/56, pulse 70, temperature 98 F (36.7 C), temperature source Oral, resp. rate 18, height 5' 7"  (1.702 m), weight 58.7 kg, SpO2 99 %.  I/O:    Intake/Output Summary (Last 24 hours) at 11/28/2020 1039 Last data filed at 11/28/2020 9476 Gross per 24 hour  Intake 1433.23 ml  Output 500 ml  Net 933.23 ml    PHYSICAL EXAMINATION:  GENERAL:  86 y.o.-year-old patient lying in the bed with no acute distress. Thin cachectic LUNGS: Normal breath sounds bilaterally, no wheezing, rales, rhonchi. No use of accessory muscles of respiration.  CARDIOVASCULAR: S1, S2 normal. No murmurs, rubs, or gallops.  ABDOMEN: Soft, nontender, nondistended. Bowel sounds present. No organomegaly or mass.  EXTREMITIES: No cyanosis, clubbing or edema b/l.    NEUROLOGIC: grossly nonfocal. Generalized weakness. PSYCHIATRIC:  patient is alert and oriented x 2 SKIN: No obvious rash, lesion, or ulcer.   DATA REVIEW:   CBC  Recent Labs  Lab 11/28/20 0349  WBC 0.6*  HGB 7.5*  HCT 23.2*  PLT 421*    Chemistries  Recent Labs  Lab 11/22/20 2100 11/23/20 0404 11/27/20 0600 11/28/20 0349  NA  --    < > 152* 150*  K  --    < > 3.4*  --   CL  --    < > 118*  --   CO2  --    < > 23  --   GLUCOSE  --    < > 121*  --   BUN  --    < > 51*  --   CREATININE  --    < > 1.29*  --   CALCIUM  --    < > 7.5*  --   MG 2.7*  --   --   --   AST  --    < > 21  --   ALT  --    < > 15  --   ALKPHOS  --    < > 78  --   BILITOT  --    < > 0.6  --    < > = values in this interval not displayed.    Microbiology Results   Recent Results (from the past 240 hour(s))  Urine  culture     Status: Abnormal   Collection Time: 11/22/20  3:05  PM   Specimen: Urine, Random  Result Value Ref Range Status   Specimen Description   Final    URINE, RANDOM Performed at [facility], [address] [place], [place] [address]    Special Requests   Final    NONE Performed at [facility], [address] [place], [place] [address]    Culture (A)  Final    <10,000 COLONIES/mL INSIGNIFICANT GROWTH Performed at [facility] [address] [place], [place] [address]    Report Status 11/25/2020 FINAL  Final  Blood Culture (routine x 2)     Status: None   Collection Time: 11/22/20  4:49 PM   Specimen: BLOOD  Result Value Ref Range Status   Specimen Description BLOOD BLOOD LEFT ARM  Final   Special Requests   Final    BOTTLES DRAWN AEROBIC AND ANAEROBIC Blood Culture adequate volume   Culture   Final    NO GROWTH 5 DAYS Performed at [facility], [address], [place], [place] [address]    Report Status 11/27/2020 FINAL  Final  Blood Culture (routine x 2)     Status: None   Collection Time: 11/22/20  4:49 PM   Specimen: BLOOD  Result Value Ref Range Status   Specimen Description BLOOD LEFT ANTECUBITAL  Final   Special Requests   Final    BOTTLES DRAWN AEROBIC AND ANAEROBIC Blood Culture adequate volume   Culture   Final    NO GROWTH 5 DAYS Performed at [facility], [address], [place],  [address]    Report Status 11/27/2020 FINAL  Final    RADIOLOGY:  US Venous Img Lower Bilateral (DVT)  Result Date: 11/26/2020 CLINICAL DATA:  Shortness of breath, edema EXAM: BILATERAL LOWER EXTREMITY VENOUS DOPPLER ULTRASOUND TECHNIQUE: Gray-scale sonography with graded compression, as well as color Doppler and duplex ultrasound were performed to evaluate the lower extremity deep venous systems from the level of the common femoral vein and including the common femoral, femoral, profunda femoral, popliteal and calf  veins including the posterior tibial, peroneal and gastrocnemius veins when visible. The superficial great saphenous vein was also interrogated. Spectral Doppler was utilized to evaluate flow at rest and with distal augmentation maneuvers in the common femoral, femoral and popliteal veins. COMPARISON:  None. FINDINGS: RIGHT LOWER EXTREMITY Common Femoral Vein: No evidence of thrombus. Normal compressibility, respiratory phasicity and response to augmentation. Saphenofemoral Junction: No evidence of thrombus. Normal compressibility and flow on color Doppler imaging. Profunda Femoral Vein: No evidence of thrombus. Normal compressibility and flow on color Doppler imaging. Femoral Vein: No evidence of thrombus. Normal compressibility, respiratory phasicity and response to augmentation. Popliteal Vein: No evidence of thrombus. Normal compressibility, respiratory phasicity and response to augmentation. Calf Veins: Limited assessment because of peripheral edema. No gross thrombus evident. LEFT LOWER EXTREMITY Common Femoral Vein: No evidence of thrombus. Normal compressibility, respiratory phasicity and response to augmentation. Saphenofemoral Junction: No evidence of thrombus. Normal compressibility and flow on color Doppler imaging. Profunda Femoral Vein: No evidence of thrombus. Normal compressibility and flow on color Doppler imaging. Femoral Vein: No evidence of thrombus. Normal compressibility, respiratory phasicity and response to augmentation. Popliteal Vein: No evidence of thrombus. Normal compressibility, respiratory phasicity and response to augmentation. Calf Veins: Limited assessment because of peripheral edema. No gross thrombus evident. IMPRESSION: No significant femoropopliteal DVT in either extremity. Limited assessment of the calf veins because of peripheral edema. Electronically Signed   By: Jerilynn Mages.  Shick M.D.  On: 11/26/2020 15:19     CODE STATUS:     Code Status Orders  (From admission, onward)          Start     Ordered   11/22/20 2113  Full code  Continuous        11/22/20 2118        Code Status History    This patient has a current code status but no historical code status.   Advance Care Planning Activity       TOTAL TIME TAKING CARE OF THIS PATIENT: *35* minutes.    Fritzi Mandes M.D  [facility]    CC: Primary care physician; Gladstone Lighter, MD

## 2020-11-28 NOTE — Plan of Care (Signed)

## 2020-11-28 NOTE — Discharge Instructions (Signed)
Monitoring for fever at home. Keep log for sugars at home. Review with PCP Dr. Nemiah Commander

## 2020-11-30 ENCOUNTER — Inpatient Hospital Stay: Payer: Medicare HMO

## 2020-11-30 ENCOUNTER — Telehealth: Payer: Self-pay

## 2020-11-30 ENCOUNTER — Emergency Department: Payer: Medicare HMO

## 2020-11-30 ENCOUNTER — Inpatient Hospital Stay
Admission: EM | Admit: 2020-11-30 | Discharge: 2020-12-28 | DRG: 871 | Disposition: E | Payer: Medicare HMO | Attending: Internal Medicine | Admitting: Internal Medicine

## 2020-11-30 DIAGNOSIS — R609 Edema, unspecified: Secondary | ICD-10-CM | POA: Diagnosis present

## 2020-11-30 DIAGNOSIS — U071 COVID-19: Secondary | ICD-10-CM

## 2020-11-30 DIAGNOSIS — E875 Hyperkalemia: Secondary | ICD-10-CM | POA: Diagnosis present

## 2020-11-30 DIAGNOSIS — I5021 Acute systolic (congestive) heart failure: Secondary | ICD-10-CM | POA: Diagnosis not present

## 2020-11-30 DIAGNOSIS — J9601 Acute respiratory failure with hypoxia: Secondary | ICD-10-CM

## 2020-11-30 DIAGNOSIS — N179 Acute kidney failure, unspecified: Secondary | ICD-10-CM | POA: Diagnosis present

## 2020-11-30 DIAGNOSIS — R4020 Unspecified coma: Secondary | ICD-10-CM | POA: Diagnosis not present

## 2020-11-30 DIAGNOSIS — J80 Acute respiratory distress syndrome: Secondary | ICD-10-CM | POA: Diagnosis not present

## 2020-11-30 DIAGNOSIS — Z79899 Other long term (current) drug therapy: Secondary | ICD-10-CM | POA: Diagnosis not present

## 2020-11-30 DIAGNOSIS — Z515 Encounter for palliative care: Secondary | ICD-10-CM | POA: Diagnosis not present

## 2020-11-30 DIAGNOSIS — Z452 Encounter for adjustment and management of vascular access device: Secondary | ICD-10-CM

## 2020-11-30 DIAGNOSIS — Z4682 Encounter for fitting and adjustment of non-vascular catheter: Secondary | ICD-10-CM | POA: Diagnosis not present

## 2020-11-30 DIAGNOSIS — K219 Gastro-esophageal reflux disease without esophagitis: Secondary | ICD-10-CM | POA: Diagnosis present

## 2020-11-30 DIAGNOSIS — I4891 Unspecified atrial fibrillation: Secondary | ICD-10-CM | POA: Diagnosis present

## 2020-11-30 DIAGNOSIS — I471 Supraventricular tachycardia: Secondary | ICD-10-CM | POA: Diagnosis present

## 2020-11-30 DIAGNOSIS — A4189 Other specified sepsis: Secondary | ICD-10-CM | POA: Diagnosis not present

## 2020-11-30 DIAGNOSIS — R6521 Severe sepsis with septic shock: Secondary | ICD-10-CM | POA: Diagnosis not present

## 2020-11-30 DIAGNOSIS — E870 Hyperosmolality and hypernatremia: Secondary | ICD-10-CM | POA: Diagnosis present

## 2020-11-30 DIAGNOSIS — G928 Other toxic encephalopathy: Secondary | ICD-10-CM | POA: Diagnosis present

## 2020-11-30 DIAGNOSIS — J1282 Pneumonia due to coronavirus disease 2019: Secondary | ICD-10-CM | POA: Diagnosis present

## 2020-11-30 DIAGNOSIS — Z8249 Family history of ischemic heart disease and other diseases of the circulatory system: Secondary | ICD-10-CM

## 2020-11-30 DIAGNOSIS — E1165 Type 2 diabetes mellitus with hyperglycemia: Secondary | ICD-10-CM | POA: Diagnosis not present

## 2020-11-30 DIAGNOSIS — E860 Dehydration: Secondary | ICD-10-CM | POA: Diagnosis present

## 2020-11-30 DIAGNOSIS — R404 Transient alteration of awareness: Secondary | ICD-10-CM | POA: Diagnosis not present

## 2020-11-30 DIAGNOSIS — R069 Unspecified abnormalities of breathing: Secondary | ICD-10-CM | POA: Diagnosis not present

## 2020-11-30 DIAGNOSIS — I639 Cerebral infarction, unspecified: Secondary | ICD-10-CM | POA: Diagnosis present

## 2020-11-30 DIAGNOSIS — I110 Hypertensive heart disease with heart failure: Secondary | ICD-10-CM | POA: Diagnosis present

## 2020-11-30 DIAGNOSIS — Z7982 Long term (current) use of aspirin: Secondary | ICD-10-CM | POA: Diagnosis not present

## 2020-11-30 DIAGNOSIS — Z66 Do not resuscitate: Secondary | ICD-10-CM | POA: Diagnosis not present

## 2020-11-30 DIAGNOSIS — R4182 Altered mental status, unspecified: Secondary | ICD-10-CM | POA: Diagnosis not present

## 2020-11-30 DIAGNOSIS — R918 Other nonspecific abnormal finding of lung field: Secondary | ICD-10-CM | POA: Diagnosis not present

## 2020-11-30 LAB — COMPREHENSIVE METABOLIC PANEL
ALT: 24 U/L (ref 0–44)
AST: 68 U/L — ABNORMAL HIGH (ref 15–41)
Albumin: 2 g/dL — ABNORMAL LOW (ref 3.5–5.0)
Alkaline Phosphatase: 94 U/L (ref 38–126)
Anion gap: 19 — ABNORMAL HIGH (ref 5–15)
BUN: 72 mg/dL — ABNORMAL HIGH (ref 8–23)
CO2: 15 mmol/L — ABNORMAL LOW (ref 22–32)
Calcium: 7.4 mg/dL — ABNORMAL LOW (ref 8.9–10.3)
Chloride: 118 mmol/L — ABNORMAL HIGH (ref 98–111)
Creatinine, Ser: 2.08 mg/dL — ABNORMAL HIGH (ref 0.44–1.00)
GFR, Estimated: 23 mL/min — ABNORMAL LOW (ref >60)
Glucose, Bld: 235 mg/dL — ABNORMAL HIGH (ref 70–99)
Potassium: 5.5 mmol/L — ABNORMAL HIGH (ref 3.5–5.1)
Sodium: 152 mmol/L — ABNORMAL HIGH (ref 135–145)
Total Bilirubin: 1.4 mg/dL — ABNORMAL HIGH (ref 0.3–1.2)
Total Protein: 5.8 g/dL — ABNORMAL LOW (ref 6.5–8.1)

## 2020-11-30 LAB — BLOOD GAS, ARTERIAL
Acid-base deficit: 8.1 mmol/L — ABNORMAL HIGH (ref 0.0–2.0)
Bicarbonate: 18.4 mmol/L — ABNORMAL LOW (ref 20.0–28.0)
FIO2: 0.7
MECHVT: 400 mL
Mechanical Rate: 16
O2 Saturation: 90.8 %
PEEP: 5 cmH2O
Patient temperature: 37
RATE: 16 resp/min
pCO2 arterial: 41 mmHg (ref 32.0–48.0)
pH, Arterial: 7.26 — ABNORMAL LOW (ref 7.350–7.450)
pO2, Arterial: 70 mmHg — ABNORMAL LOW (ref 83.0–108.0)

## 2020-11-30 LAB — URINALYSIS, COMPLETE (UACMP) WITH MICROSCOPIC
Bacteria, UA: NONE SEEN
Bilirubin Urine: NEGATIVE
Glucose, UA: NEGATIVE mg/dL
Ketones, ur: NEGATIVE mg/dL
Leukocytes,Ua: NEGATIVE
Nitrite: NEGATIVE
Protein, ur: 30 mg/dL — AB
Specific Gravity, Urine: 1.013 (ref 1.005–1.030)
pH: 5 (ref 5.0–8.0)

## 2020-11-30 LAB — CBC
HCT: 17.8 % — ABNORMAL LOW (ref 36.0–46.0)
HCT: 26.2 % — ABNORMAL LOW (ref 36.0–46.0)
Hemoglobin: 5.6 g/dL — ABNORMAL LOW (ref 12.0–15.0)
Hemoglobin: 8.3 g/dL — ABNORMAL LOW (ref 12.0–15.0)
MCH: 35.2 pg — ABNORMAL HIGH (ref 26.0–34.0)
MCH: 30 pg (ref 26.0–34.0)
MCHC: 31.5 g/dL (ref 30.0–36.0)
MCHC: 31.7 g/dL (ref 30.0–36.0)
MCV: 111.9 fL — ABNORMAL HIGH (ref 80.0–100.0)
MCV: 94.6 fL (ref 80.0–100.0)
Platelets: 202 10*3/uL (ref 150–400)
Platelets: 180 10*3/uL (ref 150–400)
RBC: 1.59 MIL/uL — ABNORMAL LOW (ref 3.87–5.11)
RBC: 2.77 MIL/uL — ABNORMAL LOW (ref 3.87–5.11)
RDW: 18.9 % — ABNORMAL HIGH (ref 11.5–15.5)
RDW: 17.3 % — ABNORMAL HIGH (ref 11.5–15.5)
WBC: 0.9 10*3/uL — CL (ref 4.0–10.5)
WBC: 1.1 10*3/uL — CL (ref 4.0–10.5)
nRBC: 100 % — ABNORMAL HIGH (ref 0.0–0.2)
nRBC: 172.1 % — ABNORMAL HIGH (ref 0.0–0.2)

## 2020-11-30 LAB — TROPONIN I (HIGH SENSITIVITY)
Troponin I (High Sensitivity): 10155 ng/L (ref <18)
Troponin I (High Sensitivity): 16113 ng/L (ref <18)
Troponin I (High Sensitivity): 23419 ng/L (ref <18)
Troponin I (High Sensitivity): 26163 ng/L (ref <18)

## 2020-11-30 LAB — BASIC METABOLIC PANEL
Anion gap: 15 (ref 5–15)
BUN: 71 mg/dL — ABNORMAL HIGH (ref 8–23)
CO2: 19 mmol/L — ABNORMAL LOW (ref 22–32)
Calcium: 6.7 mg/dL — ABNORMAL LOW (ref 8.9–10.3)
Chloride: 117 mmol/L — ABNORMAL HIGH (ref 98–111)
Creatinine, Ser: 1.9 mg/dL — ABNORMAL HIGH (ref 0.44–1.00)
GFR, Estimated: 26 mL/min — ABNORMAL LOW (ref >60)
Glucose, Bld: 308 mg/dL — ABNORMAL HIGH (ref 70–99)
Potassium: 4.7 mmol/L (ref 3.5–5.1)
Sodium: 151 mmol/L — ABNORMAL HIGH (ref 135–145)

## 2020-11-30 LAB — LACTIC ACID, PLASMA
Lactic Acid, Venous: 10.1 mmol/L (ref 0.5–1.9)
Lactic Acid, Venous: 6.1 mmol/L (ref 0.5–1.9)
Lactic Acid, Venous: 4.1 mmol/L (ref 0.5–1.9)

## 2020-11-30 LAB — PROTIME-INR
INR: 2.4 — ABNORMAL HIGH (ref 0.8–1.2)
Prothrombin Time: 25.4 seconds — ABNORMAL HIGH (ref 11.4–15.2)

## 2020-11-30 LAB — PROCALCITONIN: Procalcitonin: 0.29 ng/mL

## 2020-11-30 LAB — PREPARE RBC (CROSSMATCH)

## 2020-11-30 LAB — APTT: aPTT: 31 seconds (ref 24–36)

## 2020-11-30 LAB — GLUCOSE, CAPILLARY
Glucose-Capillary: 100 mg/dL — ABNORMAL HIGH (ref 70–99)
Glucose-Capillary: 110 mg/dL — ABNORMAL HIGH (ref 70–99)

## 2020-11-30 LAB — CREATININE, SERUM
Creatinine, Ser: 1.95 mg/dL — ABNORMAL HIGH (ref 0.44–1.00)
GFR, Estimated: 25 mL/min — ABNORMAL LOW (ref >60)

## 2020-11-30 LAB — BRAIN NATRIURETIC PEPTIDE: B Natriuretic Peptide: 794.1 pg/mL — ABNORMAL HIGH (ref 0.0–100.0)

## 2020-11-30 LAB — MRSA PCR SCREENING: MRSA by PCR: NEGATIVE

## 2020-11-30 MED ORDER — SODIUM CHLORIDE 0.9 % IV SOLN
2.0000 g | Freq: Once | INTRAVENOUS | Status: AC
  Administered 2020-11-30: 2 g via INTRAVENOUS
  Filled 2020-11-30: qty 2

## 2020-11-30 MED ORDER — VASOPRESSIN 20 UNITS/100 ML INFUSION FOR SHOCK
0.0000 [IU]/min | INTRAVENOUS | Status: DC
  Administered 2020-11-30: 0.03 [IU]/min via INTRAVENOUS
  Administered 2020-12-01: 0.04 [IU]/min via INTRAVENOUS
  Filled 2020-11-30 (×3): qty 100

## 2020-11-30 MED ORDER — SODIUM CHLORIDE 0.9% FLUSH: 3.0000 mL | INTRAVENOUS | Status: DC | PRN

## 2020-11-30 MED ORDER — DOCUSATE SODIUM 100 MG PO CAPS: 100.0000 mg | ORAL_CAPSULE | Freq: Two times a day (BID) | ORAL | Status: DC | PRN

## 2020-11-30 MED ORDER — CHLORHEXIDINE GLUCONATE 0.12% ORAL RINSE (MEDLINE KIT)
15.0000 mL | Freq: Two times a day (BID) | OROMUCOSAL | Status: DC
  Administered 2020-11-30 – 2020-12-01 (×2): 15 mL via OROMUCOSAL
  Filled 2020-11-30 (×2): qty 15

## 2020-11-30 MED ORDER — IOHEXOL 350 MG/ML SOLN
60.0000 mL | Freq: Once | INTRAVENOUS | Status: AC | PRN
  Administered 2020-11-30: 50 mL via INTRAVENOUS

## 2020-11-30 MED ORDER — DEXMEDETOMIDINE HCL IN NACL 400 MCG/100ML IV SOLN
0.4000 ug/kg/h | INTRAVENOUS | Status: DC
  Administered 2020-11-30: 0.4 ug/kg/h via INTRAVENOUS

## 2020-11-30 MED ORDER — POLYETHYLENE GLYCOL 3350 17 G PO PACK: 17.0000 g | PACK | Freq: Every day | ORAL | Status: DC

## 2020-11-30 MED ORDER — SODIUM CHLORIDE 0.9 % IV SOLN: Freq: Once | INTRAVENOUS | Status: DC

## 2020-11-30 MED ORDER — PHENYLEPHRINE CONCENTRATED 100MG/250ML (0.4 MG/ML) INFUSION SIMPLE
0.0000 ug/min | INTRAVENOUS | Status: DC
  Filled 2020-11-30: qty 250

## 2020-11-30 MED ORDER — SODIUM CHLORIDE 0.9 % IV SOLN
500.0000 mg | Freq: Once | INTRAVENOUS | Status: AC
  Administered 2020-11-30: 500 mg via INTRAVENOUS
  Filled 2020-11-30: qty 500

## 2020-11-30 MED ORDER — PANTOPRAZOLE SODIUM 40 MG IV SOLR
40.0000 mg | Freq: Every day | INTRAVENOUS | Status: DC
  Administered 2020-11-30: 40 mg via INTRAVENOUS
  Filled 2020-11-30: qty 40

## 2020-11-30 MED ORDER — POLYETHYLENE GLYCOL 3350 17 G PO PACK
17.0000 g | PACK | Freq: Every day | ORAL | Status: DC | PRN
Start: 2020-11-30 — End: 2020-12-01

## 2020-11-30 MED ORDER — INSULIN ASPART 100 UNIT/ML ~~LOC~~ SOLN: 0.0000 [IU] | SUBCUTANEOUS | Status: DC

## 2020-11-30 MED ORDER — SODIUM CHLORIDE 0.9 % IV SOLN: 250.0000 mL | INTRAVENOUS | Status: DC | PRN

## 2020-11-30 MED ORDER — FAMOTIDINE IN NACL 20-0.9 MG/50ML-% IV SOLN: 20.0000 mg | INTRAVENOUS | Status: DC

## 2020-11-30 MED ORDER — HEPARIN SODIUM (PORCINE) 5000 UNIT/ML IJ SOLN: 5000.0000 [IU] | Freq: Three times a day (TID) | INTRAMUSCULAR | Status: DC

## 2020-11-30 MED ORDER — SODIUM CHLORIDE 0.9 % IV SOLN
1.0000 g | INTRAVENOUS | Status: DC
  Filled 2020-11-30: qty 10

## 2020-11-30 MED ORDER — SODIUM CHLORIDE 0.9 % IV SOLN
500.0000 mg | INTRAVENOUS | Status: DC
  Filled 2020-11-30: qty 500

## 2020-11-30 MED ORDER — DOCUSATE SODIUM 50 MG/5ML PO LIQD
100.0000 mg | Freq: Two times a day (BID) | ORAL | Status: DC
  Filled 2020-11-30: qty 10

## 2020-11-30 MED ORDER — SODIUM CHLORIDE 0.9 % IV SOLN
0.0000 ug/min | INTRAVENOUS | Status: DC
  Administered 2020-11-30: 20 ug/min via INTRAVENOUS
  Administered 2020-12-01 (×2): 400 ug/min via INTRAVENOUS
  Filled 2020-11-30 (×3): qty 10

## 2020-11-30 MED ORDER — NOREPINEPHRINE 4 MG/250ML-% IV SOLN
0.0000 ug/min | INTRAVENOUS | Status: DC
  Administered 2020-11-30: 5 ug/min via INTRAVENOUS
  Filled 2020-11-30: qty 250

## 2020-11-30 MED ORDER — LACTATED RINGERS IV BOLUS
1000.0000 mL | Freq: Once | INTRAVENOUS | Status: AC
  Administered 2020-11-30: 1000 mL via INTRAVENOUS

## 2020-11-30 MED ORDER — ONDANSETRON HCL 4 MG/2ML IJ SOLN: 4.0000 mg | Freq: Four times a day (QID) | INTRAMUSCULAR | Status: DC | PRN

## 2020-11-30 MED ORDER — CHLORHEXIDINE GLUCONATE CLOTH 2 % EX PADS
6.0000 | MEDICATED_PAD | Freq: Every day | CUTANEOUS | Status: DC
  Administered 2020-11-30: 6 via TOPICAL
  Filled 2020-11-30: qty 6

## 2020-11-30 MED ORDER — SODIUM BICARBONATE 8.4 % IV SOLN
100.0000 meq | Freq: Once | INTRAVENOUS | Status: AC
  Administered 2020-11-30: 100 meq via INTRAVENOUS
  Filled 2020-11-30: qty 100

## 2020-11-30 MED ORDER — NOREPINEPHRINE 16 MG/250ML-% IV SOLN
0.0000 ug/min | INTRAVENOUS | Status: DC
  Administered 2020-11-30 – 2020-12-01 (×3): 40 ug/min via INTRAVENOUS
  Filled 2020-11-30 (×4): qty 250

## 2020-11-30 MED ORDER — ORAL CARE MOUTH RINSE
15.0000 mL | OROMUCOSAL | Status: DC
  Administered 2020-11-30 – 2020-12-01 (×6): 15 mL via OROMUCOSAL
  Filled 2020-11-30 (×5): qty 15

## 2020-11-30 MED ORDER — SODIUM CHLORIDE 0.9% FLUSH
3.0000 mL | Freq: Two times a day (BID) | INTRAVENOUS | Status: DC
  Administered 2020-11-30 – 2020-12-01 (×2): 3 mL via INTRAVENOUS

## 2020-11-30 MED ORDER — ACETAMINOPHEN 325 MG PO TABS: 650.0000 mg | ORAL_TABLET | ORAL | Status: DC | PRN

## 2020-11-30 NOTE — Telephone Encounter (Signed)
Done

## 2020-11-30 NOTE — Progress Notes (Signed)
Updated pts son Roxene Alviar regarding pt condition and current plan of care.  Informed Mr. Pinkstaff pt is in multiorgan failure and CT Head revealed acute CVA.  Also, discussed the fact pt is currently unresponsive, and requiring 2 vasopressors in an attempt to keep blood pressure within normal limits.  Discussed code status with Mr. Haddaway and at this time he would like the pt to remain a FULL CODE.  Obtained consent for central line placement.  All questions were answered, will continue to monitor and assess pt.  Sonda Rumble, AGNP  Pulmonary/Critical Care Pager [phone] (please enter 7 digits) PCCM Consult Pager [phone] (please enter 7 digits)

## 2020-11-30 NOTE — Procedures (Signed)
Central Venous Catheter Insertion Procedure Note  Martha Butler  [id number]  October 05, 1935  Date:12/23/2020  Time:9:15 PM   Provider Performing:[facility]   Procedure: Insertion of Non-tunneled Central Venous [phone]) with US guidance ([id number])   Indication(s) Medication administration and Difficult access  Consent Risks of the procedure as well as the alternatives and risks of each were explained to the patient and/or caregiver.  Consent for the procedure was obtained and is signed in the bedside chart  Anesthesia Topical only with 1% lidocaine   Timeout Verified patient identification, verified procedure, site/side was marked, verified correct patient position, special equipment/implants available, medications/allergies/relevant history reviewed, required imaging and test results available.  Sterile Technique Maximal sterile technique including full sterile barrier drape, hand hygiene, sterile gown, sterile gloves, mask, hair covering, sterile ultrasound probe cover (if used).  Procedure Description Area of catheter insertion was cleaned with chlorhexidine and draped in sterile fashion.  With real-time ultrasound guidance a central venous catheter was placed into the left internal jugular vein. Nonpulsatile blood flow and easy flushing noted in all ports.  The catheter was sutured in place and sterile dressing applied.  Complications/Tolerance None; patient tolerated the procedure well. Chest X-ray is ordered to verify placement for internal jugular or subclavian cannulation.   Chest x-ray is not ordered for femoral cannulation.  EBL Minimal  Specimen(s) None  Martha Butler, AGNP  Pulmonary/Critical Care Pager [phone] (please enter 7 digits) PCCM Consult Pager [phone] (please enter 7 digits)

## 2020-11-30 NOTE — Telephone Encounter (Signed)
-----   Message from Rickard Patience, MD sent at 11/29/2020  8:40 PM EST ----- Next week please. Thanks.  ----- Message ----- From: Coralee Rud, RN Sent: 11/29/2020   9:10 AM EST To: Rennie Natter, NT, Coralee Rud, RN, #  Dr. Cathie Hoops, you replied to a phone note on 1/27 wanting appts to be moved out 2 weeks due to patient being admitted for COVID. lab/MD/blood trans appts were moved to 2/16 per your request. Did you want to keep it on 2/16 or move up to next week? Just want to clarify   ----- Message ----- From: Guerry Minors, CMA Sent: 11/29/2020   9:01 AM EST To: Rennie Natter, NT, Coralee Rud, RN, #  Please r/s ----- Message ----- From: Rickard Patience, MD Sent: 11/29/2020   8:44 AM EST To: Coralee Rud, RN, Guerry Minors, CMA  She is recently admitted. Please move her appt with me up to next week. Thanks.

## 2020-11-30 NOTE — ED Notes (Signed)
Dana at bedside inserting central line at this time

## 2020-11-30 NOTE — Telephone Encounter (Signed)
Please move appts up to next week as requested by MD and notify pt's son.

## 2020-11-30 NOTE — Progress Notes (Signed)
Pt was transported to CT from CCU while on the vent.

## 2020-11-30 NOTE — H&P (Signed)
Name: Martha Butler MRN: [id number] DOB: 07/17/1935     CONSULTATION DATE: 12/08/2020  REFERRING MD : Charna Archer  CHIEF COMPLAINT:  COVID pneumonia  STUDIES:  The CXR was Independently Reviewed By Me Today CXR reviewed-progressive b/l infiltrates  HISTORY OF PRESENT ILLNESS:   86 yo female with previous dx of COVID 19 pneumonia and recent admission with underlying diagnosis of  SVT (supraventricular tachycardia) ([place]) [I47.1] New onset a-fib ([place]) [I48.91] AKI (acute kidney injury) ([place]) [N17.9] Pneumonia due to COVID-19 virus [U07.1, J12.82] Acute hypoxic respiratory failure secondary to COVID-19 pneumonia  Was discharged 2 days ago Family states that pt has been feeling unwell over the past several days then became unresponsive today.  becoming unresponsive at home with family patient was having increasing trouble breathing throughout the day today and eventually lost consciousness.  ER COURSE Patient was emergently intubated  PAST MEDICAL HISTORY :   has a past medical history of Depression, GERD (gastroesophageal reflux disease), Hypertension, Leukopenia, and Macrocytic anemia.  has no past surgical history on file. Prior to Admission medications   Medication Sig Start Date End Date Taking? Authorizing Provider  amiodarone (PACERONE) 200 MG tablet Take 1 tablet (200 mg total) by mouth 2 (two) times daily. 12/03/20   Fritzi Mandes, MD  amiodarone (PACERONE) 200 MG tablet Take 1 tablet (200 mg total) by mouth daily. 12/10/20   Fritzi Mandes, MD  amiodarone (PACERONE) 400 MG tablet Take 1 tablet (400 mg total) by mouth 2 (two) times daily. 11/28/20   Fritzi Mandes, MD  aspirin EC 81 MG tablet Take 1 tablet by mouth daily.    [provider]  blood glucose meter kit and supplies KIT Dispense based on patient and insurance preference. Use up to four times daily as directed. (FOR ICD-9 250.00, 250.01). 11/28/20   Fritzi Mandes, MD  docusate sodium (COLACE) 100 MG capsule Take 1  capsule (100 mg total) by mouth daily. Do not take if you have loose stools 11/10/20   Earlie Server, MD  escitalopram (LEXAPRO) 5 MG tablet Take 5 mg by mouth daily. 06/26/18   [provider]  glipiZIDE (GLUCOTROL) 5 MG tablet Take 1 tablet (5 mg total) by mouth daily before breakfast. 11/29/20   Fritzi Mandes, MD  Multiple Vitamin (MULTIVITAMIN WITH MINERALS) TABS tablet Take 1 tablet by mouth daily. 11/29/20   Fritzi Mandes, MD  predniSONE (DELTASONE) 10 MG tablet Take 40 mg daily --taper by 10 mg daily then stop 11/29/20   Fritzi Mandes, MD   No Known Allergies  FAMILY HISTORY:  HTN SOCIAL HISTORY:  reports that she has never smoked. She has never used smokeless tobacco. She reports that she does not drink alcohol and does not use drugs.  REVIEW OF SYSTEMS:   Unable to obtain due to critical illness      Estimated body mass index is 20.25 kg/m as calculated from the following:   Height as of 11/24/20: 5' 7"  (1.702 m).   Weight as of 11/28/20: 58.7 kg.    VITAL SIGNS: Pulse Rate:  [70-101] 70 (02/01 1650) Resp:  [14-23] 20 (02/01 1650) BP: (36-130)/(17-90) 84/33 (02/01 1650) SpO2:  [99 %-100 %] 100 % (02/01 1650) FiO2 (%):  [100 %] 100 % (02/01 1505)   No intake/output data recorded. No intake/output data recorded.   SpO2: 100 % O2 Flow Rate (L/min): 15 L/min FiO2 (%): 100 %   Physical Examination:  GENERAL:acutely ill appearing female, NAD mechanically intubated  HEAD: Normocephalic, atraumatic.  MOUTH:  Moist mucosal membrane. NECK: Supple. No JVD.  PULMONARY: diminished throughout, even, non labored  CARDIOVASCULAR: S1 and S2. Regular rate and rhythm. No rubs or gallops.  GASTROINTESTINAL: Soft, nontender, -distended.  Positive bowel sounds.  MUSCULOSKELETAL: 2+ generalized edema  NEUROLOGIC: obtunded, not withdrawing from painful stimulation or following commands, bilateral pupils pinpoint and sluggish SKIN:intact,warm,dry  MEDICATIONS: I have reviewed all  medications and confirmed regimen as documented   CULTURE RESULTS   Recent Results (from the past 240 hour(s))  Urine culture     Status: Abnormal   Collection Time: 11/22/20  3:05 PM   Specimen: Urine, Random  Result Value Ref Range Status   Specimen Description   Final    URINE, RANDOM Performed at [facility], [address] [place], [place] [address]    Special Requests   Final    NONE Performed at [facility], [address], [place], [place] [address]    Culture (A)  Final    <10,000 COLONIES/mL INSIGNIFICANT GROWTH Performed at [facility], [place] [address] [place], [place] [address]    Report Status 11/25/2020 FINAL  Final  Blood Culture (routine x 2)     Status: None   Collection Time: 11/22/20  4:49 PM   Specimen: BLOOD  Result Value Ref Range Status   Specimen Description BLOOD BLOOD LEFT ARM  Final   Special Requests   Final    BOTTLES DRAWN AEROBIC AND ANAEROBIC Blood Culture adequate volume   Culture   Final    NO GROWTH 5 DAYS Performed at [facility], [address] [place], [place] [address]    Report Status 11/27/2020 FINAL  Final  Blood Culture (routine x 2)     Status: None   Collection Time: 11/22/20  4:49 PM   Specimen: BLOOD  Result Value Ref Range Status   Specimen Description BLOOD LEFT ANTECUBITAL  Final   Special Requests   Final    BOTTLES DRAWN AEROBIC AND ANAEROBIC Blood Culture adequate volume   Culture   Final    NO GROWTH 5 DAYS Performed at [facility], [address] [place], [place] [address]    Report Status 11/27/2020 FINAL  Final        CBC    Component Value Date/Time   WBC 0.9 (LL) 12/09/2020 1519   RBC 1.59 (L) 12/10/2020 1519   HGB 5.6 (L) 12/10/2020 1519   HCT 17.8 (L) 12/15/2020 1519   PLT 202 12/04/2020 1519   MCV 111.9 (H) 12/27/2020 1519   MCH 35.2 (H) 12/05/2020 1519   MCHC 31.5 12/11/2020 1519   RDW 18.9 (H) 12/15/2020 1519   LYMPHSABS 0.3 (L) 11/24/2020  0356   MONOABS 0.0 (L) 11/24/2020 0356   EOSABS 0.0 11/24/2020 0356   BASOSABS 0.0 11/24/2020 0356   BMP Latest Ref Rng & Units 12/21/2020 11/28/2020 11/27/2020  Glucose 70 - 99 mg/dL 235(H) - 121(H)  BUN 8 - 23 mg/dL 72(H) - 51(H)  Creatinine 0.44 - 1.00 mg/dL 2.08(H) - 1.29(H)  Sodium 135 - 145 mmol/L 152(H) 150(H) 152(H)  Potassium 3.5 - 5.1 mmol/L 5.5(H) - 3.4(L)  Chloride 98 - 111 mmol/L 118(H) - 118(H)  CO2 22 - 32 mmol/L 15(L) - 23  Calcium 8.9 - 10.3 mg/dL 7.4(L) - 7.5(L)     IMAGING    DG Chest Portable 1 View  Result Date: 12/16/2020 CLINICAL DATA:  Post intubation EXAM: PORTABLE CHEST 1 VIEW COMPARISON:  11/22/2020 FINDINGS: The endotracheal tube  terminates above the carina. The enteric tube is malpositioned with the tip terminating near the GE junction and the side hole within the esophagus. There are diffuse bilateral hazy airspace opacities which have worsened since the prior study. There is no pneumothorax. No definite large pleural effusion. No acute osseous abnormality. The heart size is stable. IMPRESSION: 1. Endotracheal tube terminates above the carina. 2. Enteric tube should be further advanced into the stomach. 3. Worsening diffuse bilateral hazy airspace opacities. 4. No pneumothorax. These results will be called to the ordering clinician or representative by the Radiologist Assistant, and communication documented in the PACS or [employer]. Electronically Signed   By: Constance Holster M.D.   On: 12/18/2020 15:51       Indwelling Urinary Catheter continued, requirement due to   Reason to continue Indwelling Urinary Catheter strict Intake/Output monitoring for hemodynamic instability         Ventilator continued, requirement due to severe respiratory failure   Ventilator Sedation RASS 0 to -2      ASSESSMENT AND PLAN SYNOPSIS 86 yo female with severe Hypoxic respiratory failure from COVID 19 pneumonia with severe ARDS with acute Ischemic  cardiomyopathy With acute renal failure   Acute hypoxemic respiratory failure due to COVID-19 pneumonia/ARDS Mechanical ventilation via ARDS protocol, target PRVC 6 cc/kg Wean PEEP and FiO2 as able Goal plateau pressure less than 30, driving pressure less than 15 Paralytics if necessary for vent synchrony, gas exchange Cycle prone positioning if necessary for oxygenation Diuresis as tolerated based on Kidney function VAP prevention order set Follow inflammatory markers as needed with CRP Vitamin C, zinc as indicated Plan to repeat and check resp cultures if needed  ACUTE SYSTOLIC CARDIAC FAILURE- ACUTE NSTEMI Septic shock secondary to suspected bacterial pneumonia  -Unable to anticoagulate due to possible GIB -Continuous telemetry monitoring  -Trend troponin's  -Levophed and vasopressin gtts to maintain map >65  ACUTE KIDNEY INJURY with hyperkalemia Hypernatremia likely secondary to dehydration  Lactic acidosis  -Trend BMP and lactic acid  -Replace electrolytes as indicated  -Monitor UOP -Avoid nephrotoxic medications  -Continue iv hydration   Sepsis secondary to suspected pneumonia  -Trend WBC and monitor fever curve -Trend PCT  -Follow cultures -Continue empiric azithromycin and ceftriaxone   Anemia suspected secondary to possible GI bleed (pt does have a hx of anemia of chronic disease suspected secondary to MDS per hematology progress note on 11/10/2020 pt refusing bone marrow biopsy)  -Serial H&H -SCD's for VTE px; avoid chemical prophylaxis  -Transfuse for hgb <7   GERD  -Continue IV protonix   Acute encephalopathy secondary to acute CVA-CT Head 02/1 revealed acute appearing infarct within the left cerebellar hemisphere and suspected infarct within the mid right frontal lobe/frontal operculum  -Maintain RASS goal of 0 to -1 -Prn Precedex gtt to maintain RASS goal  -Will consult neurology appreciate input  -Once stabilized will order MRI Brain to further assess  acute CVA  DVT/GI PRX ordered and assessed TRANSFUSIONS AS NEEDED MONITOR FSBS I Assessed the need for Labs I Assessed the need for Foley I Assessed the need for Central Venous Line Family Discussion when available I Assessed the need for Mobilization I made an Assessment of medications to be adjusted accordingly Safety Risk assessment Completed   Critical Care Time devoted to patient care services described in this note is 75 minutes.   Overall, patient is critically ill, prognosis is guarded.  Patient with Multiorgan failure and at high risk for cardiac arrest and death.  Corrin Parker, M.D.  [facility]  Medical Director [place] Director [place] Department    Marda Stalker, [place] Pager [phone] (please enter 7 digits) PCCM Consult Pager [phone] (please enter 7 digits)

## 2020-11-30 NOTE — ED Triage Notes (Signed)
Pt presents to the [facility] via EMS from home after becoming unresponsive at home with family. EMS states that pt was recently discharged from here after being admitted with Covid pneumonia. Family states that pt has been feeling unwell over the past several days then became unresponsive today.

## 2020-11-30 NOTE — Progress Notes (Signed)
eLink Physician-Brief Progress Note Patient Name: KRIMSON MASSMANN DOB: 1935-04-19 MRN: [id number]   Date of Service  Dec 12, 2020  HPI/Events of Note  NEW admit brief evaluation: Notes, labs, meds reviewed  Camera eval done: MAP 57, 95% sats, HR82. In synchrony with Vent.  Levo max 40, on vaso.  Trop 01751 3 rd one. Increasing but not doubling . 7.26/41/70/18. Cr 1.9- improving, K 4.7 LA down to 6.1 from > 10.pro cal 0.29 BNP 794. Hg up to 8.3 from 5.6, plt 180 K. INR 2.4 D dimer 7156 on 1/29. CTPA: No PE. Enlarged size of the main pulmonary artery, which can be seen in patients with elevated pulmonary artery pressures  CT head: IMPRESSION: 1.7 x 1.1 cm acute appearing infarct within the left cerebellar hemisphere. Suspected 2.5 cm acute cortical infarct within the mid right frontal lobe/frontal operculum. Consider brain MRI for confirmation and for further evaluation. UA neg ECHO from 25 th: EF > 55. PASP 45  A/P;   86 yo female with previous dx of COVID 19 pneumonia /ARDS, Isch Cardiomyopathy, Anemia, AKI, Sepsis from secondary PNA, Encephalopathy from above and New CVA.  Full code. Family is aware of diagnosis.    eICU Interventions  - continue current care .Consult Neurology - lung protective ventilation - keep MAP > 65. S/p 2 lit EGDT fluids. On abx.  - Bg goals < 180 - Vent bundle - follow Hg. On Protonix - Venous duplex to r/o DVT is pending. - no AC at this time/ suspected GI bleed. GI consultation. Hg improving . As per hemat: has MDS.Marland Kitchen transfuse if < 7     Intervention Category Major Interventions: Respiratory failure - evaluation and management;Other: Evaluation Type: New Patient Evaluation  Ranee Gosselin 2020/12/12, 9:36 PM

## 2020-11-30 NOTE — ED Provider Notes (Signed)
[facility] Emergency Department Provider Note   ____________________________________________   Event Date/Time   First MD Initiated Contact with Patient 12/21/2020 1531     (approximate)  I have reviewed the triage vital signs and the nursing notes.   HISTORY  Chief Complaint Unresponsive    HPI Martha Butler is a 86 y.o. female with past medical history of hypertension, SVT, anemia, neutropenia who presents to the ED unresponsive.  Per EMS, family called out stating that patient was having increasing trouble breathing throughout the day today and eventually lost consciousness.  She is awake and alert at baseline, was recently admitted to the hospital and diagnosed with COVID-19 on January 24.  She was doing well at the time of discharge and no longer requiring supplemental oxygen.  At the time of EMS arrival, patient was unresponsive with poor respiratory effort.  She was noted to be hypotensive but with difficult IV access, IO was placed and fluid bolus started.        Past Medical History:  Diagnosis Date  . Depression   . GERD (gastroesophageal reflux disease)   . Hypertension   . Leukopenia   . Macrocytic anemia     Patient Active Problem List   Diagnosis Date Noted  . ARDS (adult respiratory distress syndrome) ([place]) 12/22/2020  . Protein-calorie malnutrition, severe 11/25/2020  . Acute respiratory failure with hypoxia ([place])   . Pneumonia due to COVID-19 virus   . AKI (acute kidney injury) ([place])   . Hypernatremia   . Type 2 diabetes mellitus with hyperglycemia, without long-term current use of insulin ([place])   . SVT (supraventricular tachycardia) ([place]) 11/22/2020  . Goals of care, counseling/discussion 09/14/2020  . Neutropenia ([place]) 09/14/2020  . Unintentional weight loss 09/14/2020  . Anemia of chronic disease 09/14/2020  . Anemia, unspecified 10/26/2016  . Depression 06/29/2016  . Benign essential hypertension 06/29/2016  .  Caregiver stress 06/29/2016    No past surgical history on file.  Prior to Admission medications   Medication Sig Start Date End Date Taking? Authorizing Provider  amiodarone (PACERONE) 200 MG tablet Take 1 tablet (200 mg total) by mouth 2 (two) times daily. 12/03/20   Fritzi Mandes, MD  amiodarone (PACERONE) 200 MG tablet Take 1 tablet (200 mg total) by mouth daily. 12/10/20   Fritzi Mandes, MD  amiodarone (PACERONE) 400 MG tablet Take 1 tablet (400 mg total) by mouth 2 (two) times daily. 11/28/20   Fritzi Mandes, MD  aspirin EC 81 MG tablet Take 1 tablet by mouth daily.    [provider]  blood glucose meter kit and supplies KIT Dispense based on patient and insurance preference. Use up to four times daily as directed. (FOR ICD-9 250.00, 250.01). 11/28/20   Fritzi Mandes, MD  docusate sodium (COLACE) 100 MG capsule Take 1 capsule (100 mg total) by mouth daily. Do not take if you have loose stools 11/10/20   Earlie Server, MD  escitalopram (LEXAPRO) 5 MG tablet Take 5 mg by mouth daily. 06/26/18   [provider]  glipiZIDE (GLUCOTROL) 5 MG tablet Take 1 tablet (5 mg total) by mouth daily before breakfast. 11/29/20   Fritzi Mandes, MD  Multiple Vitamin (MULTIVITAMIN WITH MINERALS) TABS tablet Take 1 tablet by mouth daily. 11/29/20   Fritzi Mandes, MD  predniSONE (DELTASONE) 10 MG tablet Take 40 mg daily --taper by 10 mg daily then stop 11/29/20   Fritzi Mandes, MD    Allergies Patient has no known allergies.  No family history on file.  Social History Social History   Tobacco Use  . Smoking status: Never Smoker  . Smokeless tobacco: Never Used  Substance Use Topics  . Alcohol use: No  . Drug use: No    Review of Systems Unable to obtain secondary to patient unresponsive  ____________________________________________   PHYSICAL EXAM:  VITAL SIGNS: ED Triage Vitals  Enc Vitals Group     BP 12/03/2020 1505 130/90     Pulse Rate 12/07/2020 1505 96     Resp 12/03/2020 1505 14     Temp --       Temp src --      SpO2 12/07/2020 1505 100 %     Weight --      Height --      Head Circumference --      Peak Flow --      Pain Score 12/22/2020 1517 0     Pain Loc --      Pain Edu? --      Excl. in [place]? --     Constitutional: Unresponsive. Eyes: Conjunctivae are normal.  Pupils equal bilaterally and sluggish response to light. Head: Atraumatic. Nose: No congestion/rhinnorhea. Mouth/Throat: Mucous membranes are moist. Neck: Normal ROM Cardiovascular: Normal rate, regular rhythm. Grossly normal heart sounds. Respiratory: Poor respiratory effort with gasping respirations.  No retractions. Lungs CTAB. Gastrointestinal: Soft and nondistended Genitourinary: deferred Musculoskeletal: No lower extremity tenderness nor edema. Neurologic: Groaning response to sternal rub, no localization or withdrawal from pain in all 4 extremities. Skin:  Skin is warm, dry and intact. No rash noted. Psychiatric: Unable to assess.  ____________________________________________   LABS (all labs ordered are listed, but only abnormal results are displayed)  Labs Reviewed  COMPREHENSIVE METABOLIC PANEL - Abnormal; Notable for the following components:      Result Value   Sodium 152 (*)    Potassium 5.5 (*)    Chloride 118 (*)    CO2 15 (*)    Glucose, Bld 235 (*)    BUN 72 (*)    Creatinine, Ser 2.08 (*)    Calcium 7.4 (*)    Total Protein 5.8 (*)    Albumin 2.0 (*)    AST 68 (*)    Total Bilirubin 1.4 (*)    GFR, Estimated 23 (*)    Anion gap 19 (*)    All other components within normal limits  CBC - Abnormal; Notable for the following components:   WBC 0.9 (*)    RBC 1.59 (*)    Hemoglobin 5.6 (*)    HCT 17.8 (*)    MCV 111.9 (*)    MCH 35.2 (*)    RDW 18.9 (*)    nRBC 100.0 (*)    All other components within normal limits  BRAIN NATRIURETIC PEPTIDE - Abnormal; Notable for the following components:   B Natriuretic Peptide 794.1 (*)    All other components within normal limits   URINALYSIS, COMPLETE (UACMP) WITH MICROSCOPIC - Abnormal; Notable for the following components:   Color, Urine YELLOW (*)    APPearance CLOUDY (*)    Hgb urine dipstick MODERATE (*)    Protein, ur 30 (*)    All other components within normal limits  LACTIC ACID, PLASMA - Abnormal; Notable for the following components:   Lactic Acid, Venous 10.1 (*)    All other components within normal limits  TROPONIN I (HIGH SENSITIVITY) - Abnormal; Notable for the following components:   Troponin I (High  Sensitivity) 10,155 (*)    All other components within normal limits  CULTURE, BLOOD (ROUTINE X 2)  CULTURE, BLOOD (ROUTINE X 2)  PROCALCITONIN  BLOOD GAS, ARTERIAL  LACTIC ACID, PLASMA  PROTIME-INR  APTT  PROCALCITONIN  CBC  BASIC METABOLIC PANEL  BLOOD GAS, ARTERIAL  CBC  CREATININE, SERUM  CBG MONITORING, ED  PREPARE RBC (CROSSMATCH)  TYPE AND SCREEN  TROPONIN I (HIGH SENSITIVITY)   ____________________________________________  EKG  ED ECG REPORT I, Blake Divine, the attending physician, personally viewed and interpreted this ECG.   Date: 12/06/2020  EKG Time: 15:44  Rate: 92  Rhythm: normal sinus rhythm  Axis: Normal  Intervals:none  ST&T Change: None   PROCEDURES  Procedure(s) performed (including Critical Care):  .Critical Care Performed by: Blake Divine, MD Authorized by: Blake Divine, MD   Critical care provider statement:    Critical care time (minutes):  65   Critical care time was exclusive of:  Separately billable procedures and treating other patients and teaching time   Critical care was necessary to treat or prevent imminent or life-threatening deterioration of the following conditions:  Respiratory failure   Critical care was time spent personally by me on the following activities:  Discussions with consultants, evaluation of patient's response to treatment, examination of patient, ordering and performing treatments and interventions, ordering and  review of laboratory studies, ordering and review of radiographic studies, pulse oximetry, re-evaluation of patient's condition, obtaining history from patient or surrogate and review of old charts   I assumed direction of critical care for this patient from another provider in my specialty: no   Procedure Name: Intubation Date/Time: 12/23/2020 3:51 PM Performed by: Blake Divine, MD Pre-anesthesia Checklist: Patient identified, Patient being monitored, Emergency Drugs available, Timeout performed and Suction available Oxygen Delivery Method: Non-rebreather mask Preoxygenation: Pre-oxygenation with 100% oxygen Induction Type: Rapid sequence Ventilation: Mask ventilation without difficulty Laryngoscope Size: 4 and Glidescope Grade View: Grade I Tube size: 7.5 mm Number of attempts: 1 Airway Equipment and Method: Video-laryngoscopy Placement Confirmation: ETT inserted through vocal cords under direct vision,  CO2 detector and Breath sounds checked- equal and bilateral Secured at: 23 cm Tube secured with: ETT holder Dental Injury: Injury to lip         ____________________________________________   INITIAL IMPRESSION / ASSESSMENT AND PLAN / ED COURSE       86 year old female with past medical history of hypertension, GERD, SVT on amiodarone, anemia, and neutropenia who presents to the ED for difficulty breathing and unresponsiveness.  Patient remains unresponsive on arrival with very poor respiratory effort, difficult to obtain accurate O2 sat with her cool extremities.  She had IO in place by EMS but no direct vascular access, peripheral IVs were subsequently placed and patient was intubated.  EKG shows no ischemic changes but patient with troponin of greater than 10,000 and lactate of greater than 10.  We will hydrate with her with IV fluids, chest x-ray reviewed by me and shows worsening bilateral opacities potentially due to worsening pneumonia, but we will cover for bacterial  pneumonia with cefepime and azithromycin.  Patient now requiring Levophed for blood pressure support.  CTA chest performed and negative for PE but again demonstrates worsening bilateral opacities.  CT head shows likely acute stroke but patient is not a candidate for intervention at this time.  Hemoglobin shows drop of approximately 2 points from previous and is now at 5.3, given her ongoing shock, we will transfuse with emergent blood.  No obvious source  of acute bleeding at this time, family at bedside does not report recent bleeding.  I explained very poor prognosis to patient's son, who expresses understanding but wishes to continue aggressive treatment.  He would like patient to remain full code.  Case discussed with Dr. Mortimer Fries in the ICU for admission.      ____________________________________________   FINAL CLINICAL IMPRESSION(S) / ED DIAGNOSES  Final diagnoses:  Acute respiratory failure with hypoxia ([place])  Pneumonia due to COVID-19 virus     ED Discharge Orders    None       Note:  This document was prepared using Dragon voice recognition software and may include unintentional dictation errors.   Blake Divine, MD 12/02/2020 [phone]

## 2020-12-01 DIAGNOSIS — U071 COVID-19: Secondary | ICD-10-CM | POA: Diagnosis not present

## 2020-12-01 DIAGNOSIS — J80 Acute respiratory distress syndrome: Secondary | ICD-10-CM | POA: Diagnosis not present

## 2020-12-01 LAB — BASIC METABOLIC PANEL
Anion gap: 21 — ABNORMAL HIGH (ref 5–15)
BUN: 76 mg/dL — ABNORMAL HIGH (ref 8–23)
CO2: 25 mmol/L (ref 22–32)
Calcium: 6 mg/dL — CL (ref 8.9–10.3)
Chloride: 112 mmol/L — ABNORMAL HIGH (ref 98–111)
Creatinine, Ser: 2.64 mg/dL — ABNORMAL HIGH (ref 0.44–1.00)
GFR, Estimated: 17 mL/min — ABNORMAL LOW (ref >60)
Glucose, Bld: 669 mg/dL (ref 70–99)
Potassium: 4.8 mmol/L (ref 3.5–5.1)
Sodium: 158 mmol/L — ABNORMAL HIGH (ref 135–145)

## 2020-12-01 LAB — TYPE AND SCREEN
ABO/RH(D): O POS
Antibody Screen: NEGATIVE
Unit division: 0
Unit division: 0

## 2020-12-01 LAB — CBC
HCT: 26.3 % — ABNORMAL LOW (ref 36.0–46.0)
Hemoglobin: 8 g/dL — ABNORMAL LOW (ref 12.0–15.0)
MCH: 29.2 pg (ref 26.0–34.0)
MCHC: 30.4 g/dL (ref 30.0–36.0)
MCV: 96 fL (ref 80.0–100.0)
Platelets: 94 10*3/uL — ABNORMAL LOW (ref 150–400)
RBC: 2.74 MIL/uL — ABNORMAL LOW (ref 3.87–5.11)
RDW: 19.7 % — ABNORMAL HIGH (ref 11.5–15.5)
WBC: 1.2 10*3/uL — CL (ref 4.0–10.5)
nRBC: 330.6 % — ABNORMAL HIGH (ref 0.0–0.2)

## 2020-12-01 LAB — BLOOD GAS, VENOUS
Acid-base deficit: 7.6 mmol/L — ABNORMAL HIGH (ref 0.0–2.0)
Bicarbonate: 22.4 mmol/L (ref 20.0–28.0)
FIO2: 0.7
MECHVT: 400 mL
Mechanical Rate: 16
O2 Saturation: 57.1 %
PEEP: 5 cmH2O
Patient temperature: 37
RATE: 16 resp/min
pCO2, Ven: 72 mmHg (ref 44.0–60.0)
pH, Ven: 7.1 — CL (ref 7.250–7.430)
pO2, Ven: 42 mmHg (ref 32.0–45.0)

## 2020-12-01 LAB — HEPATIC FUNCTION PANEL
ALT: 16 U/L (ref 0–44)
AST: 62 U/L — ABNORMAL HIGH (ref 15–41)
Albumin: 1.1 g/dL — ABNORMAL LOW (ref 3.5–5.0)
Alkaline Phosphatase: 84 U/L (ref 38–126)
Bilirubin, Direct: 0.4 mg/dL — ABNORMAL HIGH (ref 0.0–0.2)
Indirect Bilirubin: 1 mg/dL — ABNORMAL HIGH (ref 0.3–0.9)
Total Bilirubin: 1.4 mg/dL — ABNORMAL HIGH (ref 0.3–1.2)
Total Protein: 3.3 g/dL — ABNORMAL LOW (ref 6.5–8.1)

## 2020-12-01 LAB — BPAM RBC
Blood Product Expiration Date: 202203032359
Blood Product Expiration Date: 202203032359
ISSUE DATE / TIME: 202202011613
ISSUE DATE / TIME: 202202011613
Unit Type and Rh: 5100
Unit Type and Rh: 5100

## 2020-12-01 LAB — PROTIME-INR
INR: 4 — ABNORMAL HIGH (ref 0.8–1.2)
Prothrombin Time: 37.7 seconds — ABNORMAL HIGH (ref 11.4–15.2)

## 2020-12-01 LAB — GLUCOSE, CAPILLARY
Glucose-Capillary: 26 mg/dL — CL (ref 70–99)
Glucose-Capillary: 380 mg/dL — ABNORMAL HIGH (ref 70–99)
Glucose-Capillary: 417 mg/dL — ABNORMAL HIGH (ref 70–99)

## 2020-12-01 LAB — PROCALCITONIN: Procalcitonin: 1.06 ng/mL

## 2020-12-01 LAB — HEMOGLOBIN AND HEMATOCRIT, BLOOD
HCT: 31.6 % — ABNORMAL LOW (ref 36.0–46.0)
Hemoglobin: 9.7 g/dL — ABNORMAL LOW (ref 12.0–15.0)

## 2020-12-01 LAB — APTT: aPTT: <24 seconds — ABNORMAL LOW (ref 24–36)

## 2020-12-01 LAB — MAGNESIUM: Magnesium: 2.2 mg/dL (ref 1.7–2.4)

## 2020-12-01 LAB — PHOSPHORUS: Phosphorus: 10.7 mg/dL — ABNORMAL HIGH (ref 2.5–4.6)

## 2020-12-01 MED ORDER — SODIUM BICARBONATE 8.4 % IV SOLN
INTRAVENOUS | Status: AC
  Administered 2020-12-01: 100 meq via INTRAVENOUS
  Filled 2020-12-01: qty 100

## 2020-12-01 MED ORDER — CALCIUM GLUCONATE-NACL 1-0.675 GM/50ML-% IV SOLN
1.0000 g | Freq: Once | INTRAVENOUS | Status: AC
  Administered 2020-12-01: 1000 mg via INTRAVENOUS
  Filled 2020-12-01: qty 50

## 2020-12-01 MED ORDER — INSULIN ASPART 100 UNIT/ML ~~LOC~~ SOLN: 0.0000 [IU] | SUBCUTANEOUS | Status: DC

## 2020-12-01 MED ORDER — GLYCOPYRROLATE 1 MG PO TABS
1.0000 mg | ORAL_TABLET | ORAL | Status: DC | PRN
  Filled 2020-12-01: qty 1

## 2020-12-01 MED ORDER — DOCUSATE SODIUM 50 MG/5ML PO LIQD: 100.0000 mg | Freq: Two times a day (BID) | ORAL | Status: DC

## 2020-12-01 MED ORDER — ACETAMINOPHEN 650 MG RE SUPP: 650.0000 mg | Freq: Four times a day (QID) | RECTAL | Status: DC | PRN

## 2020-12-01 MED ORDER — SODIUM BICARBONATE 8.4 % IV SOLN: 50.0000 meq | Freq: Once | INTRAVENOUS | Status: DC

## 2020-12-01 MED ORDER — EPINEPHRINE HCL 5 MG/250ML IV SOLN IN NS
0.5000 ug/min | INTRAVENOUS | Status: DC
  Administered 2020-12-01: 12:00:00 20 ug/min via INTRAVENOUS
  Administered 2020-12-01: 0.5 ug/min via INTRAVENOUS
  Administered 2020-12-01: 08:00:00 20 ug/min via INTRAVENOUS
  Filled 2020-12-01 (×3): qty 250

## 2020-12-01 MED ORDER — SODIUM BICARBONATE 8.4 % IV SOLN
100.0000 meq | Freq: Once | INTRAVENOUS | Status: AC
  Administered 2020-12-01: 100 meq via INTRAVENOUS

## 2020-12-01 MED ORDER — GLYCOPYRROLATE 0.2 MG/ML IJ SOLN: 0.2000 mg | INTRAMUSCULAR | Status: DC | PRN

## 2020-12-01 MED ORDER — DEXTROSE 5 % IV SOLN: INTRAVENOUS | Status: DC

## 2020-12-01 MED ORDER — DEXTROSE 50 % IV SOLN
INTRAVENOUS | Status: AC
  Administered 2020-12-01: 50 mL via INTRAVENOUS
  Filled 2020-12-01: qty 100

## 2020-12-01 MED ORDER — SODIUM BICARBONATE 8.4 % IV SOLN: 100.0000 meq | Freq: Once | INTRAVENOUS | Status: AC

## 2020-12-01 MED ORDER — LACTATED RINGERS IV BOLUS: 1000.0000 mL | Freq: Once | INTRAVENOUS | Status: DC

## 2020-12-01 MED ORDER — VITAMIN K1 10 MG/ML IJ SOLN
10.0000 mg | Freq: Once | INTRAVENOUS | Status: AC
  Administered 2020-12-01: 10 mg via INTRAVENOUS
  Filled 2020-12-01: qty 1

## 2020-12-01 MED ORDER — DIPHENHYDRAMINE HCL 50 MG/ML IJ SOLN: 25.0000 mg | INTRAMUSCULAR | Status: DC | PRN

## 2020-12-01 MED ORDER — POLYVINYL ALCOHOL 1.4 % OP SOLN
1.0000 [drp] | Freq: Four times a day (QID) | OPHTHALMIC | Status: DC | PRN
  Filled 2020-12-01: qty 15

## 2020-12-01 MED ORDER — ACETAMINOPHEN 325 MG PO TABS: 650.0000 mg | ORAL_TABLET | Freq: Four times a day (QID) | ORAL | Status: DC | PRN

## 2020-12-01 MED ORDER — DEXTROSE 50 % IV SOLN: 1.0000 | Freq: Once | INTRAVENOUS | Status: DC

## 2020-12-01 MED ORDER — INSULIN ASPART 100 UNIT/ML IV SOLN: 10.0000 [IU] | Freq: Once | INTRAVENOUS | Status: DC

## 2020-12-01 MED ORDER — DEXTROSE 50 % IV SOLN: 1.0000 | Freq: Once | INTRAVENOUS | Status: AC

## 2020-12-05 LAB — CULTURE, BLOOD (ROUTINE X 2)
Culture: NO GROWTH
Culture: NO GROWTH
Special Requests: ADEQUATE

## 2020-12-06 ENCOUNTER — Inpatient Hospital Stay: Payer: Medicare HMO

## 2020-12-06 ENCOUNTER — Inpatient Hospital Stay: Payer: Medicare HMO | Admitting: Oncology

## 2020-12-08 ENCOUNTER — Ambulatory Visit: Payer: Medicare HMO | Admitting: Oncology

## 2020-12-08 ENCOUNTER — Ambulatory Visit: Payer: Medicare HMO

## 2020-12-08 ENCOUNTER — Other Ambulatory Visit: Payer: Medicare HMO

## 2020-12-15 ENCOUNTER — Ambulatory Visit: Payer: Medicare HMO | Admitting: Oncology

## 2020-12-15 ENCOUNTER — Ambulatory Visit: Payer: Medicare HMO

## 2020-12-15 ENCOUNTER — Other Ambulatory Visit: Payer: Medicare HMO

## 2020-12-28 ENCOUNTER — Other Ambulatory Visit: Payer: Medicare HMO | Admitting: Primary Care

## 2020-12-28 NOTE — Death Summary Note (Signed)
DEATH SUMMARY   Patient Details  Name: Martha Butler MRN: [id number] DOB: 12-Oct-1935  Admission/Discharge Information   Admit Date:  December 27, 2020  Date of Death:   12-28-2020   Time of Death:  12:48PM  Length of Stay: 1  Referring Physician: Enid Baas, MD   Reason(s) for Hospitalization  COVID 19 pneumonia ARDS  Diagnoses  Preliminary cause of death: COVID 19 pneumonia, acute ischemic CVA Secondary Diagnoses (including complications and co-morbidities):  Active Problems:   ARDS (adult respiratory distress syndrome) (HCC)   86 yo female with previous dx of COVID 19 pneumonia and recent admission with underlying diagnosis of  SVT (supraventricular tachycardia) (HCC) [I47.1] New onset a-fib (HCC) [I48.91] AKI (acute kidney injury) (HCC) [N17.9] Pneumonia due to COVID-19 virus [U07.1, J12.82] Acute hypoxic respiratory failure secondary to COVID-19 pneumonia  Was discharged 2 days ago Family states that pt has been feeling unwell over the past several days then became unresponsive today. becoming unresponsive at home with family patient was having increasing trouble breathing throughout the day today and eventually lost consciousness.  ER COURSE Patient was emergently intubated pt is now on maximal doses of 3 vasopressors and still remains hypotensive.  Pt HIGH RISK for CARDIAC ARREST AND SUDDEN DEATH.    Contacted pts son Emmalou Hunger via telephone and informed him the pt will likely NOT  survive this hospitalization.  Informed Mr. Smotherman at this point pt is receiving maximal support and receiving aggressive care, however she continues to decline.  Instructed him along with his siblings to come to the hospital to discuss pts code status and goals of care.  Pts son aware pt is HIGH RISK FOR CARDIAC ARREST and SUDDEN DEATH   GOALS OF CARE DISCUSSION  The Clinical status was relayed to family in detail. Son at bedside  Updated and notified of patients medical  condition.  Patient remains unresponsive and will not open eyes to command.    Patient is having a weak cough and struggling to remove secretions.   Patient with increased WOB and using accessory muscles to breathe Explained to family course of therapy and the modalities     Patient with Progressive multiorgan failure with a very high probablity of a very minimal chance of meaningful recovery despite all aggressive and optimal medical therapy. Patient is in the Dying  Process associated with Suffering.  Family understands the situation.  They have consented and agreed to DNR/DNI and would like to proceed with Comfort care measures.  Family are satisfied with Plan of action and management. All questions answered       Pertinent Labs and Studies  Significant Diagnostic Studies CT Head Wo Contrast  Addendum Date: 2020/12/27   ADDENDUM REPORT: 2020/12/27 17:47 ADDENDUM: These results were called by telephone at the time of interpretation on December 27, 2020 at 5:47 pm to provider [facility] , who verbally acknowledged these results. Electronically Signed   By: Jackey Loge DO   On: 12/27/20 17:47   Result Date: 2020-12-27 CLINICAL DATA:  Mental status change, unknown cause. Additional history provided: Unresponsive, COVID pneumonia, on ventilator. EXAM: CT HEAD WITHOUT CONTRAST TECHNIQUE: Contiguous axial images were obtained from the base of the skull through the vertex without intravenous contrast. COMPARISON:  No pertinent prior exams available for comparison. FINDINGS: Brain: Mild-to-moderate cerebral atrophy. Suspected 2.5 cm acute cortical infarct within the mid right frontal lobe/frontal operculum (series 2, image 14) (series 5, image 15). 1.7 x 1.1 cm infarct within the left cerebellar hemisphere which appears acute (  series 2, image 9) (series 4, image 53). Background moderate ill-defined hypoattenuation within the cerebral white matter is nonspecific, but compatible with chronic small  vessel ischemic disease. No extra-axial fluid collection. No evidence of intracranial mass. No midline shift. Vascular: No hyperdense vessel.  Atherosclerotic calcifications Skull: Normal. Negative for fracture or focal lesion. Sinuses/Orbits: Visualized orbits show no acute finding. Complete opacification of a hypoplastic right frontal sinus. The paranasal sinuses are otherwise normally aerated at the imaged levels. IMPRESSION: 1.7 x 1.1 cm acute appearing infarct within the left cerebellar hemisphere. Suspected 2.5 cm acute cortical infarct within the mid right frontal lobe/frontal operculum. Consider brain MRI for confirmation and for further evaluation. Background mild-to-moderate cerebral atrophy and moderate chronic small vessel ischemic disease. Chronic right frontal sinusitis. Electronically Signed: By: Jackey LogeKyle  Golden DO On: Dec 13, 2020 17:43   CT Angio Chest PE W/Cm &/Or Wo Cm  Result Date: 12/27/2020 CLINICAL DATA:  PE suspected.  COVID pneumonia.  Unresponsive. EXAM: CT ANGIOGRAPHY CHEST WITH CONTRAST TECHNIQUE: Multidetector CT imaging of the chest was performed using the standard protocol during bolus administration of intravenous contrast. Multiplanar CT image reconstructions and MIPs were obtained to evaluate the vascular anatomy. CONTRAST:  50mL OMNIPAQUE IOHEXOL 350 MG/ML SOLN COMPARISON:  November 25, 2020. FINDINGS: Cardiovascular: Contrast injection is sufficient to demonstrate satisfactory opacification of the pulmonary arteries to the segmental level. There is no pulmonary embolus or evidence of right heart strain. The size of the main pulmonary artery is enlarged, measuring 3.8 cm. Normal heart size with coronary artery calcification. The course and caliber of the aorta are normal. There is no atherosclerotic calcification. Opacification decreased due to pulmonary arterial phase contrast bolus timing. Mediastinum/Nodes: -- No mediastinal lymphadenopathy. -- No hilar lymphadenopathy. -- No  axillary lymphadenopathy. -- No supraclavicular lymphadenopathy. -- Normal thyroid gland where visualized. -the enteric tube tip terminates in the proximal stomach. Lungs/Pleura: There are worsening bilateral airspace opacities and consolidation. There are small bilateral pleural effusions. No pneumothorax. The trachea is unremarkable. The endotracheal tube terminates above the carina. Upper Abdomen: Contrast bolus timing is not optimized for evaluation of the abdominal organs. The visualized portions of the organs of the upper abdomen are normal. Musculoskeletal: No chest wall abnormality. No bony spinal canal stenosis. Review of the MIP images confirms the above findings. IMPRESSION: 1. No acute pulmonary embolism. 2. Worsening bilateral airspace opacities and consolidation with small bilateral pleural effusions. 3. Enlarged size of the main pulmonary artery, which can be seen in patients with elevated pulmonary artery pressures. Electronically Signed   By: Katherine Mantlehristopher  Green M.D.   On: Dec 13, 2020 17:35   CT ANGIO CHEST PE W OR WO CONTRAST  Result Date: 11/25/2020 CLINICAL DATA:  COVID.  Fatigue. EXAM: CT ANGIOGRAPHY CHEST WITH CONTRAST TECHNIQUE: Multidetector CT imaging of the chest was performed using the standard protocol during bolus administration of intravenous contrast. Multiplanar CT image reconstructions and MIPs were obtained to evaluate the vascular anatomy. CONTRAST:  75mL OMNIPAQUE IOHEXOL 350 MG/ML SOLN COMPARISON:  Chest x-ray 11/22/2020 FINDINGS: Cardiovascular: No filling defects in the pulmonary arteries to suggest pulmonary emboli. Mild cardiomegaly. No evidence of aortic aneurysm. Coronary artery calcifications. Mediastinum/Nodes: No mediastinal, hilar, or axillary adenopathy. Trachea and esophagus are unremarkable. Thyroid unremarkable. Lungs/Pleura: Extensive bilateral ground-glass airspace opacities compatible with COVID pneumonia. No effusions. Upper Abdomen: Imaging into the upper  abdomen demonstrates no acute findings. Musculoskeletal: Chest wall soft tissues are unremarkable. No acute bony abnormality. Review of the MIP images confirms the above findings. IMPRESSION: Extensive bilateral  airspace disease compatible with COVID pneumonia. No evidence of pulmonary embolus. Coronary artery disease. Electronically Signed   By: Charlett Nose M.D.   On: 11/25/2020 17:19   US Venous Img Lower Bilateral (DVT)  Result Date: 11/26/2020 CLINICAL DATA:  Shortness of breath, edema EXAM: BILATERAL LOWER EXTREMITY VENOUS DOPPLER ULTRASOUND TECHNIQUE: Gray-scale sonography with graded compression, as well as color Doppler and duplex ultrasound were performed to evaluate the lower extremity deep venous systems from the level of the common femoral vein and including the common femoral, femoral, profunda femoral, popliteal and calf veins including the posterior tibial, peroneal and gastrocnemius veins when visible. The superficial great saphenous vein was also interrogated. Spectral Doppler was utilized to evaluate flow at rest and with distal augmentation maneuvers in the common femoral, femoral and popliteal veins. COMPARISON:  None. FINDINGS: RIGHT LOWER EXTREMITY Common Femoral Vein: No evidence of thrombus. Normal compressibility, respiratory phasicity and response to augmentation. Saphenofemoral Junction: No evidence of thrombus. Normal compressibility and flow on color Doppler imaging. Profunda Femoral Vein: No evidence of thrombus. Normal compressibility and flow on color Doppler imaging. Femoral Vein: No evidence of thrombus. Normal compressibility, respiratory phasicity and response to augmentation. Popliteal Vein: No evidence of thrombus. Normal compressibility, respiratory phasicity and response to augmentation. Calf Veins: Limited assessment because of peripheral edema. No gross thrombus evident. LEFT LOWER EXTREMITY Common Femoral Vein: No evidence of thrombus. Normal compressibility,  respiratory phasicity and response to augmentation. Saphenofemoral Junction: No evidence of thrombus. Normal compressibility and flow on color Doppler imaging. Profunda Femoral Vein: No evidence of thrombus. Normal compressibility and flow on color Doppler imaging. Femoral Vein: No evidence of thrombus. Normal compressibility, respiratory phasicity and response to augmentation. Popliteal Vein: No evidence of thrombus. Normal compressibility, respiratory phasicity and response to augmentation. Calf Veins: Limited assessment because of peripheral edema. No gross thrombus evident. IMPRESSION: No significant femoropopliteal DVT in either extremity. Limited assessment of the calf veins because of peripheral edema. Electronically Signed   By: Judie Petit.  Shick M.D.   On: 11/26/2020 15:19   DG CHEST PORT 1 VIEW  Result Date: 12/06/2020 CLINICAL DATA:  86 year old female with unresponsiveness. EXAM: PORTABLE CHEST 1 VIEW COMPARISON:  Chest radiograph dated 12/04/2020 and CT dated 12/02/2020 FINDINGS: Endotracheal tube with tip approximately 5 cm above the carina. Left IJ central venous line with tip over central SVC. Enteric tube with side-port above the diaphragm and tip beyond the inferior margin of the image. Recommend further advancing of the tube by at least additional 5 cm. Bilateral pulmonary opacities, progressed since the prior radiograph. No large pleural effusion. No pneumothorax. Stable cardiomediastinal silhouette. No acute osseous pathology. IMPRESSION: 1. Endotracheal tube above the carina. 2. Enteric tube with side-port above the GE junction in the distal esophagus. Recommend further advancing by at least additional 5 cm. 3. Worsening bilateral pulmonary opacities. Electronically Signed   By: Elgie Collard M.D.   On: 12/10/2020 21:10   DG Chest Portable 1 View  Result Date: 12/16/2020 CLINICAL DATA:  Post intubation EXAM: PORTABLE CHEST 1 VIEW COMPARISON:  11/22/2020 FINDINGS: The endotracheal tube  terminates above the carina. The enteric tube is malpositioned with the tip terminating near the GE junction and the side hole within the esophagus. There are diffuse bilateral hazy airspace opacities which have worsened since the prior study. There is no pneumothorax. No definite large pleural effusion. No acute osseous abnormality. The heart size is stable. IMPRESSION: 1. Endotracheal tube terminates above the carina. 2. Enteric tube should be further advanced  into the stomach. 3. Worsening diffuse bilateral hazy airspace opacities. 4. No pneumothorax. These results will be called to the ordering clinician or representative by the Radiologist Assistant, and communication documented in the PACS or [employer]. Electronically Signed   By: Katherine Mantle M.D.   On: 2020/12/28 15:51   DG Chest Port 1 View  Result Date: 11/22/2020 CLINICAL DATA:  86 year old female with concern for sepsis. EXAM: PORTABLE CHEST 1 VIEW COMPARISON:  Chest radiograph dated 10/12/2020. FINDINGS: Bilateral streaky densities most consistent with pneumonia, possibly viral or atypical in etiology including COVID-19. Clinical correlation is recommended. No pleural effusion or pneumothorax. There is mild eventration of the left hemidiaphragm. Stable cardiac silhouette. No acute osseous pathology. IMPRESSION: Multifocal pneumonia. Electronically Signed   By: Elgie Collard M.D.   On: 11/22/2020 17:04   ECHOCARDIOGRAM COMPLETE  Result Date: 11/23/2020    ECHOCARDIOGRAM REPORT   Patient Name:   ALITA WALDREN Date of Exam: 11/23/2020 Medical Rec #:  [id number]       Height:       67.0 in Accession #:    [id number]      Weight:       135.8 lb Date of Birth:  [birth year]       BSA:          1.715 m Patient Age:    85 years        BP:           80/61 mmHg Patient Gender: F               HR:           78 bpm. Exam Location:  [facility] Procedure: 2D Echo, Limited Color Doppler and Cardiac Doppler Indications:     R94.31 Abnormal ECG  History:          Patient has no prior history of Echocardiogram examinations.                  Signs/Symptoms:Shortness of Breath; Risk Factors:Hypertension.                  Pt tested positive for COVID-19 on 11/22/20.  Sonographer:     Humphrey Rolls RDCS (AE) Referring Phys:  [id number] Lilia Pro Diagnosing Phys: Yvonne Kendall MD  Sonographer Comments: Technically challenging study due to limited acoustic windows, no parasternal window, suboptimal apical window and suboptimal subcostal window. IMPRESSIONS  1. Left ventricular ejection fraction, by estimation, is >55%. Left ventricular endocardial border not optimally defined to evaluate regional wall motion. Left ventricular diastolic parameters are consistent with Grade III diastolic dysfunction (restrictive).  2. Pulmonary artery pressure is at least moderately elevated (40-45 mmHg plus central venous pressure). Right ventricular systolic function was not well visualized. The right ventricular size is not well visualized.  3. The mitral valve was not well visualized. Moderate to severe mitral valve regurgitation. No evidence of mitral stenosis.  4. Tricuspid valve regurgitation is moderate to severe.  5. The aortic valve was not well visualized. Aortic valve regurgitation not assessed.  6. Pulmonic valve regurgitation not assessed. FINDINGS  Left Ventricle: Unable to assess left ventricular size and wall thickness. Left ventricular ejection fraction, by estimation, is >55%. Left ventricular endocardial border not optimally defined to evaluate regional wall motion. Left ventricular diastolic  parameters are consistent with Grade III diastolic dysfunction (restrictive). Right Ventricle: Pulmonary artery pressure is at least moderately elevated (40-45 mmHg plus central venous pressure). The right ventricular size is not well  visualized. Right vetricular wall thickness was not assessed. Right ventricular systolic function  was not well visualized. Left Atrium: Left  atrial size was not well visualized. Right Atrium: Right atrial size was not well visualized. Pericardium: There is no evidence of pericardial effusion. Mitral Valve: The mitral valve was not well visualized. Moderate to severe mitral valve regurgitation. No evidence of mitral valve stenosis. MV peak gradient, 8.5 mmHg. The mean mitral valve gradient is 2.0 mmHg. Tricuspid Valve: The tricuspid valve is not well visualized. Tricuspid valve regurgitation is moderate to severe. Aortic Valve: The aortic valve was not well visualized. Aortic valve regurgitation not assessed. Pulmonic Valve: The pulmonic valve was not well visualized. Pulmonic valve regurgitation not assessed. Aorta: The aortic root was not well visualized. Venous: The inferior vena cava was not well visualized. IAS/Shunts: The interatrial septum was not well visualized.   LV Volumes (MOD) LV vol d, MOD A4C: 48.5 ml Diastology LV vol s, MOD A4C: 22.4 ml LV e' medial:    6.85 cm/s LV SV MOD A4C:     48.5 ml LV E/e' medial:  14.2                            LV e' lateral:   8.59 cm/s                            LV E/e' lateral: 11.3  MITRAL VALVE               TRICUSPID VALVE MV Area (PHT): 3.45 cm    TR Peak grad:   44.6 mmHg MV Peak grad:  8.5 mmHg    TR Vmax:        334.00 cm/s MV Mean grad:  2.0 mmHg MV Vmax:       1.46 m/s MV Vmean:      60.3 cm/s MV Decel Time: 220 msec MV E velocity: 97.30 cm/s MV A velocity: 30.80 cm/s MV E/A ratio:  3.16 Cristal Deer End MD Electronically signed by Yvonne Kendall MD Signature Date/Time: 11/23/2020/1:02:21 PM    Final     Microbiology Recent Results (from the past 240 hour(s))  Urine culture     Status: Abnormal   Collection Time: 11/22/20  3:05 PM   Specimen: Urine, Random  Result Value Ref Range Status   Specimen Description   Final    URINE, RANDOM Performed at [facility], [address], [place], [place] [address]    Special Requests   Final    NONE Performed at [facility],  [address], [place], [place] [address]    Culture (A)  Final    <10,000 COLONIES/mL INSIGNIFICANT GROWTH Performed at [facility], [address] [place], [place] [address]    Report Status 11/25/2020 FINAL  Final  Blood Culture (routine x 2)     Status: None   Collection Time: 11/22/20  4:49 PM   Specimen: BLOOD  Result Value Ref Range Status   Specimen Description BLOOD BLOOD LEFT ARM  Final   Special Requests   Final    BOTTLES DRAWN AEROBIC AND ANAEROBIC Blood Culture adequate volume   Culture   Final    NO GROWTH 5 DAYS Performed at [facility], [address] [place], [place] [address]    Report Status 11/27/2020 FINAL  Final  Blood Culture (routine x 2)     Status:  None   Collection Time: 11/22/20  4:49 PM   Specimen: BLOOD  Result Value Ref Range Status   Specimen Description BLOOD LEFT ANTECUBITAL  Final   Special Requests   Final    BOTTLES DRAWN AEROBIC AND ANAEROBIC Blood Culture adequate volume   Culture   Final    NO GROWTH 5 DAYS Performed at [facility], [address] [place], [place] [address]    Report Status 11/27/2020 FINAL  Final  Culture, blood (routine x 2)     Status: None (Preliminary result)   Collection Time: 12-02-2020  3:19 PM   Specimen: BLOOD  Result Value Ref Range Status   Specimen Description BLOOD LEFT ANTECUBITAL  Final   Special Requests   Final    BOTTLES DRAWN AEROBIC AND ANAEROBIC Blood Culture results may not be optimal due to an inadequate volume of blood received in culture bottles   Culture   Final    NO GROWTH < 24 HOURS Performed at [facility], [address], [place], [place] [address]    Report Status PENDING  Incomplete  Culture, blood (routine x 2)     Status: None (Preliminary result)   Collection Time: 02-Dec-2020  3:42 PM   Specimen: BLOOD  Result Value Ref Range Status   Specimen Description BLOOD LEFT ANTECUBITAL  Final   Special Requests   Final    BOTTLES DRAWN  AEROBIC AND ANAEROBIC Blood Culture adequate volume   Culture   Final    NO GROWTH < 24 HOURS Performed at [facility], [address] [place], [place] [address]    Report Status PENDING  Incomplete  MRSA PCR Screening     Status: None   Collection Time: 2020-12-02  8:46 PM   Specimen: Nasopharyngeal  Result Value Ref Range Status   MRSA by PCR NEGATIVE NEGATIVE Final    Comment:        The GeneXpert MRSA Assay (FDA approved for NASAL specimens only), is one component of a comprehensive MRSA colonization surveillance program. It is not intended to diagnose MRSA infection nor to guide or monitor treatment for MRSA infections. Performed at [facility], [address] [place], [place] [address]     Lab Basic Metabolic Panel: Recent Labs  Lab 11/26/20 [phone] 11/27/20 0600 11/28/20 0349 2020/12/02 1519 02-Dec-2020 1800 12/02/20 2002 12/04/2020 0347  NA 152* 152* 150* 152*  --  151* 158*  K 3.5 3.4*  --  5.5*  --  4.7 4.8  CL 115* 118*  --  118*  --  117* 112*  CO2 25 23  --  15*  --  19* 25  GLUCOSE 225* 121*  --  235*  --  308* 669*  BUN 57* 51*  --  72*  --  71* 76*  CREATININE 1.38* 1.29*  --  2.08* 1.95* 1.90* 2.64*  CALCIUM 7.5* 7.5*  --  7.4*  --  6.7* 6.0*  MG  --   --   --   --   --   --  2.2  PHOS  --   --   --   --   --   --  10.7*   Liver Function Tests: Recent Labs  Lab 11/25/20 0751 11/26/20 0651 11/27/20 0600 2020/12/02 1519 12/10/2020 0347  AST 34 26 21 68* 62*  ALT 18 16 15 24 16   ALKPHOS 78 92 78 94 84  BILITOT 0.5 0.4 0.6 1.4* 1.4*  PROT 6.8 6.8 6.1*  5.8* 3.3*  ALBUMIN 2.6* 2.5* 2.4* 2.0* 1.1*   No results for input(s): LIPASE, AMYLASE in the last 168 hours. No results for input(s): AMMONIA in the last 168 hours. CBC: Recent Labs  Lab 11/28/20 0349 12/25/2020 1519 12/08/2020 1800 December 05, 2020 0210 12/05/2020 0510  WBC 0.6* 0.9* 1.1*  --  1.2*  HGB 7.5* 5.6* 8.3* 9.7* 8.0*  HCT 23.2* 17.8* 26.2* 31.6* 26.3*  MCV 104.0* 111.9* 94.6   --  96.0  PLT 421* 202 180  --  94*   Cardiac Enzymes: No results for input(s): CKTOTAL, CKMB, CKMBINDEX, TROPONINI in the last 168 hours. Sepsis Labs: Recent Labs  Lab 11/28/20 0349 12/26/2020 1519 12/09/2020 1800 12/21/2020 2230 12/05/2020 0347 December 05, 2020 0510  PROCALCITON  --  0.29  --   --  1.06  --   WBC 0.6* 0.9* 1.1*  --   --  1.2*  LATICACIDVEN  --  10.1* 6.1* 4.1*  --   --      Tiaunna Buford 05-Dec-2020, 12:56 PM

## 2020-12-28 NOTE — Progress Notes (Signed)
  Chaplain On-Call responded to pager notification from [employer], who reported that the patient will be placed on comfort care status and that several family members are present.  Chaplain received medical update from [employer].  Chaplain met three daughters of the patient at bedside. Provided spiritual and emotional support and prayer. Assured them of further availability of Chaplains as needed.  [place] Doron Shake M.Div., [facility]

## 2020-12-28 NOTE — Progress Notes (Signed)
Patient extubated to comfort care per MD order.

## 2020-12-28 NOTE — Progress Notes (Signed)
GOALS OF CARE DISCUSSION  The Clinical status was relayed to family in detail. Family at bedside  Updated and notified of patients medical condition.  Patient remains unresponsive and will not open eyes to command.    Patient is having a weak cough and struggling to remove secretions.   Patient with increased WOB and using accessory muscles to breathe Explained to family course of therapy and the modalities     Patient with Progressive multiorgan failure with a very high probablity of a very minimal chance of meaningful recovery despite all aggressive and optimal medical therapy. Patient is in the Dying  Process associated with Suffering.  Family understands the situation.  They have consented and agreed to DNR/DNI and would like to proceed with Comfort care measures.  Family are satisfied with Plan of action and management. All questions answered  Additional CC time 32 mins   Kamla Skilton Santiago Glad, M.D.  [facility]  Medical Director [facility] Medical Director [facility] Cardio-Pulmonary Department

## 2020-12-28 NOTE — Progress Notes (Signed)
Bair hugger warming blanket applied to patient at this time.

## 2020-12-28 NOTE — Progress Notes (Signed)
Dr. Mortimer Fries met with patient's family to discuss goals of care for patient and to provide update on clinical status. Patient continues to require max doses of Epi, Norepi, Vasopressin, and Phenylephrine gtts at this time. Intermittently able to get BP via cuff pressures. SBP ranging from 30-70s/10-30s. Unable to obtain SpO2 due to low peripheral perfusion. Patient's hands cool to touch, and fingers are progressively getting more dusky. Family has agreed to make pt DNR at this time. Per Dr. Mortimer Fries, no escalation/increased titration of vasoactive medications. Unable to get CBGs due to lack of perfusion to fingers, stopping BG checks at this time per Dr. Mortimer Fries.

## 2020-12-28 NOTE — Progress Notes (Signed)
Patients family requested comfort care measures only and would like to proceed with terminal extubation and discontinuation of vasoactive gtts. Comfort measure orders placed; Family at bedside for extubation and gtt discontinuation.

## 2020-12-28 NOTE — Progress Notes (Signed)
Spoke with pts son Lluvia Gwynne via telephone informed him pt is now on maximal doses of 3 vasopressors and still remains hypotensive.  Pt HIGH RISK for CARDIAC ARREST AND SUDDEN DEATH.  Discussed code status and goals of treatment at this time per Mr. Pipkins request pt to remain FULL CODE and continue AGGRESSIVE TREATMENT.  Telephone consent obtained to proceed with arterial line placement.  Attempted to place right femoral arterial line, however unable to advance guidewire.  Therefore, attempted to place left femoral arterial line and unable to advance guidewire.  Pts sbp 74 with map of 54, placed order for epinephrine gtt.  Will continue to monitor and assess pt.   Additional critical care time 30 minutes   Sonda Rumble, AGNP  Pulmonary/Critical Care Pager [phone] (please enter 7 digits) PCCM Consult Pager [phone] (please enter 7 digits)

## 2020-12-28 NOTE — Progress Notes (Signed)
Contacted pts son Pier Laux via telephone and informed him the pt will likely NOT  survive this hospitalization.  Informed Mr. Fulbright at this point pt is receiving maximal support and receiving aggressive care, however she continues to decline.  Instructed him along with his siblings to come to the hospital to discuss pts code status and goals of care.  Pts son aware pt is HIGH RISK FOR CARDIAC ARREST and SUDDEN DEATH. Mr. Meeuwsen acknowledged this and stated he would arrive at the hospital in 20 minutes.  Sonda Rumble, AGNP  Pulmonary/Critical Care Pager [phone] (please enter 7 digits) PCCM Consult Pager [phone] (please enter 7 digits)

## 2020-12-28 NOTE — Progress Notes (Signed)
CRITICAL CARE NOTE    CC  follow up respiratory failure  SUBJECTIVE Patient remains critically ill Prognosis is guarded   Patient suffering and dying from COVID 19 pneumonia and stroke, with progressive multiorgan failure   BP (!) 36/27   Pulse (!) 34   Temp 98.6 F (37 C)   Resp (!) 22   Wt 64.1 kg   SpO2 (!) 54%   BMI 22.13 kg/m    I/O last 3 completed shifts: In: 1250 [IV Piggyback:1250] Out: 150 [Urine:150] No intake/output data recorded.  SpO2: (!) 54 % O2 Flow Rate (L/min): 15 L/min FiO2 (%): (S) 100 % (CHANGED DUE TO DROP IN SATURATION)  Estimated body mass index is 22.13 kg/m as calculated from the following:   Height as of 11/24/20: 5\' 7"  (1.702 m).   Weight as of this encounter: 64.1 kg.  SIGNIFICANT EVENTS   REVIEW OF SYSTEMS  PATIENT IS UNABLE TO PROVIDE COMPLETE REVIEW OF SYSTEMS DUE TO SEVERE CRITICAL ILLNESS       PHYSICAL EXAMINATION: GENERAL:critically ill appearing, +resp distress NECK: Supple.  PULMONARY: +rhonchi, +wheezing CARDIOVASCULAR: S1 and S2.  GASTROINTESTINAL: Soft, nontender, +Positive bowel sounds.  MUSCULOSKELETAL: No swelling, clubbing, or edema.  NEUROLOGIC: obtunded, GCS<8 SKIN:intact,warm,dry     MEDICATIONS: I have reviewed all medications and confirmed regimen as documented   CULTURE RESULTS   Recent Results (from the past 240 hour(s))  Urine culture     Status: Abnormal   Collection Time: 11/22/20  3:05 PM   Specimen: Urine, Random  Result Value Ref Range Status   Specimen Description   Final    URINE, RANDOM Performed at [facility], [address] [place], [place]    Special Requests   Final    NONE Performed at [facility], [address], [place], [place]    Culture (A)  Final    <10,000 COLONIES/mL INSIGNIFICANT GROWTH Performed at [facility], [address] [place], [place]    Report Status 11/25/2020 FINAL  Final  Blood Culture  (routine x 2)     Status: None   Collection Time: 11/22/20  4:49 PM   Specimen: BLOOD  Result Value Ref Range Status   Specimen Description BLOOD BLOOD LEFT ARM  Final   Special Requests   Final    BOTTLES DRAWN AEROBIC AND ANAEROBIC Blood Culture adequate volume   Culture   Final    NO GROWTH 5 DAYS Performed at [facility], [address] [place], [place]    Report Status 11/27/2020 FINAL  Final  Blood Culture (routine x 2)     Status: None   Collection Time: 11/22/20  4:49 PM   Specimen: BLOOD  Result Value Ref Range Status   Specimen Description BLOOD LEFT ANTECUBITAL  Final   Special Requests   Final    BOTTLES DRAWN AEROBIC AND ANAEROBIC Blood Culture adequate volume   Culture   Final    NO GROWTH 5 DAYS Performed at [facility], [address], [place], [place]    Report Status 11/27/2020 FINAL  Final  MRSA PCR Screening     Status: None   Collection Time: 12/27/2020  8:46 PM   Specimen: Nasopharyngeal  Result Value Ref Range Status   MRSA by PCR NEGATIVE NEGATIVE Final    Comment:        The GeneXpert MRSA Assay (FDA approved for NASAL specimens only), is one component of a  comprehensive MRSA colonization surveillance program. It is not intended to diagnose MRSA infection nor to guide or monitor treatment for MRSA infections. Performed at [facility], [address] [place], [place] [address]           IMAGING    CT Head Wo Contrast  Addendum Date: Dec 16, 2020   ADDENDUM REPORT: Dec 16, 2020 17:47 ADDENDUM: These results were called by telephone at the time of interpretation on 12-16-20 at 5:47 pm to provider [facility] , who verbally acknowledged these results. Electronically Signed   By: Jackey Loge DO   On: December 16, 2020 17:47   Result Date: 12/16/20 CLINICAL DATA:  Mental status change, unknown cause. Additional history provided: Unresponsive, COVID pneumonia, on ventilator. EXAM: CT HEAD WITHOUT  CONTRAST TECHNIQUE: Contiguous axial images were obtained from the base of the skull through the vertex without intravenous contrast. COMPARISON:  No pertinent prior exams available for comparison. FINDINGS: Brain: Mild-to-moderate cerebral atrophy. Suspected 2.5 cm acute cortical infarct within the mid right frontal lobe/frontal operculum (series 2, image 14) (series 5, image 15). 1.7 x 1.1 cm infarct within the left cerebellar hemisphere which appears acute (series 2, image 9) (series 4, image 53). Background moderate ill-defined hypoattenuation within the cerebral white matter is nonspecific, but compatible with chronic small vessel ischemic disease. No extra-axial fluid collection. No evidence of intracranial mass. No midline shift. Vascular: No hyperdense vessel.  Atherosclerotic calcifications Skull: Normal. Negative for fracture or focal lesion. Sinuses/Orbits: Visualized orbits show no acute finding. Complete opacification of a hypoplastic right frontal sinus. The paranasal sinuses are otherwise normally aerated at the imaged levels. IMPRESSION: 1.7 x 1.1 cm acute appearing infarct within the left cerebellar hemisphere. Suspected 2.5 cm acute cortical infarct within the mid right frontal lobe/frontal operculum. Consider brain MRI for confirmation and for further evaluation. Background mild-to-moderate cerebral atrophy and moderate chronic small vessel ischemic disease. Chronic right frontal sinusitis. Electronically Signed: By: Jackey Loge DO On: 12-16-2020 17:43   CT Angio Chest PE W/Cm &/Or Wo Cm  Result Date: 2020-12-16 CLINICAL DATA:  PE suspected.  COVID pneumonia.  Unresponsive. EXAM: CT ANGIOGRAPHY CHEST WITH CONTRAST TECHNIQUE: Multidetector CT imaging of the chest was performed using the standard protocol during bolus administration of intravenous contrast. Multiplanar CT image reconstructions and MIPs were obtained to evaluate the vascular anatomy. CONTRAST:  97mL OMNIPAQUE IOHEXOL 350 MG/ML  SOLN COMPARISON:  November 25, 2020. FINDINGS: Cardiovascular: Contrast injection is sufficient to demonstrate satisfactory opacification of the pulmonary arteries to the segmental level. There is no pulmonary embolus or evidence of right heart strain. The size of the main pulmonary artery is enlarged, measuring 3.8 cm. Normal heart size with coronary artery calcification. The course and caliber of the aorta are normal. There is no atherosclerotic calcification. Opacification decreased due to pulmonary arterial phase contrast bolus timing. Mediastinum/Nodes: -- No mediastinal lymphadenopathy. -- No hilar lymphadenopathy. -- No axillary lymphadenopathy. -- No supraclavicular lymphadenopathy. -- Normal thyroid gland where visualized. -the enteric tube tip terminates in the proximal stomach. Lungs/Pleura: There are worsening bilateral airspace opacities and consolidation. There are small bilateral pleural effusions. No pneumothorax. The trachea is unremarkable. The endotracheal tube terminates above the carina. Upper Abdomen: Contrast bolus timing is not optimized for evaluation of the abdominal organs. The visualized portions of the organs of the upper abdomen are normal. Musculoskeletal: No chest wall abnormality. No bony spinal canal stenosis. Review of the MIP images confirms the above findings. IMPRESSION: 1. No acute pulmonary embolism. 2. Worsening bilateral airspace opacities  and consolidation with small bilateral pleural effusions. 3. Enlarged size of the main pulmonary artery, which can be seen in patients with elevated pulmonary artery pressures. Electronically Signed   By: Katherine Mantle M.D.   On: 12-24-20 17:35   DG CHEST PORT 1 VIEW  Result Date: December 24, 2020 CLINICAL DATA:  86 year old female with unresponsiveness. EXAM: PORTABLE CHEST 1 VIEW COMPARISON:  Chest radiograph dated 12/24/20 and CT dated 12-24-2020 FINDINGS: Endotracheal tube with tip approximately 5 cm above the carina. Left IJ  central venous line with tip over central SVC. Enteric tube with side-port above the diaphragm and tip beyond the inferior margin of the image. Recommend further advancing of the tube by at least additional 5 cm. Bilateral pulmonary opacities, progressed since the prior radiograph. No large pleural effusion. No pneumothorax. Stable cardiomediastinal silhouette. No acute osseous pathology. IMPRESSION: 1. Endotracheal tube above the carina. 2. Enteric tube with side-port above the GE junction in the distal esophagus. Recommend further advancing by at least additional 5 cm. 3. Worsening bilateral pulmonary opacities. Electronically Signed   By: Elgie Collard M.D.   On: 2020/12/24 21:10   DG Chest Portable 1 View  Result Date: 12/24/20 CLINICAL DATA:  Post intubation EXAM: PORTABLE CHEST 1 VIEW COMPARISON:  11/22/2020 FINDINGS: The endotracheal tube terminates above the carina. The enteric tube is malpositioned with the tip terminating near the GE junction and the side hole within the esophagus. There are diffuse bilateral hazy airspace opacities which have worsened since the prior study. There is no pneumothorax. No definite large pleural effusion. No acute osseous abnormality. The heart size is stable. IMPRESSION: 1. Endotracheal tube terminates above the carina. 2. Enteric tube should be further advanced into the stomach. 3. Worsening diffuse bilateral hazy airspace opacities. 4. No pneumothorax. These results will be called to the ordering clinician or representative by the Radiologist Assistant, and communication documented in the PACS or [employer]. Electronically Signed   By: Katherine Mantle M.D.   On: Dec 24, 2020 15:51     Nutrition Status:           Indwelling Urinary Catheter continued, requirement due to   Reason to continue Indwelling Urinary Catheter strict Intake/Output monitoring for hemodynamic instability   Central Line/ continued, requirement due to  Reason to continue  [employer] Monitoring of central venous pressure or other hemodynamic parameters and poor IV access   Ventilator continued, requirement due to severe respiratory failure   Ventilator Sedation RASS 0 to -2      ASSESSMENT AND PLAN SYNOPSIS  Acute hypoxemic respiratory failure due to COVID-19 pneumonia/ARDS Mechanical ventilation via ARDS protocol, target PRVC 6 cc/kg Wean PEEP and FiO2 as able Goal plateau pressure less than 30, driving pressure less than 15 Paralytics if necessary for vent synchrony, gas exchange Cycle prone positioning if necessary for oxygenation Deep sedation per PAD protocol Diuresis as tolerated based on Kidney function VAP prevention order set Remdesivir Therapy IV STEROIDS Therapy Follow inflammatory markers as needed with CRP Vitamin C, zinc Plan to repeat and check resp cultures as needed   Severe ACUTE Hypoxic and Hypercapnic Respiratory Failure -continue Full MV support -continue Bronchodilator Therapy -Wean Fio2 and PEEP as tolerated -VAP/VENT bundle implementation  ACUTE CARDIAC FAILURE- CARDIOGENIC SHOCK WITH SEPSIS FROM COVID -oxygen as needed -Lasix as tolerated   ACUTE KIDNEY INJURY/Renal Failure -continue Foley Catheter-assess need -Avoid nephrotoxic agents -Follow urine output, BMP -Ensure adequate renal perfusion, optimize oxygenation -Renal dose medications     NEUROLOGY Acute toxic metabolic encephalopathy  Acute CVA-poor prognosis  SHOCK- -use vasopressors to keep MAP>65 Maximal pressors   CARDIAC ICU monitoring  ID -continue IV abx as prescibed -follow up cultures  GI GI PROPHYLAXIS as indicated   DIET-->TF's as tolerated Constipation protocol as indicated  ENDO - will use ICU hypoglycemic\Hyperglycemia protocol if indicated    ELECTROLYTES -follow labs as needed -replace as needed -pharmacy consultation and following   DVT/GI PRX ordered and assessed TRANSFUSIONS AS NEEDED MONITOR FSBS I  Assessed the need for Labs I Assessed the need for Foley I Assessed the need for Central Venous Line Family Discussion when available I Assessed the need for Mobilization I made an Assessment of medications to be adjusted accordingly Safety Risk assessment completed   CASE DISCUSSED IN MULTIDISCIPLINARY ROUNDS WITH ICU TEAM  Critical Care Time devoted to patient care services described in this note is 65 minutes.   Overall, patient is critically ill, prognosis is guarded.  Patient with Multiorgan failure and at high risk for cardiac arrest and death.     Patient suffering and dying from COVID 55 pneumonia and stroke, with progressive multiorgan failure , patient is active dying process Family called to bedside   Lucie Leather, M.D.  [facility]  Medical Director [facility] Medical Director [facility] Cardio-Pulmonary Department

## 2020-12-28 NOTE — Progress Notes (Signed)
[facility] Liaison note:  Patient is currently followed by [employer] community Palliative program at home. TOC Teresita Maxey made aware. Dayna Barker BSN, RN, [facility] [employer] [phone]

## 2020-12-28 NOTE — Progress Notes (Signed)
GOALS OF CARE DISCUSSION  The Clinical status was relayed to family in detail. Son is at bedside  Updated and notified of patients medical condition.  Patient remains unresponsive and will not open eyes to command.    Patient is having a weak cough and struggling to remove secretions.   Patient with increased WOB and using accessory muscles to breathe Explained to family course of therapy and the modalities     Patient with Progressive multiorgan failure with a very high probablity of a very minimal chance of meaningful recovery despite all aggressive and optimal medical therapy. Patient is in the Dying  Process associated with Suffering.  Family understands the situation.  They have consented and agreed to DNR status  Family are satisfied with Plan of action and management. All questions answered  Additional CC time 32 mins   Omeka Holben Santiago Glad, M.D.  [facility]  Medical Director [facility] Medical Director [facility] Cardio-Pulmonary Department

## 2020-12-28 DEATH — deceased

## 2021-01-10 ENCOUNTER — Encounter: Payer: Medicare HMO | Admitting: Hospice and Palliative Medicine

## 2021-11-13 IMAGING — DX DG CHEST 1V PORT
1 series · 1 of 1 positions shown · non-contrast
Comparison: Chest radiograph dated 10/12/2020.

CLINICAL DATA: 85-year-old female with concern for sepsis.

EXAM:
PORTABLE CHEST 1 VIEW

[chest ap]
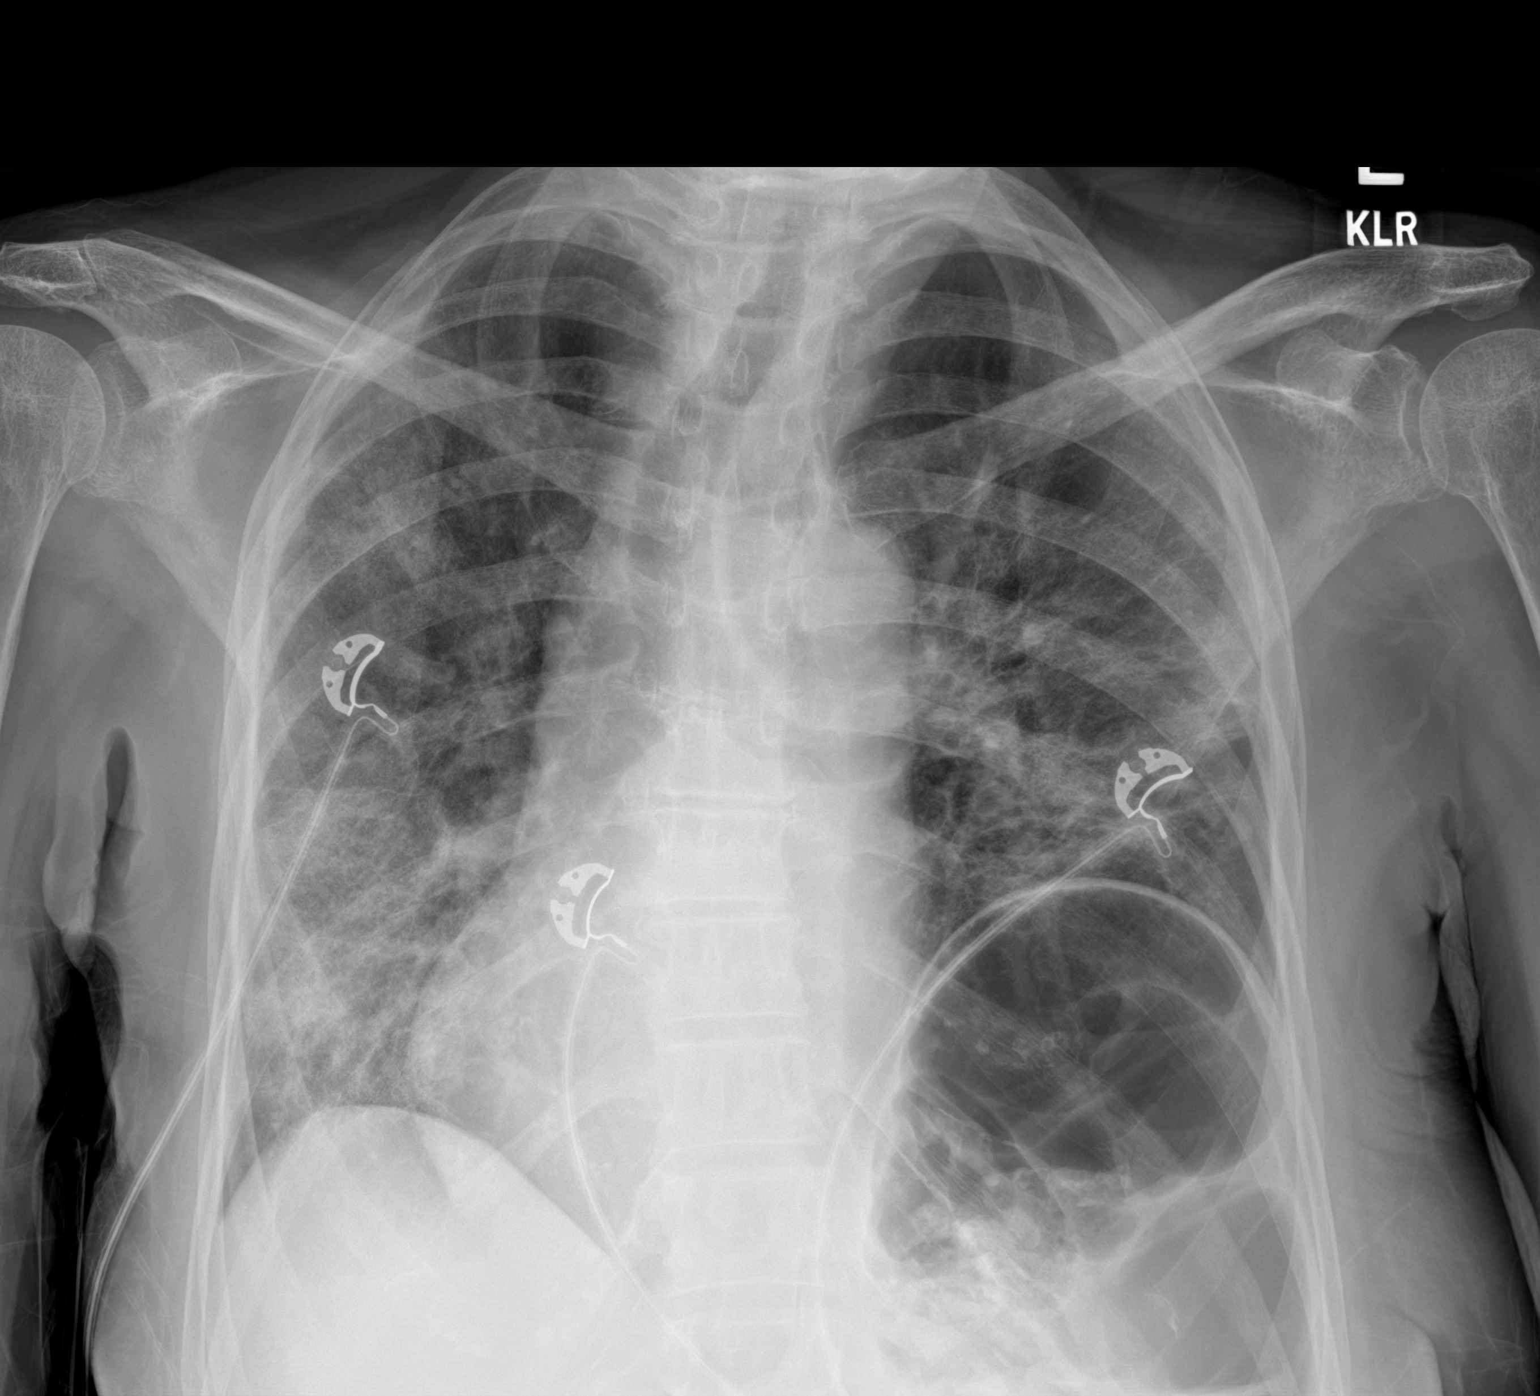

[1 of 1 positions shown; findings below may reference images not displayed]

FINDINGS: Bilateral streaky densities most consistent with pneumonia, possibly
viral or atypical in etiology including [id number]. Clinical
correlation is recommended. No pleural effusion or pneumothorax.
There is mild eventration of the left hemidiaphragm. Stable cardiac
silhouette. No acute osseous pathology.
IMPRESSION: Multifocal pneumonia.

## 2021-11-21 IMAGING — DX DG CHEST 1V PORT
1 series · 1 of 1 positions shown · non-contrast
Comparison: Chest radiograph dated 11/30/2020 and CT dated
11/30/2020

CLINICAL DATA: 86-year-old female with unresponsiveness.

EXAM:
PORTABLE CHEST 1 VIEW

[chest ap]
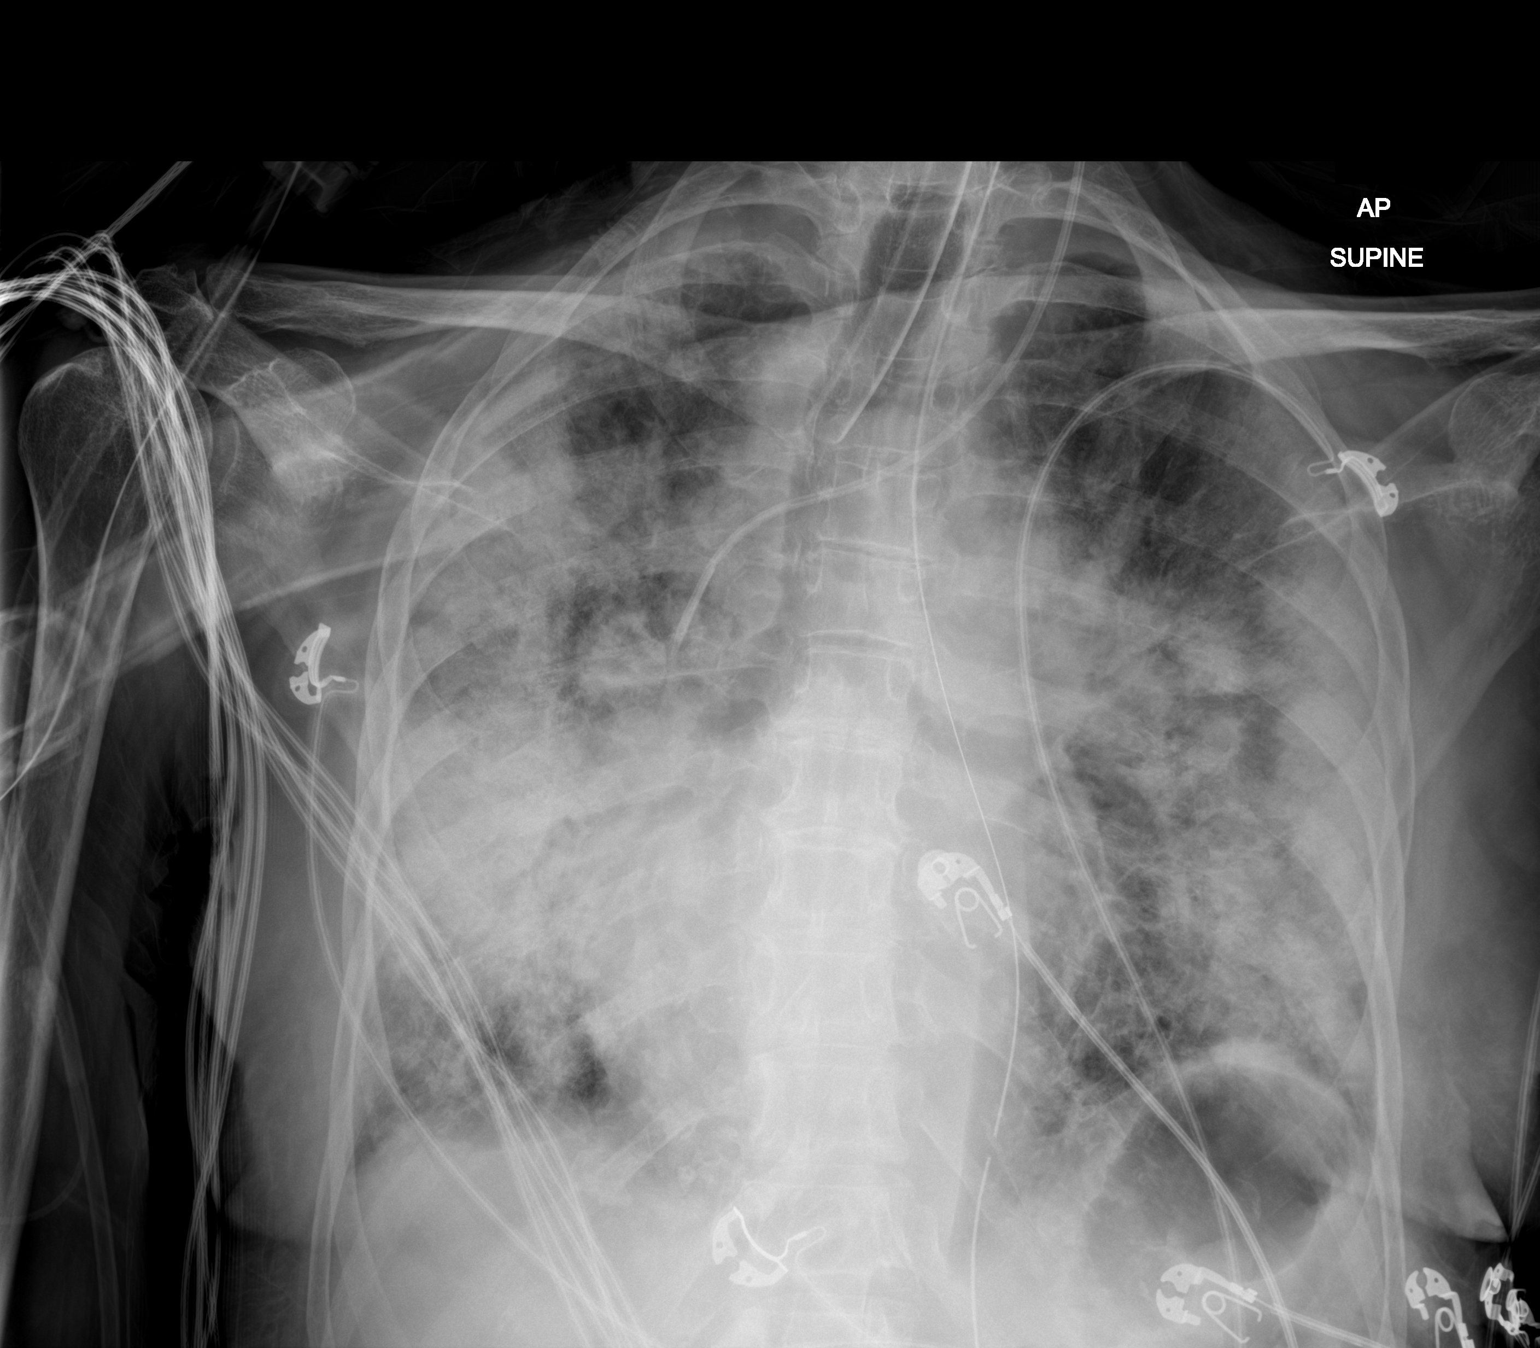

[1 of 1 positions shown; findings below may reference images not displayed]

FINDINGS: Endotracheal tube with tip approximately 5 cm above the carina. Left
IJ central venous line with tip over central SVC. Enteric tube with
side-port above the diaphragm and tip beyond the inferior margin of
the image. Recommend further advancing of the tube by at least
additional 5 cm.

Bilateral pulmonary opacities, progressed since the prior
radiograph. No large pleural effusion. No pneumothorax. Stable
cardiomediastinal silhouette. No acute osseous pathology.
IMPRESSION: 1. Endotracheal tube above the carina.
2. Enteric tube with side-port above the GE junction in the distal
esophagus. Recommend further advancing by at least additional 5 cm.
3. Worsening bilateral pulmonary opacities.

## 2021-11-21 IMAGING — CT CT ANGIO CHEST
2 of 7 series · 18 of 36 positions shown · IV contrast (APPLIED)
Comparison: November 25, 2020.

CLINICAL DATA: PE suspected.  COVID pneumonia.  Unresponsive.

EXAM:
CT ANGIOGRAPHY CHEST WITH CONTRAST
TECHNIQUE: Multidetector CT imaging of the chest was performed using the
standard protocol during bolus administration of intravenous
contrast. Multiplanar CT image reconstructions and MIPs were
obtained to evaluate the vascular anatomy.
CONTRAST:  50mL OMNIPAQUE IOHEXOL 350 MG/ML SOLN

[Series 6: thins · axial · 0.59mm/px · z∈[-555,-287]mm · 15 of 308 slices shown]
[im 20/308  lung]
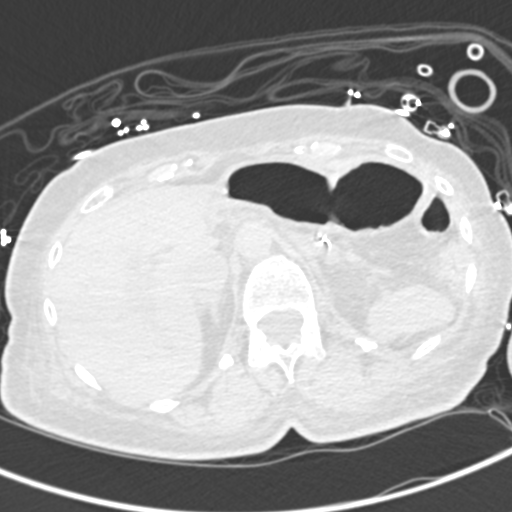
[im 39/308  mediastinal]
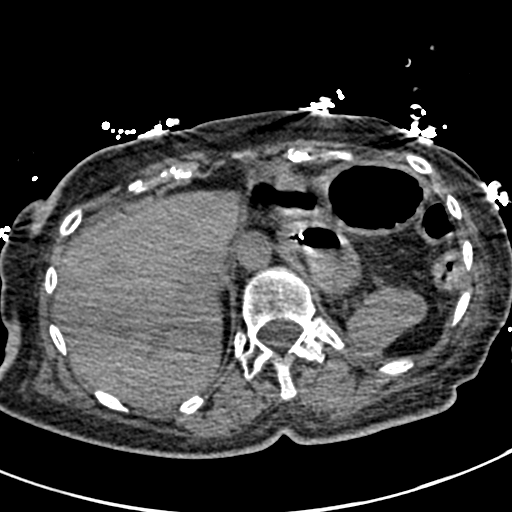
[im 58/308  lung]
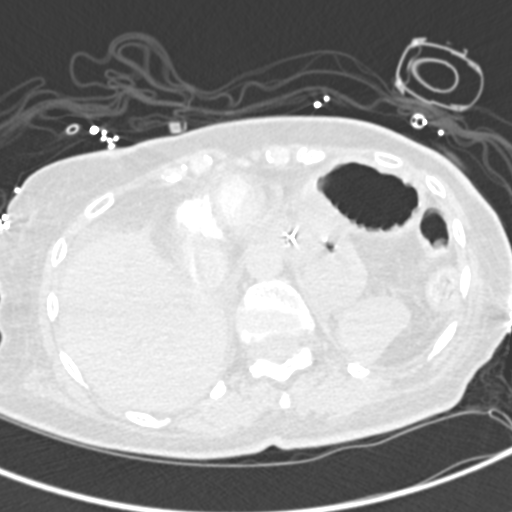
[im 77/308  mediastinal]
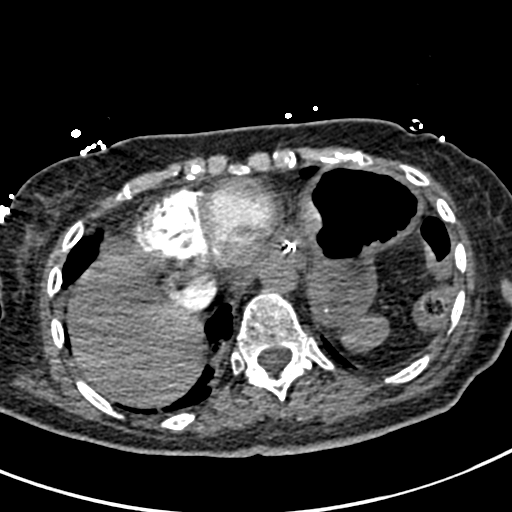
[im 96/308  lung]
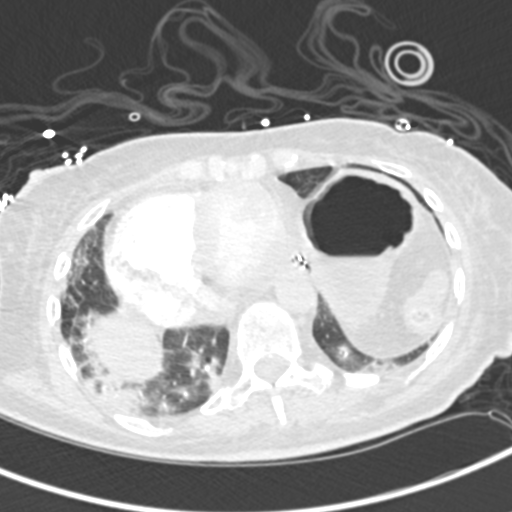
[im 116/308  mediastinal]
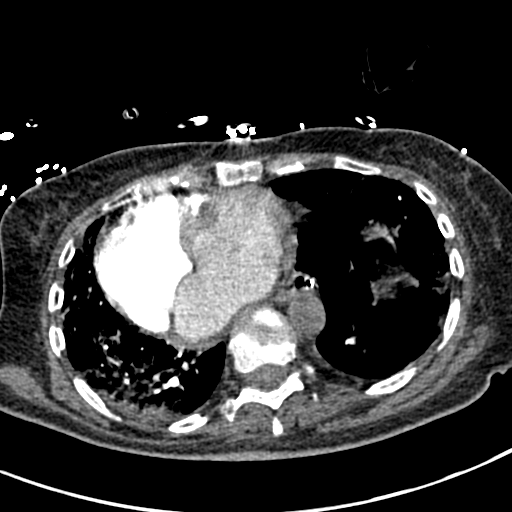
[im 135/308  lung]
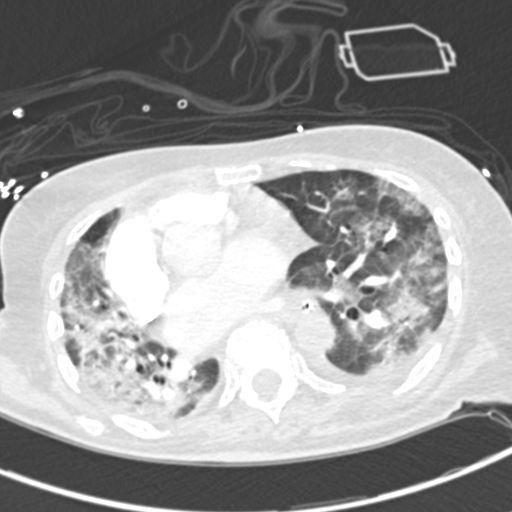
[im 154/308  mediastinal]
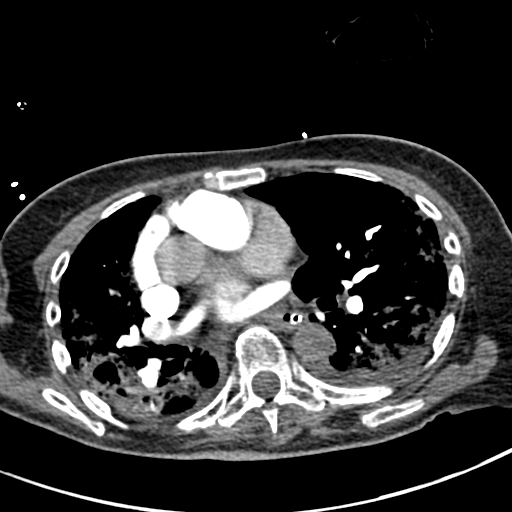
[im 173/308  lung]
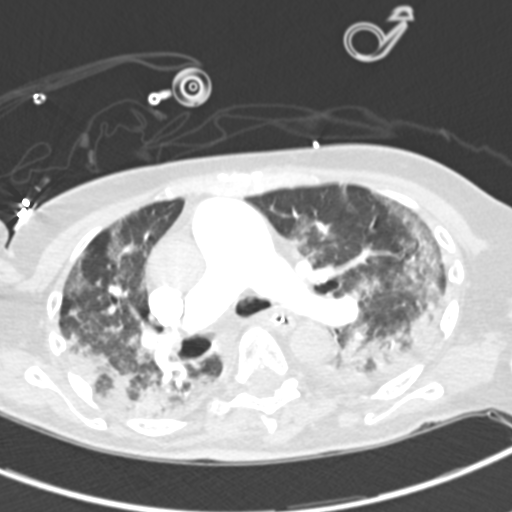
[im 192/308  mediastinal]
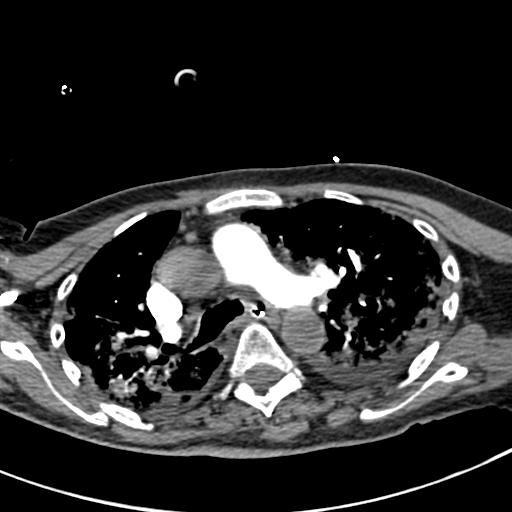
[im 212/308  lung]
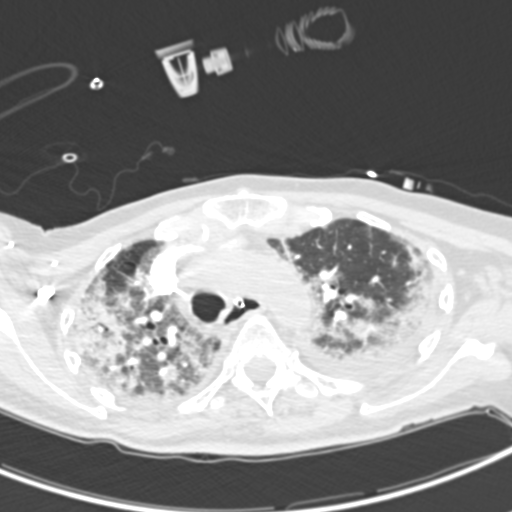
[im 231/308  mediastinal]
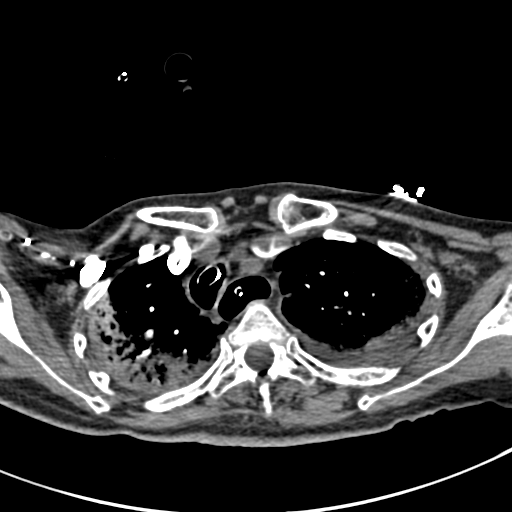
[im 250/308  lung]
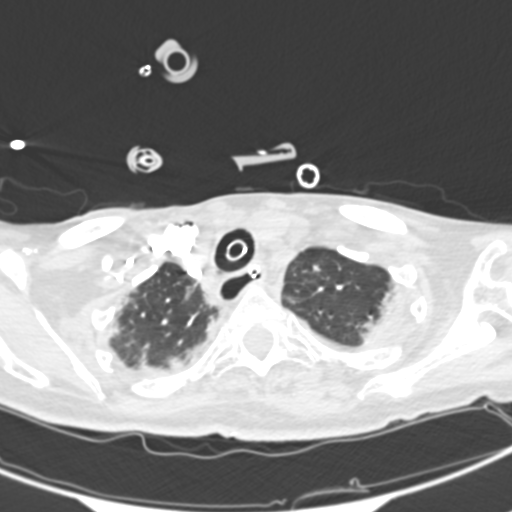
[im 269/308  mediastinal]
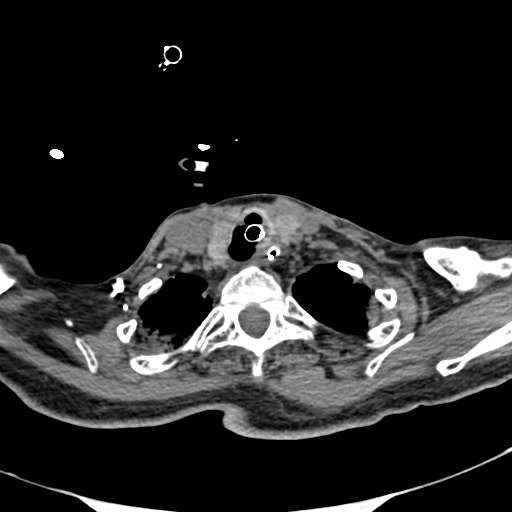
[im 288/308  lung]
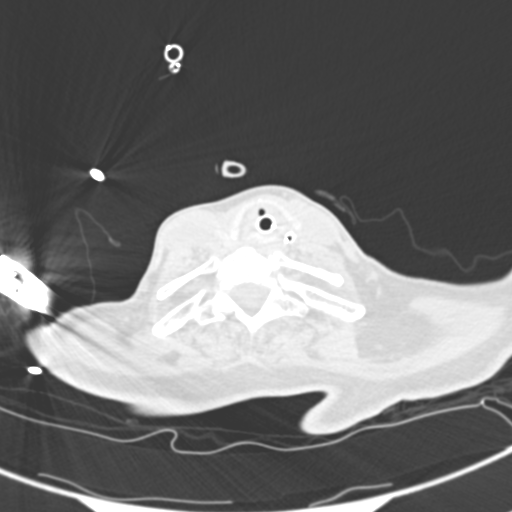

[Series 7: lung · axial · 0.59mm/px · z∈[-456,-330]mm · 3 of 84 slices shown]
[im 21/84  mediastinal]
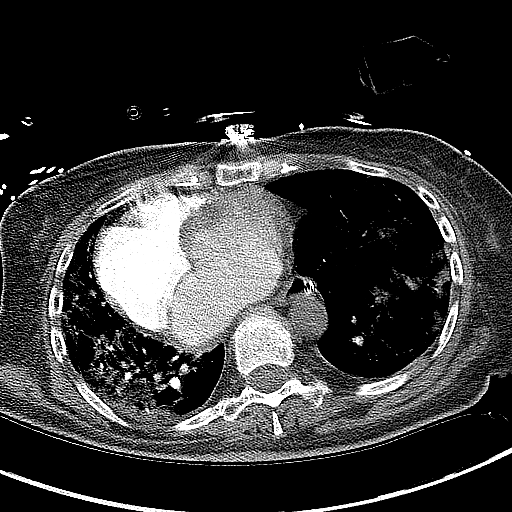
[im 42/84  mediastinal]
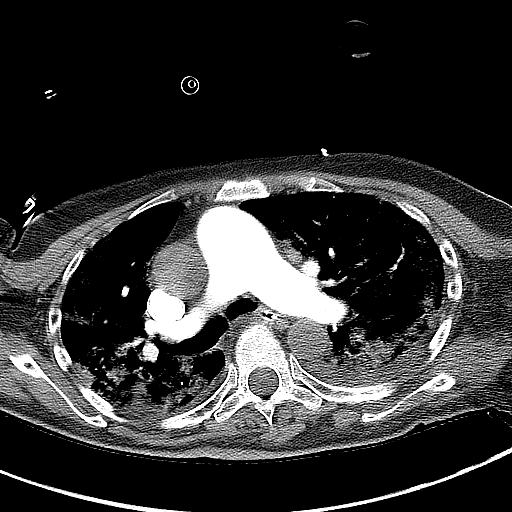
[im 63/84  mediastinal]
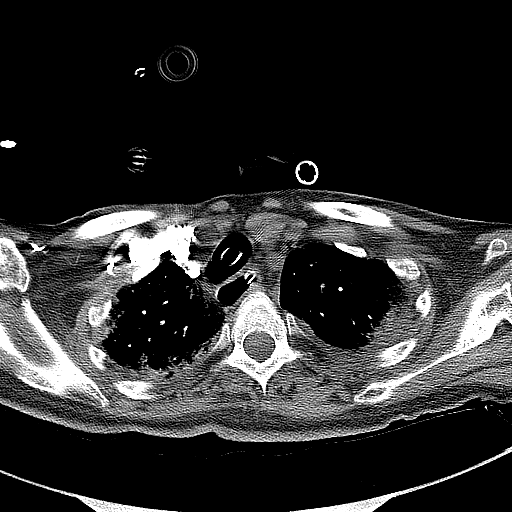

[18 of 36 positions shown; findings below may reference images not displayed]

FINDINGS: Cardiovascular: Contrast injection is sufficient to demonstrate
satisfactory opacification of the pulmonary arteries to the
segmental level. There is no pulmonary embolus or evidence of right
heart strain. The size of the main pulmonary artery is enlarged,
measuring 3.8 cm. Normal heart size with coronary artery
calcification. The course and caliber of the aorta are normal. There
is no atherosclerotic calcification. Opacification decreased due to
pulmonary arterial phase contrast bolus timing.

Mediastinum/Nodes:

-- No mediastinal lymphadenopathy.

-- No hilar lymphadenopathy.

-- No axillary lymphadenopathy.

-- No supraclavicular lymphadenopathy.

-- Normal thyroid gland where visualized.

-the enteric tube tip terminates in the proximal stomach.

Lungs/Pleura: There are worsening bilateral airspace opacities and
consolidation. There are small bilateral pleural effusions. No
pneumothorax. The trachea is unremarkable. The endotracheal tube
terminates above the carina.

Upper Abdomen: Contrast bolus timing is not optimized for evaluation
of the abdominal organs. The visualized portions of the organs of
the upper abdomen are normal.

Musculoskeletal: No chest wall abnormality. No bony spinal canal
stenosis.

Review of the MIP images confirms the above findings.
IMPRESSION: 1. No acute pulmonary embolism.
2. Worsening bilateral airspace opacities and consolidation with
small bilateral pleural effusions.
3. Enlarged size of the main pulmonary artery, which can be seen in
patients with elevated pulmonary artery pressures.
